# Patient Record
Sex: Male | Born: 1955
Health system: Southern US, Community
[De-identification: ages and names within clinical notes are randomized; demographics above are authoritative.]

## PROBLEM LIST (undated history)

## (undated) DIAGNOSIS — I519 Heart disease, unspecified: Secondary | ICD-10-CM

## (undated) DIAGNOSIS — R918 Other nonspecific abnormal finding of lung field: Secondary | ICD-10-CM

## (undated) DIAGNOSIS — K625 Hemorrhage of anus and rectum: Secondary | ICD-10-CM

## (undated) DIAGNOSIS — M170 Bilateral primary osteoarthritis of knee: Secondary | ICD-10-CM

## (undated) DIAGNOSIS — G5622 Lesion of ulnar nerve, left upper limb: Secondary | ICD-10-CM

## (undated) DIAGNOSIS — M109 Gout, unspecified: Secondary | ICD-10-CM

## (undated) DIAGNOSIS — E7800 Pure hypercholesterolemia, unspecified: Secondary | ICD-10-CM

## (undated) DIAGNOSIS — J45909 Unspecified asthma, uncomplicated: Secondary | ICD-10-CM

## (undated) DIAGNOSIS — K219 Gastro-esophageal reflux disease without esophagitis: Secondary | ICD-10-CM

## (undated) DIAGNOSIS — M7020 Olecranon bursitis, unspecified elbow: Secondary | ICD-10-CM

## (undated) DIAGNOSIS — K4020 Bilateral inguinal hernia, without obstruction or gangrene, not specified as recurrent: Secondary | ICD-10-CM

## (undated) DIAGNOSIS — M5410 Radiculopathy, site unspecified: Secondary | ICD-10-CM

## (undated) DIAGNOSIS — M1990 Unspecified osteoarthritis, unspecified site: Secondary | ICD-10-CM

## (undated) DIAGNOSIS — G4700 Insomnia, unspecified: Secondary | ICD-10-CM

## (undated) DIAGNOSIS — E119 Type 2 diabetes mellitus without complications: Secondary | ICD-10-CM

## (undated) DIAGNOSIS — Z87898 Personal history of other specified conditions: Secondary | ICD-10-CM

## (undated) HISTORY — DX: Bilateral primary osteoarthritis of knee: M17.0

## (undated) HISTORY — DX: Heart disease, unspecified: I51.9

## (undated) HISTORY — PX: JOINT REPLACEMENT: SHX530

## (undated) HISTORY — PX: EYE SURGERY: SHX253

## (undated) HISTORY — DX: Insomnia, unspecified: G47.00

## (undated) HISTORY — DX: Olecranon bursitis, unspecified elbow: M70.20

## (undated) HISTORY — DX: Radiculopathy, site unspecified: M54.10

## (undated) HISTORY — DX: Pure hypercholesterolemia, unspecified: E78.00

## (undated) HISTORY — PX: COLONOSCOPY: SHX174

## (undated) HISTORY — DX: Lesion of ulnar nerve, left upper limb: G56.22

## (undated) HISTORY — DX: Other nonspecific abnormal finding of lung field: R91.8

## (undated) HISTORY — DX: Bilateral inguinal hernia, without obstruction or gangrene, not specified as recurrent: K40.20

## (undated) HISTORY — DX: Hemorrhage of anus and rectum: K62.5

## (undated) HISTORY — PX: UPPER GI ENDOSCOPY: SHX6162

---

## 1998-09-29 ENCOUNTER — Emergency Department (HOSPITAL_COMMUNITY): Admission: EM | Admit: 1998-09-29 | Discharge: 1998-09-30 | Payer: Self-pay | Admitting: Emergency Medicine

## 1998-09-29 ENCOUNTER — Encounter: Payer: Self-pay | Admitting: Emergency Medicine

## 1999-02-03 ENCOUNTER — Emergency Department (HOSPITAL_COMMUNITY): Admission: EM | Admit: 1999-02-03 | Discharge: 1999-02-03 | Payer: Self-pay | Admitting: Emergency Medicine

## 1999-02-16 ENCOUNTER — Emergency Department (HOSPITAL_COMMUNITY): Admission: EM | Admit: 1999-02-16 | Discharge: 1999-02-16 | Payer: Self-pay | Admitting: Emergency Medicine

## 2000-12-30 ENCOUNTER — Ambulatory Visit (HOSPITAL_COMMUNITY): Admission: RE | Admit: 2000-12-30 | Discharge: 2000-12-30 | Payer: Self-pay | Admitting: *Deleted

## 2001-08-12 ENCOUNTER — Emergency Department (HOSPITAL_COMMUNITY): Admission: EM | Admit: 2001-08-12 | Discharge: 2001-08-12 | Payer: Self-pay | Admitting: Emergency Medicine

## 2001-08-12 ENCOUNTER — Encounter: Payer: Self-pay | Admitting: Emergency Medicine

## 2002-11-27 ENCOUNTER — Emergency Department (HOSPITAL_COMMUNITY): Admission: EM | Admit: 2002-11-27 | Discharge: 2002-11-27 | Payer: Self-pay | Admitting: Emergency Medicine

## 2003-11-30 ENCOUNTER — Ambulatory Visit: Payer: Self-pay | Admitting: Endocrinology

## 2003-12-03 ENCOUNTER — Ambulatory Visit: Payer: Self-pay | Admitting: Endocrinology

## 2003-12-17 ENCOUNTER — Ambulatory Visit: Payer: Self-pay | Admitting: Gastroenterology

## 2003-12-18 ENCOUNTER — Ambulatory Visit: Payer: Self-pay | Admitting: Endocrinology

## 2003-12-24 ENCOUNTER — Ambulatory Visit: Payer: Self-pay | Admitting: Gastroenterology

## 2005-01-05 ENCOUNTER — Emergency Department (HOSPITAL_COMMUNITY): Admission: EM | Admit: 2005-01-05 | Discharge: 2005-01-05 | Payer: Self-pay | Admitting: Emergency Medicine

## 2005-12-28 ENCOUNTER — Emergency Department (HOSPITAL_COMMUNITY): Admission: EM | Admit: 2005-12-28 | Discharge: 2005-12-29 | Payer: Self-pay | Admitting: Emergency Medicine

## 2007-01-24 ENCOUNTER — Ambulatory Visit: Payer: Self-pay | Admitting: Endocrinology

## 2007-01-24 LAB — CONVERTED CEMR LAB
ALT: 33 units/L (ref 0–53)
AST: 23 units/L (ref 0–37)
Albumin: 4 g/dL (ref 3.5–5.2)
Alkaline Phosphatase: 47 units/L (ref 39–117)
BUN: 14 mg/dL (ref 6–23)
Basophils Absolute: 0 10*3/uL (ref 0.0–0.1)
Basophils Relative: 0.6 % (ref 0.0–1.0)
Bilirubin Urine: NEGATIVE
Bilirubin, Direct: 0.1 mg/dL (ref 0.0–0.3)
CO2: 26 meq/L (ref 19–32)
Calcium: 9.8 mg/dL (ref 8.4–10.5)
Chloride: 107 meq/L (ref 96–112)
Cholesterol: 211 mg/dL (ref 0–200)
Creatinine, Ser: 1 mg/dL (ref 0.4–1.5)
Direct LDL: 106.7 mg/dL
Eosinophils Absolute: 0.2 10*3/uL (ref 0.0–0.6)
Eosinophils Relative: 2.5 % (ref 0.0–5.0)
GFR calc Af Amer: 102 mL/min
GFR calc non Af Amer: 84 mL/min
Glucose, Bld: 94 mg/dL (ref 70–99)
HCT: 43.8 % (ref 39.0–52.0)
HDL: 21.8 mg/dL — ABNORMAL LOW (ref >39.0)
Hemoglobin, Urine: NEGATIVE
Hemoglobin: 15.3 g/dL (ref 13.0–17.0)
Ketones, ur: NEGATIVE mg/dL
Leukocytes, UA: NEGATIVE
Lymphocytes Relative: 28.7 % (ref 12.0–46.0)
MCHC: 34.9 g/dL (ref 30.0–36.0)
MCV: 89.4 fL (ref 78.0–100.0)
Monocytes Absolute: 0.5 10*3/uL (ref 0.2–0.7)
Monocytes Relative: 6.3 % (ref 3.0–11.0)
Neutro Abs: 4.6 10*3/uL (ref 1.4–7.7)
Neutrophils Relative %: 61.9 % (ref 43.0–77.0)
Nitrite: NEGATIVE
Platelets: 302 10*3/uL (ref 150–400)
Potassium: 3.9 meq/L (ref 3.5–5.1)
RBC: 4.9 M/uL (ref 4.22–5.81)
RDW: 12.1 % (ref 11.5–14.6)
Sodium: 140 meq/L (ref 135–145)
Specific Gravity, Urine: >=1.03 (ref 1.000–1.03)
TSH: 2.38 microintl units/mL (ref 0.35–5.50)
Total Bilirubin: 0.5 mg/dL (ref 0.3–1.2)
Total CHOL/HDL Ratio: 9.7
Total Protein, Urine: NEGATIVE mg/dL
Total Protein: 7.5 g/dL (ref 6.0–8.3)
Triglycerides: 537 mg/dL (ref 0–149)
Urine Glucose: NEGATIVE mg/dL
Urobilinogen, UA: 0.2 (ref 0.0–1.0)
VLDL: 107 mg/dL — ABNORMAL HIGH (ref 0–40)
WBC: 7.4 10*3/uL (ref 4.5–10.5)
pH: 5.5 (ref 5.0–8.0)

## 2007-01-26 ENCOUNTER — Ambulatory Visit: Payer: Self-pay | Admitting: Endocrinology

## 2007-01-26 DIAGNOSIS — E7800 Pure hypercholesterolemia, unspecified: Secondary | ICD-10-CM

## 2007-01-26 DIAGNOSIS — I259 Chronic ischemic heart disease, unspecified: Secondary | ICD-10-CM

## 2007-01-26 DIAGNOSIS — M5412 Radiculopathy, cervical region: Secondary | ICD-10-CM | POA: Insufficient documentation

## 2007-01-26 DIAGNOSIS — G4700 Insomnia, unspecified: Secondary | ICD-10-CM

## 2007-02-03 ENCOUNTER — Encounter: Payer: Self-pay | Admitting: Internal Medicine

## 2007-02-03 ENCOUNTER — Ambulatory Visit: Payer: Self-pay

## 2007-03-13 ENCOUNTER — Ambulatory Visit: Payer: Self-pay | Admitting: Cardiology

## 2007-03-13 ENCOUNTER — Inpatient Hospital Stay (HOSPITAL_COMMUNITY): Admission: EM | Admit: 2007-03-13 | Discharge: 2007-03-15 | Payer: Self-pay | Admitting: Emergency Medicine

## 2007-03-13 ENCOUNTER — Ambulatory Visit: Payer: Self-pay | Admitting: Internal Medicine

## 2007-04-12 ENCOUNTER — Encounter: Payer: Self-pay | Admitting: Endocrinology

## 2007-04-25 ENCOUNTER — Ambulatory Visit: Payer: Self-pay | Admitting: Endocrinology

## 2007-04-25 DIAGNOSIS — R079 Chest pain, unspecified: Secondary | ICD-10-CM | POA: Insufficient documentation

## 2007-04-30 ENCOUNTER — Emergency Department (HOSPITAL_COMMUNITY): Admission: EM | Admit: 2007-04-30 | Discharge: 2007-05-01 | Payer: Self-pay | Admitting: Emergency Medicine

## 2007-05-03 ENCOUNTER — Ambulatory Visit: Payer: Self-pay | Admitting: Internal Medicine

## 2007-06-14 ENCOUNTER — Ambulatory Visit: Payer: Self-pay | Admitting: Endocrinology

## 2007-06-15 LAB — CONVERTED CEMR LAB
BUN: 12 mg/dL (ref 6–23)
CO2: 27 meq/L (ref 19–32)
Calcium: 9.7 mg/dL (ref 8.4–10.5)
Chloride: 107 meq/L (ref 96–112)
Creatinine, Ser: 0.9 mg/dL (ref 0.4–1.5)
GFR calc Af Amer: 115 mL/min
GFR calc non Af Amer: 95 mL/min
Glucose, Bld: 122 mg/dL — ABNORMAL HIGH (ref 70–99)
Potassium: 4 meq/L (ref 3.5–5.1)
Sodium: 141 meq/L (ref 135–145)

## 2007-12-13 ENCOUNTER — Encounter: Payer: Self-pay | Admitting: Endocrinology

## 2007-12-22 ENCOUNTER — Encounter: Payer: Self-pay | Admitting: Endocrinology

## 2008-02-14 ENCOUNTER — Encounter: Payer: Self-pay | Admitting: Endocrinology

## 2008-03-30 ENCOUNTER — Encounter: Payer: Self-pay | Admitting: Endocrinology

## 2008-04-26 ENCOUNTER — Ambulatory Visit: Payer: Self-pay | Admitting: Endocrinology

## 2008-04-26 DIAGNOSIS — M109 Gout, unspecified: Secondary | ICD-10-CM

## 2008-04-26 DIAGNOSIS — K625 Hemorrhage of anus and rectum: Secondary | ICD-10-CM

## 2008-04-29 LAB — CONVERTED CEMR LAB
ALT: 47 units/L (ref 0–53)
AST: 42 units/L — ABNORMAL HIGH (ref 0–37)
Alkaline Phosphatase: 56 units/L (ref 39–117)
Basophils Relative: 0.8 % (ref 0.0–3.0)
Bilirubin, Direct: 0 mg/dL (ref 0.0–0.3)
Chloride: 106 meq/L (ref 96–112)
Cholesterol: 171 mg/dL (ref 0–200)
Creatinine, Ser: 1 mg/dL (ref 0.4–1.5)
Direct LDL: 92.6 mg/dL
Eosinophils Relative: 2.7 % (ref 0.0–5.0)
GFR calc non Af Amer: 101.35 mL/min (ref >60)
HDL: 22.5 mg/dL — ABNORMAL LOW (ref >39.00)
Ketones, ur: NEGATIVE mg/dL
Lymphocytes Relative: 28.1 % (ref 12.0–46.0)
MCV: 89.7 fL (ref 78.0–100.0)
Monocytes Absolute: 0.4 10*3/uL (ref 0.1–1.0)
Monocytes Relative: 5.6 % (ref 3.0–12.0)
Neutrophils Relative %: 62.8 % (ref 43.0–77.0)
RBC: 4.57 M/uL (ref 4.22–5.81)
Specific Gravity, Urine: 1.025 (ref 1.000–1.030)
Total Bilirubin: 0.5 mg/dL (ref 0.3–1.2)
Total CHOL/HDL Ratio: 8
Total Protein, Urine: NEGATIVE mg/dL
Total Protein: 7.3 g/dL (ref 6.0–8.3)
Urine Glucose: NEGATIVE mg/dL
VLDL: 77.2 mg/dL — ABNORMAL HIGH (ref 0.0–40.0)
WBC: 7 10*3/uL (ref 4.5–10.5)
pH: 5.5 (ref 5.0–8.0)

## 2008-05-08 ENCOUNTER — Ambulatory Visit: Payer: Self-pay | Admitting: Endocrinology

## 2008-06-07 ENCOUNTER — Encounter: Payer: Self-pay | Admitting: Endocrinology

## 2008-06-26 ENCOUNTER — Encounter (INDEPENDENT_AMBULATORY_CARE_PROVIDER_SITE_OTHER): Payer: Self-pay | Admitting: *Deleted

## 2010-01-25 ENCOUNTER — Emergency Department (HOSPITAL_COMMUNITY)
Admission: EM | Admit: 2010-01-25 | Discharge: 2010-01-25 | Payer: Self-pay | Source: Home / Self Care | Admitting: Emergency Medicine

## 2010-01-28 ENCOUNTER — Encounter: Payer: Self-pay | Admitting: Endocrinology

## 2010-02-09 ENCOUNTER — Encounter: Payer: Self-pay | Admitting: Endocrinology

## 2010-02-20 NOTE — Letter (Signed)
Summary: Sports [facility]  [facility]   Imported By: Sherian Rein 02/05/2010 11:09:43  _____________________________________________________________________  External Attachment:    Type:   Image     Comment:   External Document

## 2010-06-03 NOTE — Discharge Summary (Signed)
David Wall, David Wall                 ACCOUNT NO.:  [id number]   MEDICAL RECORD NO.:  [id number]          PATIENT TYPE:  INP   LOCATION:  1432                         FACILITY:  [facility]   PHYSICIAN:  Rosalyn Gess. Norins, MD  DATE OF BIRTH:  [birth year]   DATE OF ADMISSION:  03/13/2007  DATE OF DISCHARGE:  03/15/2007                               DISCHARGE SUMMARY   ADMITTING DIAGNOSIS:  Atypical chest pain.   DISCHARGE DIAGNOSIS:  Atypical chest pain.   CONSULTANTS:  Dr. Antoine Poche from cardiology.   PROCEDURES:  1. 12-lead EKG which was normal.  2. Two views of the chest performed February 22 which showed no      evidence of acute cardiopulmonary disease.   HISTORY OF PRESENT ILLNESS:  The patient is a 55 year old gentleman who  presented to the emergency department complaining of the onset of chest  pain that began several hours prior to his admission.  He reports the  intensity was 5 on a scale of 10.  The patient reported pain at rest.  Denied exertional chest pain, nausea, vomiting, diaphoresis, shortness  of breath, palpitations, dizziness, lightheadedness, syncope or  indigestion.  The pain was described as sharp located in the substernal  and left anterior chest wall and left upper chest wall.  There was some  radiation to the substernal area.  Because of his ongoing pain and  discomfort he was brought to the emergency department for evaluation and  subsequently admitted to rule out for an MI.  Of note while in the  emergency department his point of care cardiac enzymes were positive  with first cardiac marker with CK of 14.5.  Troponin was less than 0.5.  Second cardiac marker point of care with a CK-MB of 13.9.  Troponin  remaining negative.  The patient had a cardiac panel performed on  February 22 at 1400 hours with a creatinine kinase of 765 with CK-MB of  13.5 with a relative index of 1.8 which was in normal range.  Troponin  was negative.  In addition the patient had  D-dimer 0.24 being negative.  Because of the patient's atypical chest pain and his elevated CK-MB  fraction, I did ask for cardiology consultation.   The patient has a positive family history for heart disease.  He is  obese.  He is a male.  He is not diabetic.  He is not hypertensive.  The  patient did have cardiac catheterization in 2002 which was read out as  showing trivial disease.  He had a nuclear stress test in 2002 which  showed reversible ischemic defect in the inferior wall.  He had a repeat  study in December 2008 which showed similar findings of a reversible  defect in the inferior wall and was unchanged from previous study.  Given the absence of change in recent nuclear stress study, the atypical  nature of his chest pain, his moderate cardiac risk profile, and  clearance by cardiology he was felt at this point to have ruled out and  to be stable for discharge home.   Please see the  Past Medical History, Family History, and Social History  documented in the admit note and consult note.   DISCHARGE PHYSICAL EXAMINATION:  VITAL SIGNS:  Temperature 98.1, blood  pressure 121/72, heart rate 66, respirations 20, O2 sats 96% on room  air.  GENERAL APPEARANCE:  This is an obese African-American gentleman  sitting up in a chair at the time of examination in no distress.  CHEST:  Patient is moving air well with no rales, wheezes or rhonchi.  He has some chest wall discomfort to palpation under the left breast and  to the anterior axillary line.  CARDIOVASCULAR:  The patient had a quiet precordium.  He had a regular  rate and rhythm without murmurs, rubs or gallops.   FINAL LABORATORY STUDIES:  Lipid profile was performed with a  cholesterol 187, triglycerides 304, HDL 20, LDL 106.  Cardiac markers as  noted.  Basic metabolic panel was significant for a serum glucose 118;  otherwise, was normal.  CBC was normal.  The patient had a directly  measured LDL, cholesterol 106.7.    DISCHARGE MEDICATIONS:  The patient will continue on his home medication  of Prilosec 40 mg daily for GERD.   RISK MODIFICATION:  The patient was encouraged to consider weight loss  program as one of his primary risk factors.  His low HDL is an  independent risk factor and can be addressed by his primary care  physician.  His LDL at 106 given his moderate cardiac risk profile is  acceptable.  The patient does have a mildly elevated serum glucose, and  this will be addressed by his primary care physician.   CONDITION AT TIME OF DISCHARGE DICTATION:  Stable and improved.      Rosalyn Gess Norins, MD  Electronically Signed     MEN/MEDQ  D:  03/15/2007  T:  03/15/2007  Job:  [id number]   cc:   Gregary Signs A. Everardo All, MD  [address]  [place]  [place] [address]

## 2010-06-03 NOTE — Consult Note (Signed)
David Wall, David Wall                 ACCOUNT NO.:  [id number]   MEDICAL RECORD NO.:  [id number]          PATIENT TYPE:  INP   LOCATION:  1432                         FACILITY:  [facility]   PHYSICIAN:  Rollene Rotunda, MD, FACCDATE OF BIRTH:  [birth year]   DATE OF CONSULTATION:  03/14/2007  DATE OF DISCHARGE:                                 CONSULTATION   REFERRING PHYSICIAN:  Valetta Mole. Swords, MD.   REASON FOR CONSULTATION:  Evaluate patient with chest pain.   HISTORY OF PRESENT ILLNESS:  The patient is a pleasant, 55 year old,  African American gentleman.  He had been doing well.  He did do a lot of  screaming this weekend at his son's basketball game.  Sunday, he went to  the bathroom.  When he was done, he had developed some sharp chest  discomfort.  He waited about 30 minutes to see if it would go away.  It  was worse with deep breathing.  It was associated with mild shortness of  breath that radiated somewhat to his back and left chest.  It was worse  with movement.  He was driven to the ER.  He would have the pain  somewhat improved with nitroglycerin, but it would come back.  It is  sharp.  He may have had this intermittently at times in the past.  He  was noted not to have any acute EKG changes.  However, he was noted to  have an elevated CK with peak thus far 4074, however, his MB is only  14.5.  Troponins x3 have been negative.  D-dimer was negative.  He  continues to have this discomfort with movement and deep breathing, but  there has been no fevers, chills or cough.  Prior to this, he had not  been having any chest discomfort, neck or arm discomfort.  He did not  complain of any palpitation, presyncope or syncope.  He had been having  no PND or orthopnea.   Of note, the patient had a cardiac catheterization in 2002, with trivial  coronary disease.  Prior to that, he had a stress perfusion study  suggesting a mild reversible inferior defect.  He had another stress  perfusion  study prior to his plans to exercise.  That was this January.  There was no change from the previous Cardiolite.  There was a  suggestion of an inferior reversible defect.  He had a well-preserved  ejection fraction.  Again, this was commented on that this was the same  appearance that he had at the time of this catheterization with  nonobstructive disease.  He was managed medically.   PAST MEDICAL HISTORY:  1. Gastroesophageal reflux disease.  2. Obesity.   PAST SURGICAL HISTORY:  None.   ALLERGIES:  PENICILLIN AND ASPIRIN.   MEDICATIONS:  Protonix, low-molecular heparin, metoprolol 25 mg b.i.d.   SOCIAL HISTORY:  The patient lives in [place] with his wife.  He  works in [employer].  He is married.  He does not smoke cigarettes.  Rarely drinks alcohol.   FAMILY HISTORY:  Contributory for his father  dying of myocardial  function at age 32.   REVIEW OF SYSTEMS:  As stated in HPI and negative for all other systems.   PHYSICAL EXAMINATION:  GENERAL:  The patient is well-appearing in no  distress.  VITAL SIGNS:  Blood pressure 112/74, heart rate 67 and regular,  afebrile.  HEENT:  Eyelids unremarkable, pupils equal, round, reactive to light,  fundi not visualized, oral mucosa unremarkable.  NECK:  No jugular venous distention at 45 degrees, carotid upstroke  brisk and symmetric.  No bruits, no thyromegaly.  LYMPHATICS:  No cervical, axillary or inguinal adenopathy.  LUNGS:  Clear to auscultation bilaterally.  BACK:  No costovertebral mass.  CHEST:  Unremarkable.  HEART:  PMI not displaced or sustained, S1 and S2 within normal limits,  no S3, S4, no clicks, rubs, no murmurs.  ABDOMEN:  Obese, positive bowel sounds, normal in frequency and pitch,  no bruits, rebound, guarding or midline pulsatile mass, no hepatomegaly  or splenomegaly.  SKIN:  No rashes, no nodules.  EXTREMITIES:  2+ pulses throughout, no edema, no cyanosis or clubbing.  NEUROLOGIC:  Oriented to person,  place and time.  Cranial nerves 2-12  grossly intact, motor grossly intact throughout.   EKG sinus rhythm, rate 62, axis within normal limits, early transition  in lead V2, no acute ST and T-wave changes.   ASSESSMENT/RECOMMENDATIONS:  1. The patient has chest discomfort that is extremely atypical for a      cardiac etiology.  It is reproducible with movement, deep      inspiration and cough.  It is reproducible slightly with palpation.      He has an elevated CK, but his MB ratio is very low.  Troponins      have been negative.  EKG is nonacute.  Therefore, based on this, no      further cardiovascular testing is suggested.  He did have the      mildly abnormal Cardiolite in the past, however, this was managed      medically and not change from previous.  Therefore, I would not      suggest that he needs further invasive evaluation.  2. Dyslipidemia, per his primary care team.  Given his family history,      I would aggressively manage this and try for an LDL less than 100      and HDL greater than 40.  3. Obesity.  I had a long discussion about the need to lose weight      with diet and exercise.  I suggested the [employer] and      calorie counting.  4. Reflux.  He is continuing his proton pump inhibitor.  5. Elevated CK.  He must have had some kind of a muscular trauma or      inflammation that would go together with the symptoms and the      elevated CK.  The etiology is not clear.  I will defer to his      primary care doctors.      Rollene Rotunda, MD, [facility]  Electronically Signed     JH/MEDQ  D:  03/14/2007  T:  03/15/2007  Job:  [id number]

## 2010-06-06 NOTE — Cardiovascular Report (Signed)
[place]  Patient:    David Wall, David Wall Visit Number: [id number] MRN: [id number]          Service Type: Attending:  Veneda Melter, M.D. [facility] Dictated by:   Veneda Melter, M.D. [facility] Proc. Date: 12/30/00   CC:         Sonda Primes, M.D. [facility]   Cardiac Catheterization  PROCEDURES PERFORMED: 1. Left heart catheterization. 2. Left ventriculogram. 3. Selective coronary angiography. 4. Perclose, right femoral artery.  DIAGNOSES: 1. Trivial coronary artery disease by angiogram. 2. Normal left ventricular systolic function.  HISTORY:  David Wall is a 55 year old black gentleman with a one-year history of substernal chest discomfort.  This has been associated with shortness of breath.  He underwent stress imaging study suggesting ischemia in the basal and mid inferior wall.  He presents now for further assessment.  DESCRIPTION OF PROCEDURE:  Informed consent was obtained.  The patient was brought to the catheterization lab.  A 6-French sheath was placed in the right femoral artery.  Left heart catheterization, left ventriculogram, and selective coronary angiography were then performed in the usual fashion using preformed 6-French Judkins catheters.  At the termination of the case, the Perclose suture closure device was deployed to the right femoral artery. Adequate hemostasis was achieved and the patient was transferred to the floor in stable condition.  He tolerated the procedure well.  Findings are as follows:  FINDINGS: 1. Left main trunk:  Medium caliber vessel, angiographically normal. 2. LAD:  This is a large caliber vessel that provides two major diagonal    branches and several small diagonal branches in the mid section.  The LAD    has mild irregularities of 20% in the mid section. 3. Left circumflex artery:  Medium caliber vessel that provides a large first    marginal branch in the mid section and a smaller second marginal branch    distally.   The left circumflex system has luminal irregularities. 4. Right coronary artery:  Dominant.  This is a medium caliber vessel that    provides a small posterior descending artery and a posterior ventricular    branch in its terminal segment.  There are mild irregularities of 20% in    the mid right coronary artery. 5. LV:  Normal end systolic and end diastolic dimensions.  Overall left    ventricular function is well preserved.  Ejection fraction is greater than    60%.  No mitral regurgitation.  LV pressure 117/10.  Aortic was 117/82.    LVEDP equals 20.  ASSESSMENT AND PLAN:  David Wall is a 55 year old gentleman with trivial coronary artery disease and well-preserved left ventricular function.  Medical therapy and risk factor modification for the patients coronary disease will be pursued and other causes of chest pain investigated. Dictated by:   Veneda Melter, M.D. [facility] Attending:  Veneda Melter, M.D. [facility] DD:  12/30/00 TD:  12/30/00 Job: [id number]

## 2010-10-10 LAB — BASIC METABOLIC PANEL
BUN: 20
Calcium: 8.9
Chloride: 105
Creatinine, Ser: 1.11
GFR calc Af Amer: >60
GFR calc non Af Amer: >60

## 2010-10-10 LAB — CK TOTAL AND CKMB (NOT AT ARMC)
CK, MB: 13.5 — ABNORMAL HIGH
Total CK: 765 — ABNORMAL HIGH

## 2010-10-10 LAB — LIPID PANEL
Cholesterol: 187
HDL: 20 — ABNORMAL LOW
LDL Cholesterol: 106 — ABNORMAL HIGH
Total CHOL/HDL Ratio: 9.4
Triglycerides: 304 — ABNORMAL HIGH
VLDL: 61 — ABNORMAL HIGH

## 2010-10-10 LAB — DIFFERENTIAL
Eosinophils Absolute: 0.2
Lymphocytes Relative: 19
Lymphs Abs: 1.3
Monocytes Relative: 7
Neutro Abs: 4.9
Neutrophils Relative %: 71

## 2010-10-10 LAB — CBC
Platelets: 257
RBC: 4.37
WBC: 6.9

## 2010-10-10 LAB — POCT CARDIAC MARKERS
CKMB, poc: 13.9
CKMB, poc: 14.5
Myoglobin, poc: 155
Myoglobin, poc: 205
Operator id: 4074
Operator id: 4074
Troponin i, poc: <0.05
Troponin i, poc: <0.05

## 2010-10-10 LAB — PROTIME-INR
INR: 1.1
Prothrombin Time: 14.6

## 2010-10-10 LAB — APTT: aPTT: 43 — ABNORMAL HIGH

## 2010-10-10 LAB — D-DIMER, QUANTITATIVE: D-Dimer, Quant: 0.24

## 2010-10-10 LAB — TROPONIN I: Troponin I: 0.01

## 2010-10-14 LAB — DIFFERENTIAL
Basophils Absolute: 0.1
Basophils Relative: 1
Eosinophils Absolute: 0.2
Eosinophils Relative: 2
Neutrophils Relative %: 69

## 2010-10-14 LAB — COMPREHENSIVE METABOLIC PANEL
ALT: 44
AST: 34
CO2: 24
Chloride: 104
GFR calc Af Amer: >60
GFR calc non Af Amer: >60
Glucose, Bld: 100 — ABNORMAL HIGH
Sodium: 136
Total Bilirubin: 0.7

## 2010-10-14 LAB — CK TOTAL AND CKMB (NOT AT ARMC)
CK, MB: 13 — ABNORMAL HIGH
Relative Index: 2.4

## 2010-10-14 LAB — TROPONIN I: Troponin I: 0.01

## 2010-10-14 LAB — CBC
Hemoglobin: 12.8 — ABNORMAL LOW
MCV: 89.3
RBC: 4.14 — ABNORMAL LOW
WBC: 8.7

## 2010-10-14 LAB — D-DIMER, QUANTITATIVE: D-Dimer, Quant: 0.28

## 2010-10-14 LAB — POCT CARDIAC MARKERS: CKMB, poc: 10.2

## 2012-10-24 ENCOUNTER — Emergency Department (HOSPITAL_COMMUNITY)
Admission: EM | Admit: 2012-10-24 | Discharge: 2012-10-24 | Disposition: A | Discharge status: Home / Self Care | Payer: 59 | Attending: Emergency Medicine | Admitting: Emergency Medicine

## 2012-10-24 ENCOUNTER — Emergency Department (HOSPITAL_COMMUNITY): Payer: 59

## 2012-10-24 ENCOUNTER — Encounter (HOSPITAL_COMMUNITY): Payer: Self-pay | Admitting: *Deleted

## 2012-10-24 DIAGNOSIS — M25422 Effusion, left elbow: Secondary | ICD-10-CM

## 2012-10-24 DIAGNOSIS — Z880 Allergy status to penicillin: Secondary | ICD-10-CM | POA: Insufficient documentation

## 2012-10-24 DIAGNOSIS — M25522 Pain in left elbow: Secondary | ICD-10-CM

## 2012-10-24 DIAGNOSIS — M25429 Effusion, unspecified elbow: Secondary | ICD-10-CM | POA: Insufficient documentation

## 2012-10-24 DIAGNOSIS — Z8639 Personal history of other endocrine, nutritional and metabolic disease: Secondary | ICD-10-CM | POA: Insufficient documentation

## 2012-10-24 DIAGNOSIS — Z862 Personal history of diseases of the blood and blood-forming organs and certain disorders involving the immune mechanism: Secondary | ICD-10-CM | POA: Insufficient documentation

## 2012-10-24 LAB — CBC WITH DIFFERENTIAL/PLATELET
Basophils Relative: 0 % (ref 0–1)
Eosinophils Absolute: 0.1 10*3/uL (ref 0.0–0.7)
Eosinophils Relative: 1 % (ref 0–5)
HCT: 36.7 % — ABNORMAL LOW (ref 39.0–52.0)
Hemoglobin: 12.8 g/dL — ABNORMAL LOW (ref 13.0–17.0)
Lymphs Abs: 1.6 10*3/uL (ref 0.7–4.0)
MCH: 30.5 pg (ref 26.0–34.0)
MCHC: 34.9 g/dL (ref 30.0–36.0)
MCV: 87.6 fL (ref 78.0–100.0)
Monocytes Absolute: 0.6 10*3/uL (ref 0.1–1.0)
Monocytes Relative: 5 % (ref 3–12)
Neutrophils Relative %: 78 % — ABNORMAL HIGH (ref 43–77)
RBC: 4.19 MIL/uL — ABNORMAL LOW (ref 4.22–5.81)

## 2012-10-24 LAB — SYNOVIAL CELL COUNT + DIFF, W/ CRYSTALS
Crystals, Fluid: NONE SEEN
Eosinophils-Synovial: 0 % (ref 0–1)
Neutrophil, Synovial: 91 % — ABNORMAL HIGH (ref 0–25)
WBC, Synovial: 37600 /mm3 — ABNORMAL HIGH (ref 0–200)

## 2012-10-24 LAB — BASIC METABOLIC PANEL
BUN: 17 mg/dL (ref 6–23)
CO2: 21 mEq/L (ref 19–32)
Calcium: 8.8 mg/dL (ref 8.4–10.5)
Creatinine, Ser: 0.92 mg/dL (ref 0.50–1.35)
GFR calc non Af Amer: >90 mL/min (ref >90)
Glucose, Bld: 123 mg/dL — ABNORMAL HIGH (ref 70–99)
Sodium: 138 mEq/L (ref 135–145)

## 2012-10-24 LAB — GRAM STAIN

## 2012-10-24 MED ORDER — HYDROCODONE-ACETAMINOPHEN 5-325 MG PO TABS
2.0000 | ORAL_TABLET | Freq: Once | ORAL | Status: AC
  Administered 2012-10-24: 2 via ORAL
  Filled 2012-10-24: qty 2

## 2012-10-24 MED ORDER — ALLOPURINOL 100 MG PO TABS: 100.0000 mg | ORAL_TABLET | Freq: Two times a day (BID) | ORAL | Status: DC

## 2012-10-24 MED ORDER — PREDNISONE 20 MG PO TABS
60.0000 mg | ORAL_TABLET | Freq: Once | ORAL | Status: DC
  Filled 2012-10-24: qty 3

## 2012-10-24 MED ORDER — LIDOCAINE HCL (PF) 1 % IJ SOLN
30.0000 mL | Freq: Once | INTRAMUSCULAR | Status: DC
  Filled 2012-10-24: qty 30

## 2012-10-24 MED ORDER — COLCHICINE 0.6 MG PO TABS: 0.6000 mg | ORAL_TABLET | Freq: Two times a day (BID) | ORAL | Status: DC

## 2012-10-24 MED ORDER — HYDROCODONE-ACETAMINOPHEN 5-325 MG PO TABS: 2.0000 | ORAL_TABLET | ORAL | Status: DC | PRN

## 2012-10-24 NOTE — ED Notes (Signed)
MD at Bedside.

## 2012-10-24 NOTE — ED Provider Notes (Signed)
CSN: [id number]     Arrival date & time 10/24/12  0212 History   First MD Initiated Contact with Patient 10/24/12 [phone]     Chief Complaint  Patient presents with  . Arm Pain   (Consider location/radiation/quality/duration/timing/severity/associated sxs/prior Treatment) HPI Comments: 57 yo male with gout hx, non smoker presents with left elbow pain and swelling since this am, constant, ache, worse with movement, no hx of gout in elbow.  No fevers or warmth.  No DM hx.  No other joints involved.  No injuries.   Patient is a 57 y.o. male presenting with arm pain. The history is provided by the patient.  Arm Pain This is a new problem. Pertinent negatives include no chest pain, no abdominal pain, no headaches and no shortness of breath.    History reviewed. No pertinent past medical history. History reviewed. No pertinent past surgical history. History reviewed. No pertinent family history. History  Substance Use Topics  . Smoking status: Never Smoker   . Smokeless tobacco: Not on file  . Alcohol Use: Yes    Review of Systems  Constitutional: Negative for fever and chills.  HENT: Negative for neck pain.   Respiratory: Negative for shortness of breath.   Cardiovascular: Negative for chest pain.  Gastrointestinal: Negative for vomiting and abdominal pain.  Genitourinary: Negative for dysuria and flank pain.  Musculoskeletal: Positive for joint swelling and arthralgias. Negative for back pain.  Skin: Negative for rash.  Neurological: Negative for light-headedness and headaches.    Allergies  Amoxicillin; Aspirin; and Penicillins  Home Medications   Current Outpatient Rx  Name  Route  Sig  Dispense  Refill  . ibuprofen (ADVIL,MOTRIN) 200 MG tablet   Oral   Take 800 mg by mouth every 6 (six) hours as needed for pain.          BP 134/77  Pulse 80  Temp(Src) 98.7 F (37.1 C) (Oral)  Resp 16  SpO2 98% Physical Exam  Nursing note and vitals reviewed. Constitutional: He  is oriented to person, place, and time. He appears well-developed and well-nourished.  HENT:  Head: Normocephalic and atraumatic.  Eyes: Conjunctivae are normal. Right eye exhibits no discharge. Left eye exhibits no discharge.  Neck: Normal range of motion. Neck supple. No tracheal deviation present.  Cardiovascular: Normal rate and regular rhythm.   Pulmonary/Chest: Effort normal.  Abdominal: Soft.  Musculoskeletal: He exhibits edema and tenderness.  Tender and moderate swelling left elbow, no warmth or erythema or  induration, nv intact distal, pain with flexion No other major joints swollen  Neurological: He is alert and oriented to person, place, and time.  Skin: Skin is warm. No rash noted.  Psychiatric: He has a normal mood and affect.    ED Course  Fine needle aspiration Date/Time: 10/24/2012 6:40 AM Performed by: Enid Skeens Authorized by: Enid Skeens Consent: Verbal consent obtained. Consent given by: patient Patient identity confirmed: verbally with patient Local anesthesia used: yes Anesthesia: local infiltration Local anesthetic: lidocaine 1% without epinephrine Anesthetic total: 8 ml Patient sedated: no Comments: Left elbow joint aspiration, 8 ml obtained, yellow fluid   (including critical care time)  Left Elbow Joint aspiration Lidocaine 8 cc used. Region sterilized with betadine.  US guidances used. 10 cc of yellow fluid obtained.  Sent to lab EMERGENCY DEPARTMENT [place] MUSCULOSKELETAL  Left Elbow Indication Joint effusion/ pain Findings moderate joint effusion with linear probe Images saved  Emergency Ultrasound:  US Guidance for needle guidance  Performed by  Dr. Jodi Mourning Indication: left elbow effusion/ aspiration Linear probe used in real-time to visualize location of needle entry through skin. Interpretation: needle visualized going into joint effusion Image archived electronically.   Labs Review Labs Reviewed  SYNOVIAL CELL COUNT +  DIFF, W/ CRYSTALS - Abnormal; Notable for the following:    Color, Synovial RED (*)    Appearance-Synovial TURBID (*)    WBC, Synovial 16109 (*)    Neutrophil, Synovial 91 (*)    Monocyte-Macrophage-Synovial Fluid 9 (*)    All other components within normal limits  CBC WITH DIFFERENTIAL - Abnormal; Notable for the following:    RBC 4.19 (*)    Hemoglobin 12.8 (*)    HCT 36.7 (*)    Neutrophils Relative % 78 (*)    Neutro Abs 8.1 (*)    All other components within normal limits  BASIC METABOLIC PANEL - Abnormal; Notable for the following:    Glucose, Bld 123 (*)    All other components within normal limits  GRAM STAIN  BODY FLUID CULTURE  BODY FLUID CRYSTAL  SEDIMENTATION RATE   Imaging Review No results found.  MDM  No diagnosis found. Concern for gout vs less likely infectious. Xray and bedside US. Pt had motrin pta, norco given.  Pain improved, xray no fx, joint effusion, reviewed.  Discussed r/b of joint aspiration to look for gout vs infectious, pt understands and wishes to proceed. US guidance utilized, two attempts, sterile technique. Fluid sent to lab, yellow color. Labs pending.   Spoke with ortho Dr Lajoyce Corners, low suspicion for septic joint, he recommended allopurinol/ colchicine and close fup in clinic.  DC Left elbow effusion, left elbow pain, Gout  Enid Skeens, MD 10/24/12 [phone]

## 2012-10-24 NOTE — ED Notes (Signed)
Labs are sent and should be in process

## 2012-10-24 NOTE — ED Notes (Signed)
Patient transported to XR. 

## 2012-10-24 NOTE — ED Notes (Signed)
Patient returned from XR. 

## 2012-10-24 NOTE — ED Notes (Addendum)
Pt states that he started having pain yesterday in his left lower arm. Pt states that he though it was a cramp from sleeping wrong but as the day progressed his pain continued to get worse. Pt states that when he moves his left lower arm his pain is worse. Pt denies pain in left shoulder. CNS intact with left arm. Pt able to wiggle fingers and cap refill less than 3

## 2012-10-27 LAB — BODY FLUID CULTURE: Culture: NO GROWTH

## 2013-01-26 ENCOUNTER — Emergency Department (HOSPITAL_BASED_OUTPATIENT_CLINIC_OR_DEPARTMENT_OTHER)
Admission: EM | Admit: 2013-01-26 | Discharge: 2013-01-26 | Disposition: A | Discharge status: Home / Self Care | Payer: 59 | Attending: Emergency Medicine | Admitting: Emergency Medicine

## 2013-01-26 ENCOUNTER — Encounter (HOSPITAL_BASED_OUTPATIENT_CLINIC_OR_DEPARTMENT_OTHER): Payer: Self-pay | Admitting: Emergency Medicine

## 2013-01-26 ENCOUNTER — Emergency Department (HOSPITAL_BASED_OUTPATIENT_CLINIC_OR_DEPARTMENT_OTHER): Payer: 59

## 2013-01-26 DIAGNOSIS — M7581 Other shoulder lesions, right shoulder: Secondary | ICD-10-CM

## 2013-01-26 DIAGNOSIS — Z880 Allergy status to penicillin: Secondary | ICD-10-CM | POA: Insufficient documentation

## 2013-01-26 DIAGNOSIS — M778 Other enthesopathies, not elsewhere classified: Secondary | ICD-10-CM

## 2013-01-26 DIAGNOSIS — Z79899 Other long term (current) drug therapy: Secondary | ICD-10-CM | POA: Insufficient documentation

## 2013-01-26 DIAGNOSIS — M109 Gout, unspecified: Secondary | ICD-10-CM | POA: Insufficient documentation

## 2013-01-26 DIAGNOSIS — M719 Bursopathy, unspecified: Principal | ICD-10-CM | POA: Insufficient documentation

## 2013-01-26 DIAGNOSIS — M67919 Unspecified disorder of synovium and tendon, unspecified shoulder: Secondary | ICD-10-CM | POA: Insufficient documentation

## 2013-01-26 HISTORY — DX: Gout, unspecified: M10.9

## 2013-01-26 MED ORDER — HYDROCODONE-ACETAMINOPHEN 5-325 MG PO TABS
1.0000 | ORAL_TABLET | Freq: Once | ORAL | Status: AC
Start: 2013-01-26 — End: 2013-01-26
  Administered 2013-01-26: 1 via ORAL
  Filled 2013-01-26: qty 1

## 2013-01-26 MED ORDER — HYDROCODONE-ACETAMINOPHEN 5-325 MG PO TABS: 2.0000 | ORAL_TABLET | Freq: Four times a day (QID) | ORAL | Status: DC | PRN

## 2013-01-26 NOTE — ED Provider Notes (Signed)
CSN: [id number]     Arrival date & time 01/26/13  0906 History   First MD Initiated Contact with Patient 01/26/13 [phone]     Chief Complaint  Patient presents with  . Shoulder Pain   (Consider location/radiation/quality/duration/timing/severity/associated sxs/prior Treatment) Patient is a 58 y.o. male presenting with shoulder pain.  Shoulder Pain   Pt reports 2 days of moderate to severe aching pain in R shoulder, worse with movement, not associated with injury. Felt like he slept on it wrong but not improving at home. Has history of gout but no prior shoulder problems. 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   Past Medical History  Diagnosis Date  . Gout    History reviewed. No pertinent past surgical history. No family history on file. History  Substance Use Topics  . Smoking status: Never Smoker   . Smokeless tobacco: Not on file  . Alcohol Use: Yes    Review of Systems All other systems reviewed and are negative except as noted in HPI.   Allergies  Amoxicillin; Aspirin; and Penicillins  Home Medications   Current Outpatient Rx  Name  Route  Sig  Dispense  Refill  . allopurinol (ZYLOPRIM) 100 MG tablet   Oral   Take 1 tablet (100 mg total) by mouth 2 (two) times daily.   14 tablet   0   . colchicine 0.6 MG tablet   Oral   Take 1 tablet (0.6 mg total) by mouth 2 (two) times daily.   14 tablet   0   . ibuprofen (ADVIL,MOTRIN) 200 MG tablet   Oral   Take 800 mg by mouth every 6 (six) hours as needed for pain.         Marland Kitchen HYDROcodone-acetaminophen (NORCO) 5-325 MG per tablet   Oral   Take 2 tablets by mouth every 4 (four) hours as needed for pain.   10 tablet   0    BP 126/85  Temp(Src) 98.3 F (36.8 C) (Oral)  Resp 20  SpO2 100% Physical Exam  Nursing note and vitals reviewed. Constitutional: He is oriented to person, place, and time. He appears well-developed and well-nourished.  HENT:  Head: Normocephalic and atraumatic.  Eyes: EOM  are normal. Pupils are equal, round, and reactive to light.  Neck: Normal range of motion. Neck supple.  Cardiovascular: Normal rate, normal heart sounds and intact distal pulses.   Pulmonary/Chest: Effort normal and breath sounds normal.  Abdominal: Bowel sounds are normal. He exhibits no distension. There is no tenderness.  Musculoskeletal: He exhibits tenderness (tender over the R AC joint). He exhibits no edema.  Pain with active ROM but less painful with passive ROM  Neurological: He is alert and oriented to person, place, and time. He has normal strength. No cranial nerve deficit or sensory deficit.  Skin: Skin is warm and dry. No rash noted.  Psychiatric: He has a normal mood and affect.    ED Course  Procedures (including critical care time) Labs Review Labs Reviewed - No data to display Imaging Review Dg Shoulder Right  01/26/2013   CLINICAL DATA:  Pain.  EXAM: RIGHT SHOULDER - 2+ VIEW  COMPARISON:  None.  FINDINGS: There is no evidence of fracture or dislocation. There is no evidence of arthropathy or other focal bone abnormality. Soft tissues are unremarkable. Punctate calcified density projects along the course of the distal supraspinous this has a a multislice appearance. Finding may represent sequela of calcific tendinitis etiologies such as  hydroxyapatite deposition also a diagnostic consideration.  IMPRESSION: No evidence of acute osseous abnormalities. Calcific appearing density projecting along the distal aspect of the supraspinatus differential as described above.   Electronically Signed   By: Margaree Mackintosh M.D.   On: 01/26/2013 09:40    EKG Interpretation   None       MDM   1. Tendonitis of shoulder, right     Shoulder pain without bony abnormality. Suspect tendonitis. Advised rest, NSAID, pain meds and sling for comfort. Advised ROM as tolerate to avoid frozen shoulder. Followup with Dr. Barbaraann Barthel.     [name], MD 01/26/13 [phone]

## 2013-01-26 NOTE — ED Notes (Signed)
Patient states he woke up two days ago with pain in his right shoulder.  No known injury.

## 2013-01-26 NOTE — Discharge Instructions (Signed)
Tendinitis °Tendinitis is swelling and inflammation of the tendons. Tendons are band-like tissues that connect muscle to bone. Tendinitis commonly occurs in the:  °· Shoulders (rotator cuff). °· Heels (Achilles tendon). °· Elbows (triceps tendon). °CAUSES °Tendinitis is usually caused by overusing the tendon, muscles, and joints involved. When the tissue surrounding a tendon (synovium) becomes inflamed, it is called tenosynovitis. Tendinitis commonly develops in people whose jobs require repetitive motions. °SYMPTOMS °· Pain. °· Tenderness. °· Mild swelling. °DIAGNOSIS °Tendinitis is usually diagnosed by physical exam. Your caregiver may also order X-rays or other imaging tests. °TREATMENT °Your caregiver may recommend certain medicines or exercises for your treatment. °HOME CARE INSTRUCTIONS  °· Use a sling or splint for as long as directed by your caregiver until the pain decreases. °· Put ice on the injured area. °· Put ice in a plastic bag. °· Place a towel between your skin and the bag. °· Leave the ice on for 15-20 minutes, 03-04 times a day. °· Avoid using the limb while the tendon is painful. Perform gentle range of motion exercises only as directed by your caregiver. Stop exercises if pain or discomfort increase, unless directed otherwise by your caregiver. °· Only take over-the-counter or prescription medicines for pain, discomfort, or fever as directed by your caregiver. °SEEK MEDICAL CARE IF:  °· Your pain and swelling increase. °· You develop new, unexplained symptoms, especially increased numbness in the hands. °MAKE SURE YOU:  °· Understand these instructions. °· Will watch your condition. °· Will get help right away if you are not doing well or get worse. °Document Released: 01/03/2000 Document Revised: 03/30/2011 Document Reviewed: 03/24/2010 °[employer]® Patient Information ©2014 [employer]. ° °

## 2013-01-30 ENCOUNTER — Emergency Department (HOSPITAL_BASED_OUTPATIENT_CLINIC_OR_DEPARTMENT_OTHER): Payer: 59

## 2013-01-30 ENCOUNTER — Encounter (HOSPITAL_BASED_OUTPATIENT_CLINIC_OR_DEPARTMENT_OTHER): Payer: Self-pay | Admitting: Emergency Medicine

## 2013-01-30 ENCOUNTER — Emergency Department (HOSPITAL_BASED_OUTPATIENT_CLINIC_OR_DEPARTMENT_OTHER)
Admission: EM | Admit: 2013-01-30 | Discharge: 2013-01-31 | Disposition: A | Discharge status: Home / Self Care | Payer: 59 | Attending: Emergency Medicine | Admitting: Emergency Medicine

## 2013-01-30 DIAGNOSIS — Z8639 Personal history of other endocrine, nutritional and metabolic disease: Secondary | ICD-10-CM | POA: Insufficient documentation

## 2013-01-30 DIAGNOSIS — M25469 Effusion, unspecified knee: Secondary | ICD-10-CM | POA: Insufficient documentation

## 2013-01-30 DIAGNOSIS — Z862 Personal history of diseases of the blood and blood-forming organs and certain disorders involving the immune mechanism: Secondary | ICD-10-CM | POA: Insufficient documentation

## 2013-01-30 DIAGNOSIS — M25561 Pain in right knee: Secondary | ICD-10-CM

## 2013-01-30 DIAGNOSIS — M25579 Pain in unspecified ankle and joints of unspecified foot: Secondary | ICD-10-CM | POA: Insufficient documentation

## 2013-01-30 DIAGNOSIS — Z880 Allergy status to penicillin: Secondary | ICD-10-CM | POA: Insufficient documentation

## 2013-01-30 LAB — BASIC METABOLIC PANEL
BUN: 10 mg/dL (ref 6–23)
CHLORIDE: 100 meq/L (ref 96–112)
CO2: 25 mEq/L (ref 19–32)
Calcium: 9.4 mg/dL (ref 8.4–10.5)
Creatinine, Ser: 1 mg/dL (ref 0.50–1.35)
GFR calc non Af Amer: 83 mL/min — ABNORMAL LOW (ref >90)
Glucose, Bld: 104 mg/dL — ABNORMAL HIGH (ref 70–99)
POTASSIUM: 4.5 meq/L (ref 3.7–5.3)
SODIUM: 139 meq/L (ref 137–147)

## 2013-01-30 LAB — CBC WITH DIFFERENTIAL/PLATELET
BASOS ABS: 0 10*3/uL (ref 0.0–0.1)
BASOS PCT: 0 % (ref 0–1)
Eosinophils Absolute: 0.1 10*3/uL (ref 0.0–0.7)
Eosinophils Relative: 1 % (ref 0–5)
HCT: 40.6 % (ref 39.0–52.0)
HEMOGLOBIN: 13.4 g/dL (ref 13.0–17.0)
Lymphocytes Relative: 12 % (ref 12–46)
Lymphs Abs: 1.3 10*3/uL (ref 0.7–4.0)
MCH: 29.3 pg (ref 26.0–34.0)
MCHC: 33 g/dL (ref 30.0–36.0)
MCV: 88.8 fL (ref 78.0–100.0)
MONOS PCT: 7 % (ref 3–12)
Monocytes Absolute: 0.8 10*3/uL (ref 0.1–1.0)
NEUTROS ABS: 8.9 10*3/uL — AB (ref 1.7–7.7)
Neutrophils Relative %: 81 % — ABNORMAL HIGH (ref 43–77)
PLATELETS: 306 10*3/uL (ref 150–400)
RBC: 4.57 MIL/uL (ref 4.22–5.81)
RDW: 12 % (ref 11.5–15.5)
WBC: 11 10*3/uL — ABNORMAL HIGH (ref 4.0–10.5)

## 2013-01-30 LAB — SEDIMENTATION RATE: SED RATE: 31 mm/h — AB (ref 0–16)

## 2013-01-30 MED ORDER — OXYCODONE-ACETAMINOPHEN 5-325 MG PO TABS
2.0000 | ORAL_TABLET | Freq: Once | ORAL | Status: AC
  Administered 2013-01-30: 2 via ORAL
  Filled 2013-01-30: qty 2

## 2013-01-30 NOTE — ED Notes (Signed)
Right knee pain x 2 days-denies injury

## 2013-01-30 NOTE — ED Notes (Signed)
Pt states 3 days ago felt a "cramping" feeling in right knee and slipped a bit, but denies falling or known injury to knee.  Pain described as throbbing sharp, constant pain, unrelieved by laying still or limited ROM.  Pt has taken tylenol at home with minimal relief.

## 2013-01-30 NOTE — ED Provider Notes (Signed)
CSN: [id number]     Arrival date & time 01/30/13  2004 History  This chart was scribed for Ephraim Hamburger, MD by Rolanda Lundborg, ED Scribe. This patient was seen in room MH03/MH03 and the patient's care was started at 8:33 PM.    Chief Complaint  Patient presents with  . Knee Pain   The history is provided by the patient. No language interpreter was used.   HPI Comments: David Wall is a 58 y.o. male with a h/o gout who presents to the Emergency Department complaining of gradually-worsening right knee pain with associated swelling onset 4 days ago. Pt denies injury, trauma. He has been taking Tylenol and icing the area. He states he usually gets gout in his foot. He rates the pain as an 8/10 severity. Standing up makes the pain worse. He denies fevers, vomiting.   Past Medical History  Diagnosis Date  . Gout    History reviewed. No pertinent past surgical history. No family history on file. History  Substance Use Topics  . Smoking status: Never Smoker   . Smokeless tobacco: Not on file  . Alcohol Use: Yes    Review of Systems  Constitutional: Negative for fever.  Gastrointestinal: Negative for vomiting.  Musculoskeletal: Positive for arthralgias (right knee).  All other systems reviewed and are negative.    Allergies  Amoxicillin; Aspirin; and Penicillins  Home Medications   Current Outpatient Rx  Name  Route  Sig  Dispense  Refill  . HYDROcodone-acetaminophen (NORCO/VICODIN) 5-325 MG per tablet   Oral   Take 2 tablets by mouth every 6 (six) hours as needed.   30 tablet   0   . ibuprofen (ADVIL,MOTRIN) 200 MG tablet   Oral   Take 800 mg by mouth every 6 (six) hours as needed for pain.          BP 112/75  Pulse 92  Temp(Src) 99.7 F (37.6 C) (Oral)  Resp 20  Ht 5\' 8"  (1.727 m)  Wt 275 lb (124.739 kg)  BMI 41.82 kg/m2  SpO2 96% Physical Exam  Nursing note and vitals reviewed. Constitutional: He is oriented to person, place, and time. He appears  well-developed and well-nourished. No distress.  HENT:  Head: Normocephalic and atraumatic.  Eyes: EOM are normal.  Cardiovascular: Normal rate.   Pulses:      Dorsalis pedis pulses are 2+ on the right side, and 2+ on the left side.  Pulmonary/Chest: Effort normal. No respiratory distress.  Abdominal: He exhibits no distension.  Musculoskeletal:       Right knee: He exhibits decreased range of motion and swelling. He exhibits no erythema. Tenderness found.  Warm knee but no erythema, NV intact distally  Neurological: He is alert and oriented to person, place, and time.  Skin: Skin is warm and dry. No erythema.  Psychiatric: He has a normal mood and affect. His behavior is normal.    ED Course  ARTHOCENTESIS Date/Time: 01/30/2013 10:27 PM Performed by: Sherwood Gambler T Authorized by: Sherwood Gambler T Consent: Verbal consent obtained. written consent obtained. Risks and benefits: risks, benefits and alternatives were discussed Consent given by: patient Patient identity confirmed: verbally with patient and arm band Time out: Immediately prior to procedure a "time out" was called to verify the correct patient, procedure, equipment, support staff and site/side marked as required. Indications: joint swelling, pain and possible septic joint  Body area: knee Joint: right knee Local anesthesia used: yes Local anesthetic: lidocaine 1% with epinephrine Anesthetic  total: 3 ml Patient sedated: no Preparation: Patient was prepped and draped in the usual sterile fashion. Needle gauge: 18 G Approach: lateral Aspirate: yellow Aspirate amount: 20 ml Patient tolerance: Patient tolerated the procedure well with no immediate complications.   (including critical care time) Medications  oxyCODONE-acetaminophen (PERCOCET/ROXICET) 5-325 MG per tablet 2 tablet (2 tablets Oral Given 01/30/13 2105)    DIAGNOSTIC STUDIES: Oxygen Saturation is 96% on RA, normal by my interpretation.     COORDINATION OF CARE: 8:49 PM- Discussed treatment plan with pt which includes x-ray and pain medication. Pt agrees to plan.    Labs Review Labs Reviewed  CBC WITH DIFFERENTIAL - Abnormal; Notable for the following:    WBC 11.0 (*)    Neutrophils Relative % 81 (*)    Neutro Abs 8.9 (*)    All other components within normal limits  BASIC METABOLIC PANEL - Abnormal; Notable for the following:    Glucose, Bld 104 (*)    GFR calc non Af Amer 83 (*)    All other components within normal limits  BODY FLUID CULTURE  GRAM STAIN  SYNOVIAL CELL COUNT + DIFF, W/ CRYSTALS  SEDIMENTATION RATE   Imaging Review Dg Knee Complete 4 Views Right  01/30/2013   CLINICAL DATA:  Right knee pain and swelling  EXAM: RIGHT KNEE - COMPLETE 4+ VIEW  COMPARISON:  None.  FINDINGS: No acute fracture.  No dislocation.  Mild degenerative change.  IMPRESSION: No acute bony pathology.   Electronically Signed   By: Maryclare Bean M.D.   On: 01/30/2013 21:42    EKG Interpretation   None       MDM   1. Right knee pain    Patient with atraumatic right knee pain. No erythema or overlying cellulitis. No complications of arthrocentesis as above. Patient tolerated well. His x-ray is benign. Will send off labs. His labs have to go to an outside facility due to our current facility, will transfer care to Dr. Nicholes Stairs, will followup on labs and disco accordingly.  I personally performed the services described in this documentation, which was scribed in my presence. The recorded information has been reviewed and is accurate.   Ephraim Hamburger, MD 01/30/13 2308

## 2013-01-31 LAB — SYNOVIAL CELL COUNT + DIFF, W/ CRYSTALS
CRYSTALS FLUID: NONE SEEN
Lymphocytes-Synovial Fld: 1 % (ref 0–20)
Monocyte-Macrophage-Synovial Fluid: 14 % — ABNORMAL LOW (ref 50–90)
Neutrophil, Synovial: 85 % — ABNORMAL HIGH (ref 0–25)
WBC, SYNOVIAL: 55009 /mm3 — AB (ref 0–200)

## 2013-01-31 MED ORDER — TRAMADOL HCL 50 MG PO TABS
ORAL_TABLET | ORAL | Status: AC
  Filled 2013-01-31: qty 1

## 2013-01-31 MED ORDER — TRAMADOL HCL 50 MG PO TABS
50.0000 mg | ORAL_TABLET | Freq: Once | ORAL | Status: AC
  Administered 2013-01-31: 50 mg via ORAL

## 2013-01-31 MED ORDER — HYDROCODONE-ACETAMINOPHEN 5-325 MG PO TABS: 1.0000 | ORAL_TABLET | Freq: Four times a day (QID) | ORAL | Status: DC | PRN

## 2013-01-31 MED ORDER — CLINDAMYCIN HCL 150 MG PO CAPS
300.0000 mg | ORAL_CAPSULE | Freq: Once | ORAL | Status: AC
  Administered 2013-01-31: 300 mg via ORAL
  Filled 2013-01-31: qty 2

## 2013-01-31 MED ORDER — CLINDAMYCIN HCL 300 MG PO CAPS
300.0000 mg | ORAL_CAPSULE | Freq: Four times a day (QID) | ORAL | Status: DC
Start: 2013-01-31 — End: 2013-10-10

## 2013-01-31 NOTE — ED Provider Notes (Signed)
207 am case d/w Dr. Marcelino Scot on call for ortho. Need gram stain results no yet resulted.  Start PO antibiotics have patient call office in am to be seen later today for definitive care will require wash out.  Dr. Marcelino Scot took patient's name and DOB.  Clindamycin started as patient is penicillin allergic patient informed of imperative to call when office opens to be seen same day.  Patient and wife verbalize understanding that it is imperative that they follow up immediately and agree to do so   No organisms on gram stain few polys called by [facility] lab with results   K -Rasch, MD 01/31/13 [phone]

## 2013-01-31 NOTE — ED Notes (Signed)
Patient requested pain medication/MD made aware

## 2013-01-31 NOTE — ED Notes (Signed)
Called Lab at [facility].  They have received the specimen and are running the specimen

## 2013-02-01 LAB — PATHOLOGIST SMEAR REVIEW

## 2013-02-03 LAB — BODY FLUID CULTURE: Culture: NO GROWTH

## 2013-05-30 ENCOUNTER — Emergency Department (HOSPITAL_COMMUNITY)
Admission: EM | Admit: 2013-05-30 | Discharge: 2013-05-30 | Disposition: A | Discharge status: Home / Self Care | Payer: 59 | Attending: Emergency Medicine | Admitting: Emergency Medicine

## 2013-05-30 ENCOUNTER — Emergency Department (HOSPITAL_COMMUNITY): Payer: 59

## 2013-05-30 ENCOUNTER — Encounter (HOSPITAL_COMMUNITY): Payer: Self-pay | Admitting: Emergency Medicine

## 2013-05-30 DIAGNOSIS — Z8639 Personal history of other endocrine, nutritional and metabolic disease: Secondary | ICD-10-CM | POA: Insufficient documentation

## 2013-05-30 DIAGNOSIS — M7989 Other specified soft tissue disorders: Secondary | ICD-10-CM

## 2013-05-30 DIAGNOSIS — M25461 Effusion, right knee: Secondary | ICD-10-CM

## 2013-05-30 DIAGNOSIS — M25469 Effusion, unspecified knee: Secondary | ICD-10-CM | POA: Insufficient documentation

## 2013-05-30 DIAGNOSIS — Z880 Allergy status to penicillin: Secondary | ICD-10-CM | POA: Insufficient documentation

## 2013-05-30 DIAGNOSIS — M79609 Pain in unspecified limb: Secondary | ICD-10-CM | POA: Insufficient documentation

## 2013-05-30 DIAGNOSIS — Z862 Personal history of diseases of the blood and blood-forming organs and certain disorders involving the immune mechanism: Secondary | ICD-10-CM | POA: Insufficient documentation

## 2013-05-30 MED ORDER — NAPROXEN 250 MG PO TABS
500.0000 mg | ORAL_TABLET | Freq: Once | ORAL | Status: AC
  Administered 2013-05-30: 500 mg via ORAL
  Filled 2013-05-30: qty 2

## 2013-05-30 MED ORDER — NAPROXEN 500 MG PO TABS: 500.0000 mg | ORAL_TABLET | Freq: Two times a day (BID) | ORAL | Status: DC

## 2013-05-30 NOTE — ED Provider Notes (Signed)
CSN: [id number]     Arrival date & time 05/30/13  1724 History   None    This chart was scribed for non-physician practitioner, [name] PA-C working with David Wall. [name], MD by [name], ED Scribe. This patient was seen in room TR09C/TR09C and the patient's care was started at 5:40 PM.   Chief Complaint  Patient presents with  . Joint Swelling    rt knee   HPI  HPI Comments: David Wall is a 58 y.o. male with a PMHx of Gout who presents to the Emergency Department complaining of mild joint swelling to the R knee x 2 days that is unchanged. He also reports radiating pain into the R calf described as "pulling". Pt states this pain is exacerbated with ambulation, weight bearing, and sitting. No alleviating factors at this time. He was seen at Urgent Care for same and was told it may be related to his past history of Gout. However, pt states this does not feel like his typical Gout symptoms. No recent new injury or trauma. At discharge he was prescribed Uloric once daily without any improvement. He has also tried ice to the area without any relief. No fever, chills, weakness, numbness, or tingling. He has no other pertinent past medical history. No other concerns this visit.  Past Medical History  Diagnosis Date  . Gout    History reviewed. No pertinent past surgical history. No family history on file. History  Substance Use Topics  . Smoking status: Never Smoker   . Smokeless tobacco: Not on file  . Alcohol Use: Yes    Review of Systems  Musculoskeletal: Positive for arthralgias (R knee) and joint swelling (R knee).  All other systems reviewed and are negative.     Allergies  Amoxicillin; Aspirin; and Penicillins  Home Medications   Prior to Admission medications   Medication Sig Start Date End Date Taking? Authorizing Provider  clindamycin (CLEOCIN) 300 MG capsule Take 1 capsule (300 mg total) by mouth 4 (four) times daily. X 7 days 01/31/13   April K  Palumbo-Rasch, MD  HYDROcodone-acetaminophen [facility]) 5-325 MG per tablet Take 1 tablet by mouth every 6 (six) hours as needed. 01/31/13   [name], MD  HYDROcodone-acetaminophen (NORCO/VICODIN) 5-325 MG per tablet Take 2 tablets by mouth every 6 (six) hours as needed. 01/26/13   [name], MD  ibuprofen (ADVIL,MOTRIN) 200 MG tablet Take 800 mg by mouth every 6 (six) hours as needed for pain.    Historical Provider, MD   Triage Vitals: BP 118/65  Pulse 85  Temp(Src) 98.1 F (36.7 C)  Resp 18  Ht 5\' 8"  (1.727 m)  Wt 260 lb (117.935 kg)  BMI 39.54 kg/m2  SpO2 99%   Physical Exam  Nursing note and vitals reviewed. Constitutional: He is oriented to person, place, and time. He appears well-developed and well-nourished. No distress.  HENT:  Head: Normocephalic and atraumatic.  Eyes: Conjunctivae and EOM are normal.  Neck: Normal range of motion. Neck supple.  Cardiovascular: Normal rate, regular rhythm, normal heart sounds and intact distal pulses.   Pulmonary/Chest: Effort normal and breath sounds normal.  Musculoskeletal: Normal range of motion. He exhibits edema and tenderness.  Swelling over later and medial joint line of R knee without erythema or warmth; Non tender Tenderness to palpation over propliteal space and proximal calf No calf swelling or color change Pain to posterior calf with knee extension  Neurological: He is alert and oriented to person,  place, and time.  Skin: Skin is warm and dry.  Psychiatric: He has a normal mood and affect. His behavior is normal.    ED Course  Procedures (including critical care time)  DIAGNOSTIC STUDIES: Oxygen Saturation is 99% on RA, Normal by my interpretation.    COORDINATION OF CARE: 5:57 PM- Will perform knee arthrocentesis procedure. Discussed treatment plan with pt at bedside and pt agreed to plan.    Knee Arthrocentesis Procedure Note Date:11/06/2010  At 5:58 PM Indication: Knee Effusion Procedure  performed by: [name] PA-C Site: R knee Technique: needle 18g Sterile procedures observed Time-out performed prior to start of procedure and correct patient, site and procedure verified.  Anesthetic used (type and amount): xylocaine 2%  Fluid aspirated (amount): 0 ml Appearance of Fluid: none  Patient's knee was placed into an appropriate position prior to introduction of needle. Patient tolerated procedure well with no complications. Blood loss was minimal.     Labs Review Labs Reviewed - No data to display  Imaging Review Dg Knee Complete 4 Views Right  05/30/2013   CLINICAL DATA:  Right knee pain/swelling  EXAM: RIGHT KNEE - COMPLETE 4+ VIEW  COMPARISON:  01/30/2013  FINDINGS: No fracture or dislocation is seen.  Mild degenerative changes with sharpening the tibial spines and patellofemoral osteophytosis.  Suspected moderate suprapatellar knee joint effusion.  IMPRESSION: Mild degenerative changes.  Moderate suprapatellar knee joint effusion.   Electronically Signed   By: [name] M.D.   On: 05/30/2013 19:18   *PRELIMINARY RESULTS*  Vascular Ultrasound  Right lower extremity venous duplex has been completed. Preliminary findings: no evidence of DVT. Right Baker's cyst noted. Enlarged right inguinal lymph node noted.    EKG Interpretation None      MDM   Final diagnoses:  Knee effusion, right    Patient presented with right knee pain and swelling. No overlying erythema or warmth concerning for infection. Does not appear to be gout. Patient reports this does not feel like his prior gout flares. Joint arthrocentesis attempted, no fluid obtained. He also has tenderness to posterior aspect of knee and calf. Plan to obtain lower extremity duplex along with x-ray.  7:24 PM Lower extremity duplex negative for DVT. X-ray showing mild degenerative changes and moderate suprapatellar knee joint effusion. Patient has seen Dr. Marcelino Scot, orthopedics in the past, I advised patient  to followup with him. Ace bandage applied, naproxen for pain and inflammation. Stable for discharge. Return precautions given. Patient states understanding of treatment care plan and is agreeable.  I personally performed the services described in this documentation, which was scribed in my presence. The recorded information has been reviewed and is accurate.    Illene Labrador, PA-C 05/30/13 1926

## 2013-05-30 NOTE — ED Notes (Signed)
Pt stated a pain level of 2. Pt ambulatory at d/c, stable.

## 2013-05-30 NOTE — Discharge Instructions (Signed)
Take naproxen as directed. Rest, ice and elevate your knee.  Knee Effusion The medical term for having fluid in your knee is effusion. This is often due to an internal derangement of the knee. This means something is wrong inside the knee. Some of the causes of fluid in the knee may be torn cartilage, a torn ligament, or bleeding into the joint from an injury. Your knee is likely more difficult to bend and move. This is often because there is increased pain and pressure in the joint. The time it takes for recovery from a knee effusion depends on different factors, including:   Type of injury.  Your age.  Physical and medical conditions.  Rehabilitation Strategies. How long you will be away from your normal activities will depend on what kind of knee problem you have and how much damage is present. Your knee has two types of cartilage. Articular cartilage covers the bone ends and lets your knee bend and move smoothly. Two menisci, thick pads of cartilage that form a rim inside the joint, help absorb shock and stabilize your knee. Ligaments bind the bones together and support your knee joint. Muscles move the joint, help support your knee, and take stress off the joint itself. CAUSES  Often an effusion in the knee is caused by an injury to one of the menisci. This is often a tear in the cartilage. Recovery after a meniscus injury depends on how much meniscus is damaged and whether you have damaged other knee tissue. Small tears may heal on their own with conservative treatment. Conservative means rest, limited weight bearing activity and muscle strengthening exercises. Your recovery may take up to 6 weeks.  TREATMENT  Larger tears may require surgery. Meniscus injuries may be treated during arthroscopy. Arthroscopy is a procedure in which your surgeon uses a small telescope like instrument to look in your knee. Your caregiver can make a more accurate diagnosis (learning what is wrong) by performing an  arthroscopic procedure. If your injury is on the inner margin of the meniscus, your surgeon may trim the meniscus back to a smooth rim. In other cases your surgeon will try to repair a damaged meniscus with stitches (sutures). This may make rehabilitation take longer, but may provide better long term result by helping your knee keep its shock absorption capabilities. Ligaments which are completely torn usually require surgery for repair. HOME CARE INSTRUCTIONS  Use crutches as instructed.  If a brace is applied, use as directed.  Once you are home, an ice pack applied to your swollen knee may help with discomfort and help decrease swelling.  Keep your knee raised (elevated) when you are not up and around or on crutches.  Only take over-the-counter or prescription medicines for pain, discomfort, or fever as directed by your caregiver.  Your caregivers will help with instructions for rehabilitation of your knee. This often includes strengthening exercises.  You may resume a normal diet and activities as directed. SEEK MEDICAL CARE IF:   There is increased swelling in your knee.  You notice redness, swelling, or increasing pain in your knee.  An unexplained oral temperature above 102 F (38.9 C) develops. SEEK IMMEDIATE MEDICAL CARE IF:   You develop a rash.  You have difficulty breathing.  You have any allergic reactions from medications you may have been given.  There is severe pain with any motion of the knee. MAKE SURE YOU:   Understand these instructions.  Will watch your condition.  Will get help  right away if you are not doing well or get worse. Document Released: 03/28/2003 Document Revised: 03/30/2011 Document Reviewed: 06/01/2007 [facility] Patient Information 2014 [place].

## 2013-05-30 NOTE — ED Provider Notes (Signed)
Medical screening examination/treatment/procedure(s) were performed by non-physician practitioner and as supervising physician I was immediately available for consultation/collaboration.   EKG Interpretation None       [name]. [name], MD 05/30/13 [phone]

## 2013-05-30 NOTE — ED Notes (Signed)
Pt states that he has had swelling to his rt knee since Sunday. Pt was seen at urgent care and told that it was possibly gout. Pt has hx of same. Pt states that the pain extends into his calf and does not feel like gout he has had in the past. Pt denies any known injury

## 2013-05-30 NOTE — Progress Notes (Signed)
*  PRELIMINARY RESULTS* Vascular Ultrasound Right lower extremity venous duplex has been completed.  Preliminary findings: no evidence of DVT. Right Baker's cyst noted. Enlarged right inguinal lymph node noted.    Landry Mellow, RDMS, RVT  05/30/2013, 6:29 PM

## 2013-09-26 ENCOUNTER — Telehealth: Payer: Self-pay | Admitting: Endocrinology

## 2013-09-26 NOTE — Telephone Encounter (Signed)
Patient was calling to schedule an office visit with Dr. Loanne Drilling  From what I can see this patient has not been seen since 2010 He wants to be seen for a CPE    Please advise   Thank You

## 2013-09-26 NOTE — Telephone Encounter (Signed)
ok 

## 2013-09-26 NOTE — Telephone Encounter (Signed)
See below ok to schedule? Thanks!

## 2013-09-26 NOTE — Telephone Encounter (Signed)
Contacted pt. Pt coming for CPE on 9/22

## 2013-10-10 ENCOUNTER — Ambulatory Visit (INDEPENDENT_AMBULATORY_CARE_PROVIDER_SITE_OTHER): Payer: 59 | Admitting: Endocrinology

## 2013-10-10 ENCOUNTER — Encounter: Payer: Self-pay | Admitting: Endocrinology

## 2013-10-10 ENCOUNTER — Telehealth: Payer: Self-pay | Admitting: Endocrinology

## 2013-10-10 VITALS — BP 118/64 | HR 64 | Temp 98.1°F | Ht 68.0 in | Wt 268.0 lb

## 2013-10-10 DIAGNOSIS — E119 Type 2 diabetes mellitus without complications: Secondary | ICD-10-CM

## 2013-10-10 DIAGNOSIS — Z0000 Encounter for general adult medical examination without abnormal findings: Secondary | ICD-10-CM | POA: Insufficient documentation

## 2013-10-10 DIAGNOSIS — R7309 Other abnormal glucose: Secondary | ICD-10-CM

## 2013-10-10 DIAGNOSIS — M109 Gout, unspecified: Secondary | ICD-10-CM

## 2013-10-10 DIAGNOSIS — Z23 Encounter for immunization: Secondary | ICD-10-CM

## 2013-10-10 DIAGNOSIS — Z125 Encounter for screening for malignant neoplasm of prostate: Secondary | ICD-10-CM | POA: Insufficient documentation

## 2013-10-10 DIAGNOSIS — E7800 Pure hypercholesterolemia, unspecified: Secondary | ICD-10-CM

## 2013-10-10 LAB — HEPATIC FUNCTION PANEL
ALT: 25 U/L (ref 0–53)
AST: 21 U/L (ref 0–37)
Albumin: 4 g/dL (ref 3.5–5.2)
Alkaline Phosphatase: 63 U/L (ref 39–117)
Bilirubin, Direct: 0.1 mg/dL (ref 0.0–0.3)
Total Bilirubin: 0.3 mg/dL (ref 0.2–1.2)
Total Protein: 7.3 g/dL (ref 6.0–8.3)

## 2013-10-10 LAB — CBC WITH DIFFERENTIAL/PLATELET
Basophils Absolute: 0 10*3/uL (ref 0.0–0.1)
Basophils Relative: 0.5 % (ref 0.0–3.0)
Eosinophils Absolute: 0.2 10*3/uL (ref 0.0–0.7)
Eosinophils Relative: 2.8 % (ref 0.0–5.0)
HCT: 41.6 % (ref 39.0–52.0)
Hemoglobin: 14 g/dL (ref 13.0–17.0)
Lymphocytes Relative: 27.7 % (ref 12.0–46.0)
Lymphs Abs: 1.8 10*3/uL (ref 0.7–4.0)
MCHC: 33.6 g/dL (ref 30.0–36.0)
MCV: 88.1 fl (ref 78.0–100.0)
Monocytes Absolute: 0.4 10*3/uL (ref 0.1–1.0)
Monocytes Relative: 5.7 % (ref 3.0–12.0)
Neutro Abs: 4 10*3/uL (ref 1.4–7.7)
Neutrophils Relative %: 63.3 % (ref 43.0–77.0)
Platelets: 307 10*3/uL (ref 150.0–400.0)
RBC: 4.72 Mil/uL (ref 4.22–5.81)
RDW: 13 % (ref 11.5–15.5)
WBC: 6.3 10*3/uL (ref 4.0–10.5)

## 2013-10-10 LAB — BASIC METABOLIC PANEL
BUN: 16 mg/dL (ref 6–23)
CHLORIDE: 107 meq/L (ref 96–112)
CO2: 20 mEq/L (ref 19–32)
Calcium: 9.3 mg/dL (ref 8.4–10.5)
Creatinine, Ser: 0.9 mg/dL (ref 0.4–1.5)
GFR: 106.63 mL/min (ref >60.00)
GLUCOSE: 124 mg/dL — AB (ref 70–99)
POTASSIUM: 4.1 meq/L (ref 3.5–5.1)
SODIUM: 135 meq/L (ref 135–145)

## 2013-10-10 LAB — LDL CHOLESTEROL, DIRECT: Direct LDL: 105.1 mg/dL

## 2013-10-10 LAB — URINALYSIS, ROUTINE W REFLEX MICROSCOPIC
Bilirubin Urine: NEGATIVE
Hgb urine dipstick: NEGATIVE
Ketones, ur: NEGATIVE
Leukocytes, UA: NEGATIVE
NITRITE: NEGATIVE
PH: 5.5 (ref 5.0–8.0)
RBC / HPF: NONE SEEN (ref 0–?)
SPECIFIC GRAVITY, URINE: 1.025 (ref 1.000–1.030)
Total Protein, Urine: NEGATIVE
Urine Glucose: NEGATIVE
Urobilinogen, UA: 0.2 (ref 0.0–1.0)
WBC UA: NONE SEEN (ref 0–?)

## 2013-10-10 LAB — LIPID PANEL
Cholesterol: 176 mg/dL (ref 0–200)
HDL: 21.3 mg/dL — ABNORMAL LOW (ref >39.00)
NonHDL: 154.7
Total CHOL/HDL Ratio: 8
Triglycerides: 349 mg/dL — ABNORMAL HIGH (ref 0.0–149.0)
VLDL: 69.8 mg/dL — ABNORMAL HIGH (ref 0.0–40.0)

## 2013-10-10 LAB — PSA: PSA: 0.84 ng/mL (ref 0.10–4.00)

## 2013-10-10 LAB — URIC ACID: Uric Acid, Serum: 10.5 mg/dL — ABNORMAL HIGH (ref 4.0–7.8)

## 2013-10-10 LAB — HEMOGLOBIN A1C: Hgb A1c MFr Bld: 6.9 % — ABNORMAL HIGH (ref 4.6–6.5)

## 2013-10-10 LAB — TSH: TSH: 2.35 u[IU]/mL (ref 0.35–4.50)

## 2013-10-10 MED ORDER — ALLOPURINOL 100 MG PO TABS: 100.0000 mg | ORAL_TABLET | Freq: Every day | ORAL | Status: DC

## 2013-10-10 MED ORDER — PIOGLITAZONE HCL 15 MG PO TABS: 15.0000 mg | ORAL_TABLET | Freq: Every day | ORAL | Status: DC

## 2013-10-10 NOTE — Patient Instructions (Addendum)
please consider these measures for your health:  minimize alcohol.  do not use tobacco products.  have a colonoscopy at least every 10 years from age 58.  keep firearms safely stored.  always use seat belts.  have working smoke alarms in your home.  see an eye doctor and dentist regularly.  never drive under the influence of alcohol or drugs (including prescription drugs).   blood tests are being requested for you today.  We'll contact you with results.  i have sent a prescription to your pharmacy, for the gout.  If you have no gout for the next few weeks, call so we can increase the gout pill.  Please return in 1 year.      Gout Gout is when your joints become red, sore, and swell (inflamed). This is caused by the buildup of uric acid crystals in the joints. Uric acid is a chemical that is normally in the blood. If the level of uric acid gets too high in the blood, these crystals form in your joints and tissues. Over time, these crystals can form into masses near the joints and tissues. These masses can destroy bone and cause the bone to look misshapen (deformed). HOME CARE   Do not take aspirin for pain.  Only take medicine as told by your doctor.  Rest the joint as much as you can. When in bed, keep sheets and blankets off painful areas.  Keep the sore joints raised (elevated).  Put warm or cold packs on painful joints. Use of warm or cold packs depends on which works best for you.  Use crutches if the painful joint is in your leg.  Drink enough fluids to keep your pee (urine) clear or pale yellow. Limit alcohol, sugary drinks, and drinks with fructose in them.  Follow your diet instructions. Pay careful attention to how much protein you eat. Include fruits, vegetables, whole grains, and fat-free or low-fat milk products in your daily diet. Talk to your doctor or dietitian about the use of coffee, vitamin C, and cherries. These may help lower uric acid levels.  Keep a healthy body  weight. GET HELP RIGHT AWAY IF:   You have watery poop (diarrhea), throw up (vomit), or have any side effects from medicines.  You do not feel better in 24 hours, or you are getting worse.  Your joint becomes suddenly more tender, and you have chills or a fever. MAKE SURE YOU:   Understand these instructions.  Will watch your condition.  Will get help right away if you are not doing well or get worse. Document Released: 10/15/2007 Document Revised: 05/22/2013 Document Reviewed: 08/19/2011 [facility] Patient Information 2015 [place], [place]. This information is not intended to replace advice given to you by your health care provider. Make sure you discuss any questions you have with your health care provider.

## 2013-10-10 NOTE — Telephone Encounter (Signed)
Pt needs the med we called in sent to [facility] not the [facility] please call the spouse # [phone] when done

## 2013-10-10 NOTE — Telephone Encounter (Signed)
Pt advised that medication has been sent to [facility] outpatient pharmacy.

## 2013-10-10 NOTE — Progress Notes (Signed)
Subjective:    Patient ID: David Wall, male    DOB: 19-Aug-1955, 58 y.o.   MRN: [id number]  HPI Pt is here for regular wellness examination, and is feeling pretty well in general, and says chronic med probs are stable, except as noted below Past Medical History  Diagnosis Date  . Gout     No past surgical history on file.  History   Social History  . Marital Status: Married    Spouse Name: N/A    Number of Children: N/A  . Years of Education: N/A   Occupational History  . Not on file.   Social History Main Topics  . Smoking status: Never Smoker   . Smokeless tobacco: Not on file  . Alcohol Use: Yes  . Drug Use: No  . Sexual Activity: Not on file   Other Topics Concern  . Not on file   Social History Narrative  . No narrative on file    No current outpatient prescriptions on file prior to visit.   No current facility-administered medications on file prior to visit.    Allergies  Allergen Reactions  . Amoxicillin Shortness Of Breath  . Aspirin Shortness Of Breath  . Penicillins Shortness Of Breath    No family history on file.  BP 118/64  Pulse 64  Temp(Src) 98.1 F (36.7 C) (Oral)  Ht 5\' 8"  (1.727 m)  Wt 268 lb (121.564 kg)  BMI 40.76 kg/m2  SpO2 96%  Review of Systems  Constitutional: Negative for unexpected weight change.  HENT: Negative for hearing loss.   Eyes: Negative for visual disturbance.  Respiratory: Negative for shortness of breath.   Cardiovascular: Negative for chest pain.  Gastrointestinal: Negative for anal bleeding.  Endocrine: Negative for cold intolerance.  Genitourinary: Negative for hematuria and difficulty urinating.  Musculoskeletal: Negative for back pain.  Skin: Negative for rash.  Allergic/Immunologic: Negative for environmental allergies.  Neurological: Negative for numbness.  Hematological: Does not bruise/bleed easily.  Psychiatric/Behavioral: Negative for dysphoric mood.       Objective:   Physical  Exam VS: see vs page GEN: no distress HEAD: head: no deformity eyes: no periorbital swelling, no proptosis external nose and ears are normal mouth: no lesion seen NECK: supple, thyroid is not enlarged CHEST WALL: no deformity LUNGS: clear to auscultation BREASTS:  No gynecomastia CV: reg rate and rhythm, no murmur ABD: abdomen is soft, nontender.  no hepatosplenomegaly.  not distended.  no hernia RECTAL: normal external and internal exam.  heme neg. PROSTATE:  Normal size.  No nodule MUSCULOSKELETAL: muscle bulk and strength are grossly normal.  no obvious joint swelling.  gait is normal and steady EXTEMITIES: no deformity.  no ulcer on the feet.  feet are of normal color and temp.  no edema PULSES: dorsalis pedis intact bilat.  no carotid bruit NEURO:  cn 2-12 grossly intact.   readily moves all 4's.  sensation is intact to touch on the feet SKIN:  Normal texture and temperature.  No rash or suspicious lesion is visible.   NODES:  None palpable at the neck PSYCH: alert, well-oriented.  Does not appear anxious nor depressed.        Assessment & Plan:  Wellness visit today, with problems stable, except as noted. we discussed code status.  pt requests full code, but would not want to be started or maintained on artificial life-support measures if there was not a reasonable chance of recovery    SEPARATE EVALUATION FOLLOWS--EACH PROBLEM HERE  IS NEW, NOT RESPONDING TO TREATMENT, OR POSES SIGNIFICANT RISK TO THE PATIENT'S HEALTH: HISTORY OF THE PRESENT ILLNESS: Pt states few months of intermittent moderate pain at both knees and assoc pain at both feet PAST MEDICAL HISTORY reviewed and up to date today REVIEW OF SYSTEMS: Denies fever PHYSICAL EXAMINATION: VITAL SIGNS:  See vs page GENERAL: no distress. Gait: normal and steady. Knees: no swelling/tenderness.   LAB/XRAY RESULTS: i reviewed electrocardiogram Uric acid=10 Lab Results  Component Value Date   HGBA1C 6.9*  10/10/2013   Lab Results  Component Value Date   CHOL 176 10/10/2013   HDL 21.30* 10/10/2013   LDLCALC  Value: 106        Total Cholesterol/HDL:CHD Risk Coronary Heart Disease Risk Table                     Men   Women  1/2 Average Risk   3.4   3.3* 03/13/2007   LDLDIRECT 105.1 10/10/2013   TRIG 349.0* 10/10/2013   CHOLHDL 8 10/10/2013  IMPRESSION: Hyperuricemia, new to me Arthralgias, possibly due to gout Hypertriglyceridemia, persistent DM: new PLAN:  i have sent prescriptions to your pharmacy, for actos and allopurinol

## 2013-10-11 DIAGNOSIS — E119 Type 2 diabetes mellitus without complications: Secondary | ICD-10-CM | POA: Insufficient documentation

## 2013-10-13 ENCOUNTER — Telehealth: Payer: Self-pay | Admitting: Endocrinology

## 2013-10-13 NOTE — Telephone Encounter (Signed)
Pt advised.

## 2013-10-13 NOTE — Telephone Encounter (Signed)
Patient is returning your call.  

## 2013-10-17 ENCOUNTER — Telehealth: Payer: Self-pay | Admitting: Endocrinology

## 2013-10-17 NOTE — Telephone Encounter (Signed)
Please d/c allopurinol. Please offer ov tomorrow, if pt wants to be seen for the rash.

## 2013-10-17 NOTE — Telephone Encounter (Signed)
Patient stated that medication Allopurinol 100 mg is giving him a rash, please advise

## 2013-10-17 NOTE — Telephone Encounter (Signed)
See below, contacted pt. He states the rash is currently on his back and devolved about two days ago. Pt states the rash is not getting any better.  Please advise, Thanks!

## 2013-10-18 NOTE — Telephone Encounter (Signed)
Requested call back tto discuss.

## 2013-10-19 NOTE — Telephone Encounter (Signed)
Patient is returing your call °

## 2013-10-20 NOTE — Telephone Encounter (Signed)
Called pt. He states that since coming off the allopurinol his rash has gotten better. Pt declined appointment at this time. Pt advised to call if rash gets worse.

## 2013-10-24 ENCOUNTER — Encounter: Payer: Self-pay | Admitting: Endocrinology

## 2013-10-24 ENCOUNTER — Ambulatory Visit (INDEPENDENT_AMBULATORY_CARE_PROVIDER_SITE_OTHER): Payer: 59 | Admitting: Endocrinology

## 2013-10-24 VITALS — BP 118/68 | HR 62 | Temp 97.8°F | Ht 68.0 in | Wt 272.0 lb

## 2013-10-24 DIAGNOSIS — Z0000 Encounter for general adult medical examination without abnormal findings: Secondary | ICD-10-CM

## 2013-10-24 MED ORDER — FEBUXOSTAT 40 MG PO TABS: 40.0000 mg | ORAL_TABLET | Freq: Every day | ORAL | Status: DC

## 2013-10-24 MED ORDER — CELECOXIB 200 MG PO CAPS: 200.0000 mg | ORAL_CAPSULE | Freq: Two times a day (BID) | ORAL | Status: DC

## 2013-10-24 NOTE — Patient Instructions (Addendum)
i have sent a prescription to your pharmacy, for an alternative to the allopurinol.  i have also sent a prescription to your pharmacy, for the pain and inflammation. I hope you feel better soon.  If you don't feel better within the next few days, please call back.

## 2013-10-24 NOTE — Progress Notes (Signed)
   Subjective:    Patient ID: David Wall, male    DOB: Dec 17, 1955, 58 y.o.   MRN: [id number]  HPI Pt states 2 weeks of moderate pain at the left medial thigh, worse in the context of ambulation.  No assoc numbness.  He has never had this before.  No local injury.  He stopped allopurinol, due to rash, then the rash stopped.   Past Medical History  Diagnosis Date  . Gout     No past surgical history on file.  History   Social History  . Marital Status: Married    Spouse Name: N/A    Number of Children: N/A  . Years of Education: N/A   Occupational History  . Not on file.   Social History Main Topics  . Smoking status: Never Smoker   . Smokeless tobacco: Not on file  . Alcohol Use: Yes  . Drug Use: No  . Sexual Activity: Not on file   Other Topics Concern  . Not on file   Social History Narrative  . No narrative on file    Current Outpatient Prescriptions on File Prior to Visit  Medication Sig Dispense Refill  . pioglitazone (ACTOS) 15 MG tablet Take 1 tablet (15 mg total) by mouth daily.  30 tablet  11   No current facility-administered medications on file prior to visit.    Allergies  Allergen Reactions  . Amoxicillin Shortness Of Breath  . Aspirin Shortness Of Breath  . Penicillins Shortness Of Breath    No family history on file.  BP 118/68  Pulse 62  Temp(Src) 97.8 F (36.6 C) (Oral)  Ht 5\' 8"  (1.727 m)  Wt 272 lb (123.378 kg)  BMI 41.37 kg/m2  SpO2 97%  Review of Systems Denies rash and fever.     Objective:   Physical Exam VITAL SIGNS:  See vs page.  GENERAL: no distress. Left thigh: nontender. Gait: favors LLE. Left leg: no edema.       Assessment & Plan:  Thigh pain, new: uncertain etiology Rash, due to allopurinol, better off it.  Patient is advised the following: Patient Instructions  i have sent a prescription to your pharmacy, for an alternative to the allopurinol.  i have also sent a prescription to your pharmacy, for  the pain and inflammation. I hope you feel better soon.  If you don't feel better within the next few days, please call back.

## 2013-11-13 ENCOUNTER — Encounter: Payer: Self-pay | Admitting: Endocrinology

## 2013-11-16 ENCOUNTER — Emergency Department (INDEPENDENT_AMBULATORY_CARE_PROVIDER_SITE_OTHER)
Admission: EM | Admit: 2013-11-16 | Discharge: 2013-11-16 | Disposition: A | Discharge status: Home / Self Care | Payer: 59 | Source: Home / Self Care | Attending: Family Medicine | Admitting: Family Medicine

## 2013-11-16 ENCOUNTER — Encounter (HOSPITAL_COMMUNITY): Payer: Self-pay | Admitting: Emergency Medicine

## 2013-11-16 DIAGNOSIS — M25512 Pain in left shoulder: Secondary | ICD-10-CM

## 2013-11-16 MED ORDER — TRAMADOL HCL 50 MG PO TABS: 50.0000 mg | ORAL_TABLET | Freq: Four times a day (QID) | ORAL | Status: DC | PRN

## 2013-11-16 MED ORDER — METHYLPREDNISOLONE ACETATE 40 MG/ML IJ SUSP
40.0000 mg | Freq: Once | INTRAMUSCULAR | Status: AC
  Administered 2013-11-16: 40 mg via INTRA_ARTICULAR

## 2013-11-16 MED ORDER — METHYLPREDNISOLONE ACETATE 40 MG/ML IJ SUSP
INTRAMUSCULAR | Status: AC
  Filled 2013-11-16: qty 5

## 2013-11-16 MED ORDER — BUPIVACAINE HCL (PF) 0.5 % IJ SOLN
INTRAMUSCULAR | Status: AC
  Filled 2013-11-16: qty 10

## 2013-11-16 MED ORDER — BUPIVACAINE HCL 0.5 % IJ SOLN
5.0000 mL | Freq: Once | INTRAMUSCULAR | Status: AC
  Administered 2013-11-16: 5 mL via INTRA_ARTICULAR

## 2013-11-16 NOTE — Discharge Instructions (Signed)
Thank you for coming in today. Call or go to the ER if you develop a large red swollen joint with extreme pain or oozing puss.  Follow-up with [facility] orthopedics as needed.  Use tramadol for severe pain. Do not take this medication and drive.   Impingement Syndrome, Rotator Cuff, Bursitis with Rehab Impingement syndrome is a condition that involves inflammation of the tendons of the rotator cuff and the subacromial bursa, that causes pain in the shoulder. The rotator cuff consists of four tendons and muscles that control much of the shoulder and upper arm function. The subacromial bursa is a fluid filled sac that helps reduce friction between the rotator cuff and one of the bones of the shoulder (acromion). Impingement syndrome is usually an overuse injury that causes swelling of the bursa (bursitis), swelling of the tendon (tendonitis), and/or a tear of the tendon (strain). Strains are classified into three categories. Grade 1 strains cause pain, but the tendon is not lengthened. Grade 2 strains include a lengthened ligament, due to the ligament being stretched or partially ruptured. With grade 2 strains there is still function, although the function may be decreased. Grade 3 strains include a complete tear of the tendon or muscle, and function is usually impaired. SYMPTOMS   Pain around the shoulder, often at the outer portion of the upper arm.  Pain that gets worse with shoulder function, especially when reaching overhead or lifting.  Sometimes, aching when not using the arm.  Pain that wakes you up at night.  Sometimes, tenderness, swelling, warmth, or redness over the affected area.  Loss of strength.  Limited motion of the shoulder, especially reaching behind the back (to the back pocket or to unhook bra) or across your body.  Crackling sound (crepitation) when moving the arm.  Biceps tendon pain and inflammation (in the front of the shoulder). Worse when bending the elbow or  lifting. CAUSES  Impingement syndrome is often an overuse injury, in which chronic (repetitive) motions cause the tendons or bursa to become inflamed. A strain occurs when a force is paced on the tendon or muscle that is greater than it can withstand. Common mechanisms of injury include: Stress from sudden increase in duration, frequency, or intensity of training.  Direct hit (trauma) to the shoulder.  Aging, erosion of the tendon with normal use.  Bony bump on shoulder (acromial spur). RISK INCREASES WITH:  Contact sports (football, wrestling, boxing).  Throwing sports (baseball, tennis, volleyball).  Weightlifting and bodybuilding.  Heavy labor.  Previous injury to the rotator cuff, including impingement.  Poor shoulder strength and flexibility.  Failure to warm up properly before activity.  Inadequate protective equipment.  Old age.  Bony bump on shoulder (acromial spur). PREVENTION   Warm up and stretch properly before activity.  Allow for adequate recovery between workouts.  Maintain physical fitness:  Strength, flexibility, and endurance.  Cardiovascular fitness.  Learn and use proper exercise technique. PROGNOSIS  If treated properly, impingement syndrome usually goes away within 6 weeks. Sometimes surgery is required.  RELATED COMPLICATIONS   Longer healing time if not properly treated, or if not given enough time to heal.  Recurring symptoms, that result in a chronic condition.  Shoulder stiffness, frozen shoulder, or loss of motion.  Rotator cuff tendon tear.  Recurring symptoms, especially if activity is resumed too soon, with overuse, with a direct blow, or when using poor technique. TREATMENT  Treatment first involves the use of ice and medicine, to reduce pain and inflammation. The  use of strengthening and stretching exercises may help reduce pain with activity. These exercises may be performed at home or with a therapist. If non-surgical  treatment is unsuccessful after more than 6 months, surgery may be advised. After surgery and rehabilitation, activity is usually possible in 3 months.  MEDICATION  If pain medicine is needed, nonsteroidal anti-inflammatory medicines (aspirin and ibuprofen), or other minor pain relievers (acetaminophen), are often advised.  Do not take pain medicine for 7 days before surgery.  Prescription pain relievers may be given, if your caregiver thinks they are needed. Use only as directed and only as much as you need.  Corticosteroid injections may be given by your caregiver. These injections should be reserved for the most serious cases, because they may only be given a certain number of times. HEAT AND COLD  Cold treatment (icing) should be applied for 10 to 15 minutes every 2 to 3 hours for inflammation and pain, and immediately after activity that aggravates your symptoms. Use ice packs or an ice massage.  Heat treatment may be used before performing stretching and strengthening activities prescribed by your caregiver, physical therapist, or athletic trainer. Use a heat pack or a warm water soak. SEEK MEDICAL CARE IF:   Symptoms get worse or do not improve in 4 to 6 weeks, despite treatment.  New, unexplained symptoms develop. (Drugs used in treatment may produce side effects.) EXERCISES  RANGE OF MOTION (ROM) AND STRETCHING EXERCISES - Impingement Syndrome (Rotator Cuff  Tendinitis, Bursitis) These exercises may help you when beginning to rehabilitate your injury. Your symptoms may go away with or without further involvement from your physician, physical therapist or athletic trainer. While completing these exercises, remember:   Restoring tissue flexibility helps normal motion to return to the joints. This allows healthier, less painful movement and activity.  An effective stretch should be held for at least 30 seconds.  A stretch should never be painful. You should only feel a gentle  lengthening or release in the stretched tissue. STRETCH - Flexion, Standing  Stand with good posture. With an underhand grip on your right / left hand, and an overhand grip on the opposite hand, grasp a broomstick or cane so that your hands are a little more than shoulder width apart.  Keeping your right / left elbow straight and shoulder muscles relaxed, push the stick with your opposite hand, to raise your right / left arm in front of your body and then overhead. Raise your arm until you feel a stretch in your right / left shoulder, but before you have increased shoulder pain.  Try to avoid shrugging your right / left shoulder as your arm rises, by keeping your shoulder blade tucked down and toward your mid-back spine. Hold for __________ seconds.  Slowly return to the starting position. Repeat __________ times. Complete this exercise __________ times per day. STRETCH - Abduction, Supine  Lie on your back. With an underhand grip on your right / left hand and an overhand grip on the opposite hand, grasp a broomstick or cane so that your hands are a little more than shoulder width apart.  Keeping your right / left elbow straight and your shoulder muscles relaxed, push the stick with your opposite hand, to raise your right / left arm out to the side of your body and then overhead. Raise your arm until you feel a stretch in your right / left shoulder, but before you have increased shoulder pain.  Try to avoid shrugging your right /  left shoulder as your arm rises, by keeping your shoulder blade tucked down and toward your mid-back spine. Hold for __________ seconds.  Slowly return to the starting position. Repeat __________ times. Complete this exercise __________ times per day. ROM - Flexion, Active-Assisted  Lie on your back. You may bend your knees for comfort.  Grasp a broomstick or cane so your hands are about shoulder width apart. Your right / left hand should grip the end of the stick,  so that your hand is positioned "thumbs-up," as if you were about to shake hands.  Using your healthy arm to lead, raise your right / left arm overhead, until you feel a gentle stretch in your shoulder. Hold for __________ seconds.  Use the stick to assist in returning your right / left arm to its starting position. Repeat __________ times. Complete this exercise __________ times per day.  ROM - Internal Rotation, Supine   Lie on your back on a firm surface. Place your right / left elbow about 60 degrees away from your side. Elevate your elbow with a folded towel, so that the elbow and shoulder are the same height.  Using a broomstick or cane and your strong arm, pull your right / left hand toward your body until you feel a gentle stretch, but no increase in your shoulder pain. Keep your shoulder and elbow in place throughout the exercise.  Hold for __________ seconds. Slowly return to the starting position. Repeat __________ times. Complete this exercise __________ times per day. STRETCH - Internal Rotation  Place your right / left hand behind your back, palm up.  Throw a towel or belt over your opposite shoulder. Grasp the towel with your right / left hand.  While keeping an upright posture, gently pull up on the towel, until you feel a stretch in the front of your right / left shoulder.  Avoid shrugging your right / left shoulder as your arm rises, by keeping your shoulder blade tucked down and toward your mid-back spine.  Hold for __________ seconds. Release the stretch, by lowering your healthy hand. Repeat __________ times. Complete this exercise __________ times per day. ROM - Internal Rotation   Using an underhand grip, grasp a stick behind your back with both hands.  While standing upright with good posture, slide the stick up your back until you feel a mild stretch in the front of your shoulder.  Hold for __________ seconds. Slowly return to your starting position. Repeat  __________ times. Complete this exercise __________ times per day.  STRETCH - Posterior Shoulder Capsule   Stand or sit with good posture. Grasp your right / left elbow and draw it across your chest, keeping it at the same height as your shoulder.  Pull your elbow, so your upper arm comes in closer to your chest. Pull until you feel a gentle stretch in the back of your shoulder.  Hold for __________ seconds. Repeat __________ times. Complete this exercise __________ times per day. STRENGTHENING EXERCISES - Impingement Syndrome (Rotator Cuff Tendinitis, Bursitis) These exercises may help you when beginning to rehabilitate your injury. They may resolve your symptoms with or without further involvement from your physician, physical therapist or athletic trainer. While completing these exercises, remember:  Muscles can gain both the endurance and the strength needed for everyday activities through controlled exercises.  Complete these exercises as instructed by your physician, physical therapist or athletic trainer. Increase the resistance and repetitions only as guided.  You may experience muscle soreness or  fatigue, but the pain or discomfort you are trying to eliminate should never worsen during these exercises. If this pain does get worse, stop and make sure you are following the directions exactly. If the pain is still present after adjustments, discontinue the exercise until you can discuss the trouble with your clinician.  During your recovery, avoid activity or exercises which involve actions that place your injured hand or elbow above your head or behind your back or head. These positions stress the tissues which you are trying to heal. STRENGTH - Scapular Depression and Adduction   With good posture, sit on a firm chair. Support your arms in front of you, with pillows, arm rests, or on a table top. Have your elbows in line with the sides of your body.  Gently draw your shoulder blades  down and toward your mid-back spine. Gradually increase the tension, without tensing the muscles along the top of your shoulders and the back of your neck.  Hold for __________ seconds. Slowly release the tension and relax your muscles completely before starting the next repetition.  After you have practiced this exercise, remove the arm support and complete the exercise in standing as well as sitting position. Repeat __________ times. Complete this exercise __________ times per day.  STRENGTH - Shoulder Abductors, Isometric  With good posture, stand or sit about 4-6 inches from a wall, with your right / left side facing the wall.  Bend your right / left elbow. Gently press your right / left elbow into the wall. Increase the pressure gradually, until you are pressing as hard as you can, without shrugging your shoulder or increasing any shoulder discomfort.  Hold for __________ seconds.  Release the tension slowly. Relax your shoulder muscles completely before you begin the next repetition. Repeat __________ times. Complete this exercise __________ times per day.  STRENGTH - External Rotators, Isometric  Keep your right / left elbow at your side and bend it 90 degrees.  Step into a door frame so that the outside of your right / left wrist can press against the door frame without your upper arm leaving your side.  Gently press your right / left wrist into the door frame, as if you were trying to swing the back of your hand away from your stomach. Gradually increase the tension, until you are pressing as hard as you can, without shrugging your shoulder or increasing any shoulder discomfort.  Hold for __________ seconds.  Release the tension slowly. Relax your shoulder muscles completely before you begin the next repetition. Repeat __________ times. Complete this exercise __________ times per day.  STRENGTH - Supraspinatus   Stand or sit with good posture. Grasp a __________ weight, or an  exercise band or tubing, so that your hand is "thumbs-up," like you are shaking hands.  Slowly lift your right / left arm in a "V" away from your thigh, diagonally into the space between your side and straight ahead. Lift your hand to shoulder height or as far as you can, without increasing any shoulder pain. At first, many people do not lift their hands above shoulder height.  Avoid shrugging your right / left shoulder as your arm rises, by keeping your shoulder blade tucked down and toward your mid-back spine.  Hold for __________ seconds. Control the descent of your hand, as you slowly return to your starting position. Repeat __________ times. Complete this exercise __________ times per day.  STRENGTH - External Rotators  Secure a rubber exercise band or  tubing to a fixed object (table, pole) so that it is at the same height as your right / left elbow when you are standing or sitting on a firm surface.  Stand or sit so that the secured exercise band is at your uninjured side.  Bend your right / left elbow 90 degrees. Place a folded towel or small pillow under your right / left arm, so that your elbow is a few inches away from your side.  Keeping the tension on the exercise band, pull it away from your body, as if pivoting on your elbow. Be sure to keep your body steady, so that the movement is coming only from your rotating shoulder.  Hold for __________ seconds. Release the tension in a controlled manner, as you return to the starting position. Repeat __________ times. Complete this exercise __________ times per day.  STRENGTH - Internal Rotators   Secure a rubber exercise band or tubing to a fixed object (table, pole) so that it is at the same height as your right / left elbow when you are standing or sitting on a firm surface.  Stand or sit so that the secured exercise band is at your right / left side.  Bend your elbow 90 degrees. Place a folded towel or small pillow under your right  / left arm so that your elbow is a few inches away from your side.  Keeping the tension on the exercise band, pull it across your body, toward your stomach. Be sure to keep your body steady, so that the movement is coming only from your rotating shoulder.  Hold for __________ seconds. Release the tension in a controlled manner, as you return to the starting position. Repeat __________ times. Complete this exercise __________ times per day.  STRENGTH - Scapular Protractors, Standing   Stand arms length away from a wall. Place your hands on the wall, keeping your elbows straight.  Begin by dropping your shoulder blades down and toward your mid-back spine.  To strengthen your protractors, keep your shoulder blades down, but slide them forward on your rib cage. It will feel as if you are lifting the back of your rib cage away from the wall. This is a subtle motion and can be challenging to complete. Ask your caregiver for further instruction, if you are not sure you are doing the exercise correctly.  Hold for __________ seconds. Slowly return to the starting position, resting the muscles completely before starting the next repetition. Repeat __________ times. Complete this exercise __________ times per day. STRENGTH - Scapular Protractors, Supine  Lie on your back on a firm surface. Extend your right / left arm straight into the air while holding a __________ weight in your hand.  Keeping your head and back in place, lift your shoulder off the floor.  Hold for __________ seconds. Slowly return to the starting position, and allow your muscles to relax completely before starting the next repetition. Repeat __________ times. Complete this exercise __________ times per day. STRENGTH - Scapular Protractors, Quadruped  Get onto your hands and knees, with your shoulders directly over your hands (or as close as you can be, comfortably).  Keeping your elbows locked, lift the back of your rib cage up  into your shoulder blades, so your mid-back rounds out. Keep your neck muscles relaxed.  Hold this position for __________ seconds. Slowly return to the starting position and allow your muscles to relax completely before starting the next repetition. Repeat __________ times. Complete this exercise  __________ times per day.  STRENGTH - Scapular Retractors  Secure a rubber exercise band or tubing to a fixed object (table, pole), so that it is at the height of your shoulders when you are either standing, or sitting on a firm armless chair.  With a palm down grip, grasp an end of the band in each hand. Straighten your elbows and lift your hands straight in front of you, at shoulder height. Step back, away from the secured end of the band, until it becomes tense.  Squeezing your shoulder blades together, draw your elbows back toward your sides, as you bend them. Keep your upper arms lifted away from your body throughout the exercise.  Hold for __________ seconds. Slowly ease the tension on the band, as you reverse the directions and return to the starting position. Repeat __________ times. Complete this exercise __________ times per day. STRENGTH - Shoulder Extensors   Secure a rubber exercise band or tubing to a fixed object (table, pole) so that it is at the height of your shoulders when you are either standing, or sitting on a firm armless chair.  With a thumbs-up grip, grasp an end of the band in each hand. Straighten your elbows and lift your hands straight in front of you, at shoulder height. Step back, away from the secured end of the band, until it becomes tense.  Squeezing your shoulder blades together, pull your hands down to the sides of your thighs. Do not allow your hands to go behind you.  Hold for __________ seconds. Slowly ease the tension on the band, as you reverse the directions and return to the starting position. Repeat __________ times. Complete this exercise __________ times  per day.  STRENGTH - Scapular Retractors and External Rotators   Secure a rubber exercise band or tubing to a fixed object (table, pole) so that it is at the height as your shoulders, when you are either standing, or sitting on a firm armless chair.  With a palm down grip, grasp an end of the band in each hand. Bend your elbows 90 degrees and lift your elbows to shoulder height, at your sides. Step back, away from the secured end of the band, until it becomes tense.  Squeezing your shoulder blades together, rotate your shoulders so that your upper arms and elbows remain stationary, but your fists travel upward to head height.  Hold for __________ seconds. Slowly ease the tension on the band, as you reverse the directions and return to the starting position. Repeat __________ times. Complete this exercise __________ times per day.  STRENGTH - Scapular Retractors and External Rotators, Rowing   Secure a rubber exercise band or tubing to a fixed object (table, pole) so that it is at the height of your shoulders, when you are either standing, or sitting on a firm armless chair.  With a palm down grip, grasp an end of the band in each hand. Straighten your elbows and lift your hands straight in front of you, at shoulder height. Step back, away from the secured end of the band, until it becomes tense.  Step 1: Squeeze your shoulder blades together. Bending your elbows, draw your hands to your chest, as if you are rowing a boat. At the end of this motion, your hands and elbow should be at shoulder height and your elbows should be out to your sides.  Step 2: Rotate your shoulders, to raise your hands above your head. Your forearms should be vertical and your  upper arms should be horizontal.  Hold for __________ seconds. Slowly ease the tension on the band, as you reverse the directions and return to the starting position. Repeat __________ times. Complete this exercise __________ times per day.    STRENGTH - Scapular Depressors  Find a sturdy chair without wheels, such as a dining room chair.  Keeping your feet on the floor, and your hands on the chair arms, lift your bottom up from the seat, and lock your elbows.  Keeping your elbows straight, allow gravity to pull your body weight down. Your shoulders will rise toward your ears.  Raise your body against gravity by drawing your shoulder blades down your back, shortening the distance between your shoulders and ears. Although your feet should always maintain contact with the floor, your feet should progressively support less body weight, as you get stronger.  Hold for __________ seconds. In a controlled and slow manner, lower your body weight to begin the next repetition. Repeat __________ times. Complete this exercise __________ times per day.  Document Released: 01/05/2005 Document Revised: 03/30/2011 Document Reviewed: 04/19/2008 [facility] Patient Information 2015 [place], [place]. This information is not intended to replace advice given to you by your health care provider. Make sure you discuss any questions you have with your health care provider.

## 2013-11-16 NOTE — ED Notes (Signed)
Pt states that he has had 2 weeks of left shoulder pain. Pt is in no acute distress at this time.

## 2013-11-16 NOTE — ED Provider Notes (Signed)
David Wall is a 58 y.o. male who presents to Urgent Care today for left shoulder pain. Patient has a two-week history of moderate left-sided shoulder pain. Pain has occurred without injury. The pain is worse with overhead motion reaching back and at night. Pain is present in the anterior lateral aspect of his left upper arm. The pain does not radiate beyond his elbow. No weakness or numbness. He has tried over-the-counter medications which have not helped much.   Past Medical History  Diagnosis Date  . Gout    History  Substance Use Topics  . Smoking status: Never Smoker   . Smokeless tobacco: Not on file  . Alcohol Use: Yes   ROS as above Medications: Current Facility-Administered Medications  Medication Dose Route Frequency Provider Last Rate Last Dose  . bupivacaine (MARCAINE) 0.5 % (with pres) injection 5 mL  5 mL Intra-articular Once Gregor Hams, MD      . methylPREDNISolone acetate (DEPO-MEDROL) injection 40 mg  40 mg Intra-articular Once Gregor Hams, MD       Current Outpatient Prescriptions  Medication Sig Dispense Refill  . traMADol (ULTRAM) 50 MG tablet Take 1 tablet (50 mg total) by mouth every 6 (six) hours as needed.  15 tablet  0  . [DISCONTINUED] febuxostat (ULORIC) 40 MG tablet Take 1 tablet (40 mg total) by mouth daily.  30 tablet  11  . [DISCONTINUED] pioglitazone (ACTOS) 15 MG tablet Take 1 tablet (15 mg total) by mouth daily.  30 tablet  11    Exam:  BP 107/71  Pulse 68  Temp(Src) 97.8 F (36.6 C) (Oral)  Resp 18  SpO2 98% Gen: Well NAD Neck: Nontender to spinal midline normal neck range of motion Shoulder: Nontender. Normal external and internal range of motion. Abduction is limited to about 120. Positive impingement testing Strength is intact throughout. Pulses Dorsal sensation are intact distally bilaterally  Subacromial Injection: Left  Consent obtained and time out performed.  Area cleaned with alcohol.  40Mg  of depomedrol and 55ml of 0.5%  marcaine was injected into the subacromial bursa without complication or bleeding. Patient tolerated the procedure well.     No results found for this or any previous visit (from the past 24 hour(s)). No results found.  Assessment and Plan: 58 y.o. male with left shoulder subacromial bursitis versus impingement. Status post corticosteroid injection. Tramadol prescribed. Range of motion instructions reviewed. Follow up with orthopedics if not better.  Discussed warning signs or symptoms. Please see discharge instructions. Patient expresses understanding.     Gregor Hams, MD 11/16/13 [phone]

## 2013-12-18 ENCOUNTER — Encounter (HOSPITAL_BASED_OUTPATIENT_CLINIC_OR_DEPARTMENT_OTHER): Payer: Self-pay | Admitting: *Deleted

## 2013-12-18 ENCOUNTER — Emergency Department (HOSPITAL_BASED_OUTPATIENT_CLINIC_OR_DEPARTMENT_OTHER)
Admission: EM | Admit: 2013-12-18 | Discharge: 2013-12-19 | Disposition: A | Discharge status: Home / Self Care | Payer: 59 | Attending: Emergency Medicine | Admitting: Emergency Medicine

## 2013-12-18 DIAGNOSIS — K4090 Unilateral inguinal hernia, without obstruction or gangrene, not specified as recurrent: Secondary | ICD-10-CM | POA: Insufficient documentation

## 2013-12-18 DIAGNOSIS — N50819 Testicular pain, unspecified: Secondary | ICD-10-CM

## 2013-12-18 DIAGNOSIS — Z880 Allergy status to penicillin: Secondary | ICD-10-CM | POA: Insufficient documentation

## 2013-12-18 DIAGNOSIS — Z8739 Personal history of other diseases of the musculoskeletal system and connective tissue: Secondary | ICD-10-CM | POA: Diagnosis not present

## 2013-12-18 DIAGNOSIS — R52 Pain, unspecified: Secondary | ICD-10-CM

## 2013-12-18 DIAGNOSIS — R1030 Lower abdominal pain, unspecified: Secondary | ICD-10-CM

## 2013-12-18 DIAGNOSIS — N508 Other specified disorders of male genital organs: Secondary | ICD-10-CM | POA: Diagnosis present

## 2013-12-18 NOTE — ED Notes (Signed)
Pt c/o left testicle pain and blood in urine  x 3 days w/o swelling.

## 2013-12-19 ENCOUNTER — Emergency Department (HOSPITAL_BASED_OUTPATIENT_CLINIC_OR_DEPARTMENT_OTHER): Payer: 59

## 2013-12-19 LAB — URINALYSIS, ROUTINE W REFLEX MICROSCOPIC
Glucose, UA: NEGATIVE mg/dL
Hgb urine dipstick: NEGATIVE
KETONES UR: NEGATIVE mg/dL
LEUKOCYTES UA: NEGATIVE
NITRITE: NEGATIVE
PH: 5 (ref 5.0–8.0)
Protein, ur: NEGATIVE mg/dL
Specific Gravity, Urine: 1.031 — ABNORMAL HIGH (ref 1.005–1.030)
Urobilinogen, UA: 0.2 mg/dL (ref 0.0–1.0)

## 2013-12-19 MED ORDER — HYDROMORPHONE HCL 1 MG/ML IJ SOLN
1.0000 mg | Freq: Once | INTRAMUSCULAR | Status: AC
  Administered 2013-12-19: 1 mg via INTRAVENOUS
  Filled 2013-12-19: qty 1

## 2013-12-19 MED ORDER — ONDANSETRON HCL 4 MG/2ML IJ SOLN
4.0000 mg | Freq: Once | INTRAMUSCULAR | Status: AC
  Administered 2013-12-19: 4 mg via INTRAVENOUS
  Filled 2013-12-19: qty 2

## 2013-12-19 MED ORDER — HYDROCODONE-ACETAMINOPHEN 5-325 MG PO TABS: 1.0000 | ORAL_TABLET | Freq: Four times a day (QID) | ORAL | Status: DC | PRN

## 2013-12-19 MED ORDER — SODIUM CHLORIDE 0.9 % IV SOLN
INTRAVENOUS | Status: DC
  Administered 2013-12-19: 125 mL/h via INTRAVENOUS

## 2013-12-19 MED ORDER — OXYCODONE-ACETAMINOPHEN 5-325 MG PO TABS: 2.0000 | ORAL_TABLET | Freq: Once | ORAL | Status: DC

## 2013-12-19 NOTE — ED Notes (Addendum)
Back from [place], alert, NAD, calm, interactive, restless in stretcher, "hurting", rates 8/10. Pinpoints to L groin and L lower back. (denies: fever, nvd, bleeding, constipation, radiation, loss of control of bowel and bladder or other sx), "not sure if there was blood in urine",   wife at [facility].

## 2013-12-19 NOTE — ED Notes (Signed)
Pt not in room, pt in US.  

## 2013-12-19 NOTE — ED Notes (Signed)
"  feel better", denies questions

## 2013-12-19 NOTE — ED Provider Notes (Signed)
CSN: [id number]     Arrival date & time 12/18/13  2354 History   First MD Initiated Contact with Patient 12/19/13 0002     Chief Complaint  Patient presents with  . Testicle Pain     (Consider location/radiation/quality/duration/timing/severity/associated sxs/prior Treatment) Patient is a 58 y.o. male presenting with testicular pain. The history is provided by the patient. No language interpreter was used.  Testicle Pain This is a new problem. Episode onset: 3 days. The problem occurs constantly. The problem has been gradually worsening. Pertinent negatives include no nausea. Nothing aggravates the symptoms. He has tried nothing for the symptoms. The treatment provided moderate relief.  Pt complains of pain in left testicle.  Pt reports pain is worse with standing.  Pt noticed some blood in urine  Past Medical History  Diagnosis Date  . Gout    History reviewed. No pertinent past surgical history. History reviewed. No pertinent family history. History  Substance Use Topics  . Smoking status: Never Smoker   . Smokeless tobacco: Not on file  . Alcohol Use: Yes    Review of Systems  Gastrointestinal: Negative for nausea.  Genitourinary: Positive for testicular pain.  All other systems reviewed and are negative.     Allergies  Amoxicillin; Aspirin; and Penicillins  Home Medications   Prior to Admission medications   Medication Sig Start Date End Date Taking? Authorizing Provider  traMADol (ULTRAM) 50 MG tablet Take 1 tablet (50 mg total) by mouth every 6 (six) hours as needed. 11/16/13   Gregor Hams, MD   BP 137/76 mmHg  Pulse 79  Temp(Src) 98.4 F (36.9 C) (Oral)  Resp 18  Ht 5\' 8"  (1.727 m)  Wt 280 lb (127.007 kg)  BMI 42.58 kg/m2  SpO2 97% Physical Exam  Constitutional: He appears well-developed and well-nourished.  HENT:  Head: Normocephalic and atraumatic.  Eyes: Pupils are equal, round, and reactive to light.  Neck: Normal range of motion.   Cardiovascular: Normal rate.   Pulmonary/Chest: Effort normal.  Abdominal: There is no tenderness.  Genitourinary: Penis normal.  No scrotal swelling  Musculoskeletal: Normal range of motion.  Neurological: He is alert.  Skin: Skin is warm.  Psychiatric: He has a normal mood and affect.  Nursing note and vitals reviewed.   ED Course  Procedures (including critical care time) Labs Review Labs Reviewed  URINALYSIS, ROUTINE W REFLEX MICROSCOPIC - Abnormal; Notable for the following:    Specific Gravity, Urine 1.031 (*)    Bilirubin Urine SMALL (*)    All other components within normal limits    Imaging Review US Scrotum  12/19/2013   CLINICAL DATA:  58 year old male with left scrotal pain for 2 days. Initial encounter.  EXAM: SCROTAL ULTRASOUND  DOPPLER ULTRASOUND OF THE TESTICLES  TECHNIQUE: Complete ultrasound examination of the testicles, epididymis, and other scrotal structures was performed. Color and spectral Doppler ultrasound were also utilized to evaluate blood flow to the testicles.  COMPARISON:  None.  FINDINGS: Right testicle  Measurements: 3.3 x 2 x 2.7 cm. No mass or microlithiasis visualized.  Left testicle  Measurements: 3.3 x 1.9 x 2.1 cm. No mass or microlithiasis visualized.  Right epididymis:  Small spermatoceles/epididymal cysts are noted.  Left epididymis:  Small spermatoceles/epididymal cysts are noted.  Hydrocele:  A small to moderate right hydrocele is noted.  Varicocele:  None visualized.  Pulsed Doppler interrogation of both testes demonstrates low resistance arterial and venous waveforms bilaterally.  IMPRESSION: Normal testicles bilaterally.  No evidence of  testicular torsion.  Small to moderate right hydrocele.   Electronically Signed   By: Hassan Rowan M.D.   On: 12/19/2013 01:08   [place] Art/ven Flow Abd Pelv Doppler  12/19/2013   CLINICAL DATA:  58 year old male with left scrotal pain for 2 days. Initial encounter.  EXAM: SCROTAL ULTRASOUND  DOPPLER ULTRASOUND OF  THE TESTICLES  TECHNIQUE: Complete ultrasound examination of the testicles, epididymis, and other scrotal structures was performed. Color and spectral Doppler ultrasound were also utilized to evaluate blood flow to the testicles.  COMPARISON:  None.  FINDINGS: Right testicle  Measurements: 3.3 x 2 x 2.7 cm. No mass or microlithiasis visualized.  Left testicle  Measurements: 3.3 x 1.9 x 2.1 cm. No mass or microlithiasis visualized.  Right epididymis:  Small spermatoceles/epididymal cysts are noted.  Left epididymis:  Small spermatoceles/epididymal cysts are noted.  Hydrocele:  A small to moderate right hydrocele is noted.  Varicocele:  None visualized.  Pulsed Doppler interrogation of both testes demonstrates low resistance arterial and venous waveforms bilaterally.  IMPRESSION: Normal testicles bilaterally.  No evidence of testicular torsion.  Small to moderate right hydrocele.   Electronically Signed   By: Hassan Rowan M.D.   On: 12/19/2013 01:08   Ct Renal Stone Study  12/19/2013   CLINICAL DATA:  Acute onset of left groin pain and left-sided back pain for 3 days. Initial encounter.  EXAM: CT ABDOMEN AND PELVIS WITHOUT CONTRAST  TECHNIQUE: Multidetector CT imaging of the abdomen and pelvis was performed following the standard protocol without IV contrast.  COMPARISON:  Scrotal ultrasound performed earlier today at 12:21 a.m.  FINDINGS: The visualized lung bases are clear.  The liver and spleen are unremarkable in appearance. The gallbladder is within normal limits. The pancreas and adrenal glands are unremarkable.  The kidneys are unremarkable in appearance. Minimal perinephric stranding is noted bilaterally, nonspecific in appearance. There is no evidence of hydronephrosis. No renal or ureteral stones are seen.  No free fluid is identified. The small bowel is unremarkable in appearance. The stomach is within normal limits. No acute vascular abnormalities are seen. Minimal calcification is seen at the aortic  bifurcation.  The appendix is normal in caliber and contains trace air, without evidence for appendicitis. The colon is unremarkable in appearance.  The bladder is mildly distended. Fluid within the bladder is of slightly high attenuation; this is of uncertain significance. The prostate is normal in size. A small left inguinal hernia is seen, containing only fat. No inguinal lymphadenopathy is seen.  No acute osseous abnormalities are identified.  IMPRESSION: 1. No acute abnormality seen to explain the patient's symptoms. 2. Small left inguinal hernia, containing only fat. No associated soft tissue inflammation seen to explain the patient's symptoms. 3. Fluid within the bladder is of slightly high attenuation; this is of uncertain significance, as the concurrent urinalysis demonstrates only minimal abnormalities.   Electronically Signed   By: Garald Balding M.D.   On: 12/19/2013 01:54     EKG Interpretation None      MDM  Pt's care turned over to Dr. Florina Ou Ct pending   Final diagnoses:  Groin pain  Left inguinal hernia        Fransico Meadow, PA-C 12/19/13 [place], PA-C 12/19/13 1339

## 2013-12-19 NOTE — ED Provider Notes (Signed)
Medical screening examination/treatment/procedure(s) were conducted as a shared visit with non-physician practitioner(s) and myself.  I personally evaluated the patient during the encounter.  Nursing notes and vitals signs, including pulse oximetry, reviewed.  Summary of this visit's results, reviewed by myself:  Labs:  Results for orders placed or performed during the hospital encounter of 12/18/13 (from the past 24 hour(s))  Urinalysis, Routine w reflex microscopic     Status: Abnormal   Collection Time: 12/19/13 12:03 AM  Result Value Ref Range   Color, Urine YELLOW YELLOW   APPearance CLEAR CLEAR   Specific Gravity, Urine 1.031 (H) 1.005 - 1.030   pH 5.0 5.0 - 8.0   Glucose, UA NEGATIVE NEGATIVE mg/dL   Hgb urine dipstick NEGATIVE NEGATIVE   Bilirubin Urine SMALL (A) NEGATIVE   Ketones, ur NEGATIVE NEGATIVE mg/dL   Protein, ur NEGATIVE NEGATIVE mg/dL   Urobilinogen, UA 0.2 0.0 - 1.0 mg/dL   Nitrite NEGATIVE NEGATIVE   Leukocytes, UA NEGATIVE NEGATIVE    Imaging Studies: US Scrotum  12/19/2013   CLINICAL DATA:  58 year old male with left scrotal pain for 2 days. Initial encounter.  EXAM: SCROTAL ULTRASOUND  DOPPLER ULTRASOUND OF THE TESTICLES  TECHNIQUE: Complete ultrasound examination of the testicles, epididymis, and other scrotal structures was performed. Color and spectral Doppler ultrasound were also utilized to evaluate blood flow to the testicles.  COMPARISON:  None.  FINDINGS: Right testicle  Measurements: 3.3 x 2 x 2.7 cm. No mass or microlithiasis visualized.  Left testicle  Measurements: 3.3 x 1.9 x 2.1 cm. No mass or microlithiasis visualized.  Right epididymis:  Small spermatoceles/epididymal cysts are noted.  Left epididymis:  Small spermatoceles/epididymal cysts are noted.  Hydrocele:  A small to moderate right hydrocele is noted.  Varicocele:  None visualized.  Pulsed Doppler interrogation of both testes demonstrates low resistance arterial and venous waveforms  bilaterally.  IMPRESSION: Normal testicles bilaterally.  No evidence of testicular torsion.  Small to moderate right hydrocele.   Electronically Signed   By: Hassan Rowan M.D.   On: 12/19/2013 01:08   [place] Art/ven Flow Abd Pelv Doppler  12/19/2013   CLINICAL DATA:  58 year old male with left scrotal pain for 2 days. Initial encounter.  EXAM: SCROTAL ULTRASOUND  DOPPLER ULTRASOUND OF THE TESTICLES  TECHNIQUE: Complete ultrasound examination of the testicles, epididymis, and other scrotal structures was performed. Color and spectral Doppler ultrasound were also utilized to evaluate blood flow to the testicles.  COMPARISON:  None.  FINDINGS: Right testicle  Measurements: 3.3 x 2 x 2.7 cm. No mass or microlithiasis visualized.  Left testicle  Measurements: 3.3 x 1.9 x 2.1 cm. No mass or microlithiasis visualized.  Right epididymis:  Small spermatoceles/epididymal cysts are noted.  Left epididymis:  Small spermatoceles/epididymal cysts are noted.  Hydrocele:  A small to moderate right hydrocele is noted.  Varicocele:  None visualized.  Pulsed Doppler interrogation of both testes demonstrates low resistance arterial and venous waveforms bilaterally.  IMPRESSION: Normal testicles bilaterally.  No evidence of testicular torsion.  Small to moderate right hydrocele.   Electronically Signed   By: Hassan Rowan M.D.   On: 12/19/2013 01:08   Ct Renal Stone Study  12/19/2013   CLINICAL DATA:  Acute onset of left groin pain and left-sided back pain for 3 days. Initial encounter.  EXAM: CT ABDOMEN AND PELVIS WITHOUT CONTRAST  TECHNIQUE: Multidetector CT imaging of the abdomen and pelvis was performed following the standard protocol without IV contrast.  COMPARISON:  Scrotal ultrasound performed  earlier today at 12:21 a.m.  FINDINGS: The visualized lung bases are clear.  The liver and spleen are unremarkable in appearance. The gallbladder is within normal limits. The pancreas and adrenal glands are unremarkable.  The kidneys are  unremarkable in appearance. Minimal perinephric stranding is noted bilaterally, nonspecific in appearance. There is no evidence of hydronephrosis. No renal or ureteral stones are seen.  No free fluid is identified. The small bowel is unremarkable in appearance. The stomach is within normal limits. No acute vascular abnormalities are seen. Minimal calcification is seen at the aortic bifurcation.  The appendix is normal in caliber and contains trace air, without evidence for appendicitis. The colon is unremarkable in appearance.  The bladder is mildly distended. Fluid within the bladder is of slightly high attenuation; this is of uncertain significance. The prostate is normal in size. A small left inguinal hernia is seen, containing only fat. No inguinal lymphadenopathy is seen.  No acute osseous abnormalities are identified.  IMPRESSION: 1. No acute abnormality seen to explain the patient's symptoms. 2. Small left inguinal hernia, containing only fat. No associated soft tissue inflammation seen to explain the patient's symptoms. 3. Fluid within the bladder is of slightly high attenuation; this is of uncertain significance, as the concurrent urinalysis demonstrates only minimal abnormalities.   Electronically Signed   By: Garald Balding M.D.   On: 12/19/2013 01:54    2:00 AM No palpable left inguinal hernia or tenderness. Patient advised of CT findings. Will refer to surgery if symptoms do not improve. Patient was advised that pain medication may cause constipation which can worsen hernia.    [name] Chafe , MD 12/19/13 0200

## 2013-12-20 ENCOUNTER — Encounter: Payer: Self-pay | Admitting: Endocrinology

## 2013-12-20 ENCOUNTER — Ambulatory Visit (INDEPENDENT_AMBULATORY_CARE_PROVIDER_SITE_OTHER): Payer: 59 | Admitting: Endocrinology

## 2013-12-20 VITALS — BP 107/66 | HR 75 | Temp 97.9°F | Ht 68.0 in | Wt 266.0 lb

## 2013-12-20 DIAGNOSIS — K4090 Unilateral inguinal hernia, without obstruction or gangrene, not specified as recurrent: Secondary | ICD-10-CM

## 2013-12-20 MED ORDER — FEBUXOSTAT 40 MG PO TABS: 40.0000 mg | ORAL_TABLET | Freq: Every day | ORAL | Status: DC

## 2013-12-20 MED ORDER — PIOGLITAZONE HCL 15 MG PO TABS: 15.0000 mg | ORAL_TABLET | Freq: Every day | ORAL | Status: DC

## 2013-12-20 NOTE — Progress Notes (Signed)
   Subjective:    Patient ID: David Wall, male    DOB: 29-Jan-1955, 58 y.o.   MRN: [id number]  HPI Pt reports few days of moderate pain at the left inguinal area, possibly precip by lifting.  No assoc hematuria.  He works in International aid/development worker.  He was seen in ER, where he was rx'ed vicodin, and ref to surgery.   Past Medical History  Diagnosis Date  . Gout     No past surgical history on file.  History   Social History  . Marital Status: Married    Spouse Name: N/A    Number of Children: N/A  . Years of Education: N/A   Occupational History  . Not on file.   Social History Main Topics  . Smoking status: Never Smoker   . Smokeless tobacco: Not on file  . Alcohol Use: Yes  . Drug Use: No  . Sexual Activity: Not on file   Other Topics Concern  . Not on file   Social History Narrative    Current Outpatient Prescriptions on File Prior to Visit  Medication Sig Dispense Refill  . HYDROcodone-acetaminophen (NORCO/VICODIN) 5-325 MG per tablet Take 1-2 tablets by mouth every 6 (six) hours as needed (for pain; may cause constipation). 20 tablet 0  . traMADol (ULTRAM) 50 MG tablet Take 1 tablet (50 mg total) by mouth every 6 (six) hours as needed. 15 tablet 0   No current facility-administered medications on file prior to visit.    Allergies  Allergen Reactions  . Amoxicillin Shortness Of Breath  . Aspirin Shortness Of Breath  . Penicillins Shortness Of Breath  . Allopurinol Rash    No family history on file.  BP 107/66 mmHg  Pulse 75  Temp(Src) 97.9 F (36.6 C) (Oral)  Ht 5\' 8"  (1.727 m)  Wt 266 lb (120.657 kg)  BMI 40.45 kg/m2  SpO2 96%  Review of Systems Denies rash, chest pain, sob, and n/v.      Objective:   Physical Exam VITAL SIGNS:  See vs page GENERAL: no distress Left inguinal area: slight tenderness and a small reducible hernia.     Lab Results  Component Value Date   HGBA1C 6.9* 10/10/2013   i reviewed electrocardiogram 12/19/13.      Assessment & Plan:  inguinal hernia, new to me.  At pt's request, I called [facility], to request sooner appt.  Gout: therapy limited by noncompliance.  i'll do the best i can.    Patient is advised the following: Patient Instructions  i have requested a sooner apt with a surgeon.  you will receive a phone call, about a day and time for an appointment. i have sent a prescription to your pharmacy, for the uloric. I hope you feel better soon.  If you don't feel better by next week, please call back.  Please call sooner if you get worse.

## 2013-12-20 NOTE — Patient Instructions (Signed)
i have requested a sooner apt with a Psychologist, sport and exercise.  you will receive a phone call, about a day and time for an appointment. i have sent a prescription to your pharmacy, for the uloric. I hope you feel better soon.  If you don't feel better by next week, please call back.  Please call sooner if you get worse.

## 2013-12-21 DIAGNOSIS — K4090 Unilateral inguinal hernia, without obstruction or gangrene, not specified as recurrent: Secondary | ICD-10-CM | POA: Insufficient documentation

## 2014-02-06 ENCOUNTER — Encounter: Payer: Self-pay | Admitting: Gastroenterology

## 2014-02-22 ENCOUNTER — Ambulatory Visit: Payer: Self-pay | Admitting: Podiatry

## 2014-04-25 ENCOUNTER — Encounter: Payer: Self-pay | Admitting: Endocrinology

## 2014-04-25 ENCOUNTER — Telehealth: Payer: Self-pay | Admitting: Endocrinology

## 2014-04-25 ENCOUNTER — Ambulatory Visit (INDEPENDENT_AMBULATORY_CARE_PROVIDER_SITE_OTHER): Payer: 59 | Admitting: Endocrinology

## 2014-04-25 VITALS — BP 122/80 | HR 74 | Temp 98.2°F | Ht 68.0 in | Wt 258.0 lb

## 2014-04-25 DIAGNOSIS — E119 Type 2 diabetes mellitus without complications: Secondary | ICD-10-CM

## 2014-04-25 DIAGNOSIS — G479 Sleep disorder, unspecified: Secondary | ICD-10-CM | POA: Insufficient documentation

## 2014-04-25 LAB — HEMOGLOBIN A1C: Hgb A1c MFr Bld: 6.2 % (ref 4.6–6.5)

## 2014-04-25 MED ORDER — TAMSULOSIN HCL 0.4 MG PO CAPS: 0.4000 mg | ORAL_CAPSULE | Freq: Every day | ORAL | Status: DC

## 2014-04-25 MED ORDER — PIOGLITAZONE HCL 15 MG PO TABS: 15.0000 mg | ORAL_TABLET | Freq: Every day | ORAL | Status: DC

## 2014-04-25 MED ORDER — FEBUXOSTAT 40 MG PO TABS: 40.0000 mg | ORAL_TABLET | Freq: Every day | ORAL | Status: DC

## 2014-04-25 NOTE — Patient Instructions (Addendum)
Please consider having weight loss surgery.  It is good for your health.  Here is some information about it.  If you decide to consider further, please call the phone number in the papers, and register for a free informational meeting.  blood tests are requested for you today.  We'll let you know about the results.  Please resume the uloric and pioglitizone.  i have sent prescriptions to your pharmacy. Please see a sleep specialist.  you will receive a phone call, about a day and time for an appointment Please come back for a regular physical appointment in 6 months.   i have also sent a prescription to your pharmacy, for the prostate, on a trial basis.

## 2014-04-25 NOTE — Telephone Encounter (Signed)
Patient stated that Ins need prior Auth on medication Uloric medication

## 2014-04-25 NOTE — Telephone Encounter (Signed)
PA received and placed on MD's Desk for review.

## 2014-04-25 NOTE — Progress Notes (Signed)
   Subjective:    Patient ID: David Wall, male    DOB: 19-Sep-1955, 59 y.o.   MRN: [id number]  HPI Pt returns for f/u of diabetes mellitus: DM type: 2 Dx'ed: 7591 Complications: none Therapy: pioglitizone DKA: never Severe hypoglycemia: never Pancreatitis: never Other: he has never been on insulin Interval history: Pt states fatigue. Wife says he snores He reports urinary frequency.  He stopped actos.   Gout: no recent sxs.  He went off uloric Past Medical History  Diagnosis Date  . Gout     No past surgical history on file.  History   Social History  . Marital Status: Married    Spouse Name: N/A  . Number of Children: N/A  . Years of Education: N/A   Occupational History  . Not on file.   Social History Main Topics  . Smoking status: Never Smoker   . Smokeless tobacco: Not on file  . Alcohol Use: Yes  . Drug Use: No  . Sexual Activity: Not on file   Other Topics Concern  . Not on file   Social History Narrative    Current Outpatient Prescriptions on File Prior to Visit  Medication Sig Dispense Refill  . traMADol (ULTRAM) 50 MG tablet Take 1 tablet (50 mg total) by mouth every 6 (six) hours as needed. 15 tablet 0   No current facility-administered medications on file prior to visit.    Allergies  Allergen Reactions  . Amoxicillin Shortness Of Breath  . Aspirin Shortness Of Breath  . Penicillins Shortness Of Breath  . Allopurinol Rash    No family history on file.  BP 122/80 mmHg  Pulse 74  Temp(Src) 98.2 F (36.8 C) (Oral)  Ht 5\' 8"  (1.727 m)  Wt 258 lb (117.028 kg)  BMI 39.24 kg/m2  SpO2 95%    Review of Systems Right knee pain persists (sees ortho).  no weight change    Objective:   Physical Exam VITAL SIGNS:  See vs page GENERAL: no distress Pulses: dorsalis pedis intact bilat.   MSK: no deformity of the feet CV: no leg edema Skin:  no ulcer on the feet.  normal color and temp on the feet. Neuro: sensation is intact to touch  on the feet   Lab Results  Component Value Date   HGBA1C 6.2 04/25/2014      Assessment & Plan:  Sleep disorder, new Obesity: persistent Urinary sxs, new, uncertain etiology DM: well-controlled   Patient is advised the following: Patient Instructions  Please consider having weight loss surgery.  It is good for your health.  Here is some information about it.  If you decide to consider further, please call the phone number in the papers, and register for a free informational meeting.  blood tests are requested for you today.  We'll let you know about the results.  Please resume the uloric and pioglitizone.  i have sent prescriptions to your pharmacy. Please see a sleep specialist.  you will receive a phone call, about a day and time for an appointment Please come back for a regular physical appointment in 6 months.   i have also sent a prescription to your pharmacy, for the prostate, on a trial basis.

## 2014-04-26 DIAGNOSIS — Z0279 Encounter for issue of other medical certificate: Secondary | ICD-10-CM

## 2014-04-27 DIAGNOSIS — Z0279 Encounter for issue of other medical certificate: Secondary | ICD-10-CM

## 2014-04-28 ENCOUNTER — Emergency Department (HOSPITAL_BASED_OUTPATIENT_CLINIC_OR_DEPARTMENT_OTHER)
Admission: EM | Admit: 2014-04-28 | Discharge: 2014-04-28 | Disposition: A | Discharge status: Home / Self Care | Payer: BLUE CROSS/BLUE SHIELD | Attending: Emergency Medicine | Admitting: Emergency Medicine

## 2014-04-28 ENCOUNTER — Emergency Department (HOSPITAL_BASED_OUTPATIENT_CLINIC_OR_DEPARTMENT_OTHER): Payer: BLUE CROSS/BLUE SHIELD

## 2014-04-28 ENCOUNTER — Encounter (HOSPITAL_BASED_OUTPATIENT_CLINIC_OR_DEPARTMENT_OTHER): Payer: Self-pay

## 2014-04-28 DIAGNOSIS — M25461 Effusion, right knee: Secondary | ICD-10-CM | POA: Insufficient documentation

## 2014-04-28 DIAGNOSIS — Z880 Allergy status to penicillin: Secondary | ICD-10-CM | POA: Diagnosis not present

## 2014-04-28 DIAGNOSIS — Z79899 Other long term (current) drug therapy: Secondary | ICD-10-CM | POA: Diagnosis not present

## 2014-04-28 DIAGNOSIS — M7121 Synovial cyst of popliteal space [Baker], right knee: Secondary | ICD-10-CM | POA: Insufficient documentation

## 2014-04-28 DIAGNOSIS — M79606 Pain in leg, unspecified: Secondary | ICD-10-CM

## 2014-04-28 DIAGNOSIS — R2241 Localized swelling, mass and lump, right lower limb: Secondary | ICD-10-CM | POA: Diagnosis present

## 2014-04-28 MED ORDER — NAPROXEN 500 MG PO TABS: 500.0000 mg | ORAL_TABLET | Freq: Two times a day (BID) | ORAL | Status: DC

## 2014-04-28 NOTE — Discharge Instructions (Signed)
Take naproxen as prescribed.  Baker Cyst A Baker cyst is a sac-like structure that forms in the back of the knee. It is filled with the same fluid that is located in your knee. This fluid lubricates the bones and cartilage of the knee and allows them to move over each other more easily. CAUSES  When the knee becomes injured or inflamed, increased fluid forms in the knee. When this happens, the joint lining is pushed out behind the knee and forms the Baker cyst. This cyst may also be caused by inflammation from arthritic conditions and infections. SIGNS AND SYMPTOMS  A Baker cyst usually has no symptoms. When the cyst is substantially enlarged:  You may feel pressure behind the knee, stiffness in the knee, or a mass in the area behind the knee.  You may develop pain, redness, and swelling in the calf. This can suggest a blood clot and requires evaluation by your health care provider. DIAGNOSIS  A Baker cyst is most often found during an ultrasound exam. This exam may have been performed for other reasons, and the cyst was found incidentally. Sometimes an MRI is used. This picks up other problems within a joint that an ultrasound exam may not. If the Baker cyst developed immediately after an injury, X-ray exams may be used to diagnose the cyst. TREATMENT  The treatment depends on the cause of the cyst. Anti-inflammatory medicines and rest often will be prescribed. If the cyst is caused by a bacterial infection, antibiotic medicines may be prescribed.  HOME CARE INSTRUCTIONS   If the cyst was caused by an injury, for the first 24 hours, keep the injured leg elevated on 2 pillows while lying down.  For the first 24 hours while you are awake, apply ice to the injured area:  Put ice in a plastic bag.  Place a towel between your skin and the bag.  Leave the ice on for 20 minutes, 2-3 times a day.  Only take over-the-counter or prescription medicines for pain, discomfort, or fever as directed by  your health care provider.  Only take antibiotic medicine as directed. Make sure to finish it even if you start to feel better. MAKE SURE YOU:   Understand these instructions.  Will watch your condition.  Will get help right away if you are not doing well or get worse. Document Released: 01/05/2005 Document Revised: 10/26/2012 Document Reviewed: 08/17/2012 [facility] Patient Information 2015 [place], [place]. This information is not intended to replace advice given to you by your health care provider. Make sure you discuss any questions you have with your health care provider.  Knee Effusion The medical term for having fluid in your knee is effusion. This is often due to an internal derangement of the knee. This means something is wrong inside the knee. Some of the causes of fluid in the knee may be torn cartilage, a torn ligament, or bleeding into the joint from an injury. Your knee is likely more difficult to bend and move. This is often because there is increased pain and pressure in the joint. The time it takes for recovery from a knee effusion depends on different factors, including:   Type of injury.  Your age.  Physical and medical conditions.  Rehabilitation Strategies. How long you will be away from your normal activities will depend on what kind of knee problem you have and how much damage is present. Your knee has two types of cartilage. Articular cartilage covers the bone ends and lets your  knee bend and move smoothly. Two menisci, thick pads of cartilage that form a rim inside the joint, help absorb shock and stabilize your knee. Ligaments bind the bones together and support your knee joint. Muscles move the joint, help support your knee, and take stress off the joint itself. CAUSES  Often an effusion in the knee is caused by an injury to one of the menisci. This is often a tear in the cartilage. Recovery after a meniscus injury depends on how much meniscus is damaged and whether  you have damaged other knee tissue. Small tears may heal on their own with conservative treatment. Conservative means rest, limited weight bearing activity and muscle strengthening exercises. Your recovery may take up to 6 weeks.  TREATMENT  Larger tears may require surgery. Meniscus injuries may be treated during arthroscopy. Arthroscopy is a procedure in which your surgeon uses a small telescope like instrument to look in your knee. Your caregiver can make a more accurate diagnosis (learning what is wrong) by performing an arthroscopic procedure. If your injury is on the inner margin of the meniscus, your surgeon may trim the meniscus back to a smooth rim. In other cases your surgeon will try to repair a damaged meniscus with stitches (sutures). This may make rehabilitation take longer, but may provide better long term result by helping your knee keep its shock absorption capabilities. Ligaments which are completely torn usually require surgery for repair. HOME CARE INSTRUCTIONS  Use crutches as instructed.  If a brace is applied, use as directed.  Once you are home, an ice pack applied to your swollen knee may help with discomfort and help decrease swelling.  Keep your knee raised (elevated) when you are not up and around or on crutches.  Only take over-the-counter or prescription medicines for pain, discomfort, or fever as directed by your caregiver.  Your caregivers will help with instructions for rehabilitation of your knee. This often includes strengthening exercises.  You may resume a normal diet and activities as directed. SEEK MEDICAL CARE IF:   There is increased swelling in your knee.  You notice redness, swelling, or increasing pain in your knee.  An unexplained oral temperature above 102 F (38.9 C) develops. SEEK IMMEDIATE MEDICAL CARE IF:   You develop a rash.  You have difficulty breathing.  You have any allergic reactions from medications you may have been  given.  There is severe pain with any motion of the knee. MAKE SURE YOU:   Understand these instructions.  Will watch your condition.  Will get help right away if you are not doing well or get worse. Document Released: 03/28/2003 Document Revised: 03/30/2011 Document Reviewed: 06/01/2007 [facility] Patient Information 2015 [place], [place]. This information is not intended to replace advice given to you by your health care provider. Make sure you discuss any questions you have with your health care provider.

## 2014-04-28 NOTE — ED Notes (Signed)
Pt reports 1 week of pain in right leg from hip down to foot, reports posterior calf is most tender, has swelling to leg.  Denies cp or sob.

## 2014-04-28 NOTE — ED Notes (Signed)
Pt w/ rt knee pain x1 week and swelling. Pt denies any mechanism of injury. Mild swelling noted, tenderness upon palpation.

## 2014-04-28 NOTE — ED Provider Notes (Signed)
CSN: [id number]     Arrival date & time 04/28/14  1646 History   First MD Initiated Contact with Patient 04/28/14 1730     Chief Complaint  Patient presents with  . Leg Swelling     (Consider location/radiation/quality/duration/timing/severity/associated sxs/prior Treatment) HPI Comments: 59 year old male presents today for right knee swelling and right posterior thigh pain x 1 week. Describes posterior thigh pain as a "cramping" and "feels like a charlie horse". Is having 10-12 cramping episodes per day that also wake him from sleep. Pain is localized and currently 7/10, but will increase to 9/10 with cramping. No trauma to the area. Denies numbness and tingling in legs. Increased pain and cramping when moving from sitting to standing position. Had fluid drained from knee and a cortisone injection on Wednesday this week by Dr. Ninfa Linden with orthopedics with temporary relief, however the pain has returned. Knee will "lock up" occasionally, but has not caused him to fall. Denies fever. Not currently on any blood pressure or cholesterol medications, no history of blood clots, is on his feet a lot at work. Has used bengay with no relief and ibuprofen with minor relief.  The history is provided by the patient.    Past Medical History  Diagnosis Date  . Gout    History reviewed. No pertinent past surgical history. No family history on file. History  Substance Use Topics  . Smoking status: Never Smoker   . Smokeless tobacco: Not on file  . Alcohol Use: Yes     Comment: occ    Review of Systems  Musculoskeletal:       +R knee pain/swelling. +R posterior thigh pain.  All other systems reviewed and are negative.     Allergies  Amoxicillin; Aspirin; Penicillins; and Allopurinol  Home Medications   Prior to Admission medications   Medication Sig Start Date End Date Taking? Authorizing Provider  febuxostat (ULORIC) 40 MG tablet Take 1 tablet (40 mg total) by mouth daily. 04/25/14   Renato Shin, MD  naproxen (NAPROSYN) 500 MG tablet Take 1 tablet (500 mg total) by mouth 2 (two) times daily. 04/28/14    M , PA-C  pioglitazone (ACTOS) 15 MG tablet Take 1 tablet (15 mg total) by mouth daily. 04/25/14   Renato Shin, MD  tamsulosin (FLOMAX) 0.4 MG CAPS capsule Take 1 capsule (0.4 mg total) by mouth daily. 04/25/14   Renato Shin, MD  traMADol (ULTRAM) 50 MG tablet Take 1 tablet (50 mg total) by mouth every 6 (six) hours as needed. 11/16/13   Gregor Hams, MD   BP 106/57 mmHg  Pulse 64  Temp(Src) 98.2 F (36.8 C) (Oral)  Resp 18  Ht 5\' 8"  (1.727 m)  Wt 258 lb (117.028 kg)  BMI 39.24 kg/m2  SpO2 96% Physical Exam  Constitutional: He is oriented to person, place, and time. He appears well-developed and well-nourished. No distress.  HENT:  Head: Normocephalic and atraumatic.  Eyes: Conjunctivae and EOM are normal.  Neck: Normal range of motion. Neck supple.  Cardiovascular: Normal rate, regular rhythm, normal heart sounds and intact distal pulses.   Pulmonary/Chest: Effort normal and breath sounds normal.  Musculoskeletal:  R knee TTP over lateral joint line and infrapatellar. Moderate edema. FROM, pain in knee with flexion beyond 90 degrees. No ligamentous laxity. TTP mid-posterior thigh. No swelling or palpable cords. Negative Homan's sign. Sensation intact.  Neurological: He is alert and oriented to person, place, and time.  Skin: Skin is warm and dry.  Psychiatric: He has a normal mood and affect. His behavior is normal.  Nursing note and vitals reviewed.   ED Course  Procedures (including critical care time) Labs Review Labs Reviewed - No data to display  Imaging Review US Venous Img Lower Unilateral Right  04/28/2014   CLINICAL DATA:  Right knee pain for 1 week.  Swelling.  EXAM: Right LOWER EXTREMITY VENOUS DOPPLER ULTRASOUND  TECHNIQUE: Gray-scale sonography with graded compression, as well as color Doppler and duplex ultrasound were performed to evaluate  the lower extremity deep venous systems from the level of the common femoral vein and including the common femoral, femoral, profunda femoral, popliteal and calf veins including the posterior tibial, peroneal and gastrocnemius veins when visible. The superficial great saphenous vein was also interrogated. Spectral Doppler was utilized to evaluate flow at rest and with distal augmentation maneuvers in the common femoral, femoral and popliteal veins.  COMPARISON:  None.  FINDINGS: Contralateral Common Femoral Vein: Respiratory phasicity is normal and symmetric with the symptomatic side. No evidence of thrombus. Normal compressibility.  Common Femoral Vein: No evidence of thrombus. Normal compressibility, respiratory phasicity and response to augmentation.  Saphenofemoral Junction: No evidence of thrombus. Normal compressibility and flow on color Doppler imaging.  Profunda Femoral Vein: No evidence of thrombus. Normal compressibility and flow on color Doppler imaging.  Femoral Vein: No evidence of thrombus. Normal compressibility, respiratory phasicity and response to augmentation.  Popliteal Vein: No evidence of thrombus. Normal compressibility, respiratory phasicity and response to augmentation.  Calf Veins: No evidence of thrombus. Normal compressibility and flow on color Doppler imaging.  Superficial Great Saphenous Vein: No evidence of thrombus. Normal compressibility and flow on color Doppler imaging.  Venous Reflux:  None.  Other Findings: A prominent Baker's cyst measures 11.2 x 2.4 x 1.9 cm.  IMPRESSION: 1. No evidence for deep venous thrombosis. 2. Prominent Baker's cyst.   Electronically Signed   By: San Morelle M.D.   On: 04/28/2014 19:04   Dg Knee Complete 4 Views Right  04/28/2014   CLINICAL DATA:  Right knee pain for 1 week, cramping, no known injury  EXAM: RIGHT KNEE - COMPLETE 4+ VIEW  COMPARISON:  None.  FINDINGS: Four views of the right knee submitted. Mild narrowing of medial joint  compartment. Small joint effusion. Mild narrowing of patellofemoral joint space. No acute fracture or subluxation. Mild spurring of patella.  IMPRESSION: No acute fracture or subluxation. Mild degenerative changes as described above.   Electronically Signed   By: Lahoma Crocker M.D.   On: 04/28/2014 18:49     EKG Interpretation None      MDM   Final diagnoses:  Baker's cyst, right  Knee effusion, right   Non-toxic appearing, NAD. AFVSS. Neurovascularly intact. Calf tenderness. No palpable cords. Negative Homan's. Edema noted in right knee. Cannot relate posterior thigh pain to the knee swelling. Will obtain lower extremity venous duplex to rule out DVT, and x-ray of knee. Patient agreeable to plan.  7:14 PM Ultrasound showing no evidence for DVT. There is a prominent Baker's cyst. X-ray showing no acute fracture or subluxation, mild degenerative changes and small joint effusion. I discussed medical treatment, and advised patient to follow-up with his orthopedist. Stable for discharge. Return precautions given. Patient states understanding of treatment care plan and is agreeable.  Carman Ching, PA-C 04/28/14 1915  Charlesetta Shanks, MD 04/28/14 2154

## 2014-05-02 ENCOUNTER — Other Ambulatory Visit: Payer: Self-pay | Admitting: Physician Assistant

## 2014-05-02 ENCOUNTER — Telehealth: Payer: Self-pay | Admitting: Endocrinology

## 2014-05-02 DIAGNOSIS — M25561 Pain in right knee: Secondary | ICD-10-CM

## 2014-05-02 NOTE — Telephone Encounter (Signed)
Pt is stating that he got a call from [employer] regarding the uric acid but i did not see this in his chart

## 2014-05-02 NOTE — Telephone Encounter (Signed)
Pt needs uric acid alternate because insurance is not paying as much as you told him

## 2014-05-03 NOTE — Telephone Encounter (Signed)
Does this mean PA was denied?

## 2014-05-03 NOTE — Telephone Encounter (Signed)
Pa was approved but the co-payment was still too high.

## 2014-05-03 NOTE — Telephone Encounter (Signed)
Patient is returning your call.  

## 2014-05-03 NOTE — Telephone Encounter (Signed)
Left voicemail advising pt we submitted a PA for Uloric. We have not received information back from the pt's insurance stating the medication had been approved or denied. I advised pt if the medication had been approved and the cost was still too expensive we have discount cards available if he wanted one. Requested call back from pt to let us know if he would like a discount card.

## 2014-05-03 NOTE — Telephone Encounter (Signed)
Given that the other med (allopurinol) gave you a rash, the only other option is an anti-gout diet. i can give to you at your next ov, or you can find on the internet, or we can fax to you

## 2014-05-03 NOTE — Telephone Encounter (Signed)
Contacted pt. He states after the PA Uloric is going to cost him 100$. Pt states that he cannot afford this and the discount card will not be accepted. Pt wanted to know if there is a alternative medication he could take? Please advise, Thanks!

## 2014-05-04 NOTE — Telephone Encounter (Signed)
Contacted pt advised of note below. Pt stated he would look up the gout diet on the Internet.

## 2014-05-13 ENCOUNTER — Ambulatory Visit
Admission: RE | Admit: 2014-05-13 | Discharge: 2014-05-13 | Disposition: A | Discharge status: Home / Self Care | Payer: BLUE CROSS/BLUE SHIELD | Source: Ambulatory Visit | Attending: Physician Assistant | Admitting: Physician Assistant

## 2014-05-13 DIAGNOSIS — M25561 Pain in right knee: Secondary | ICD-10-CM

## 2014-05-31 ENCOUNTER — Other Ambulatory Visit (HOSPITAL_COMMUNITY): Payer: Self-pay | Admitting: Orthopaedic Surgery

## 2014-06-06 ENCOUNTER — Other Ambulatory Visit (HOSPITAL_COMMUNITY): Payer: Self-pay | Admitting: Orthopaedic Surgery

## 2014-06-11 NOTE — Progress Notes (Signed)
ekg 9/15 epic

## 2014-06-11 NOTE — Patient Instructions (Addendum)
Your procedure is scheduled on:  06/15/14  FRIDAY  Report to [facility] at   [place] AM.   Call this number if you have problems the morning of surgery: [phone]        Do not eat food  Or drink :After Midnight.THURSDAY NIGHT Take these medicines the morning of surgery with A SIP OF WATER:Hydrocodone-Acetaminophen if needed;   DO NOT TAKE ANY DIABETIC MEDICATION Friday MORNING.  Contacts, dentures or partial plates, or metal hairpins  can not be worn to surgery. Your family will be responsible for glasses, dentures, hearing aides while you are in surgery  Leave suitcase in the car. After surgery it may be brought to your room.  For patients admitted to the hospital, checkout time is 11:00 AM day of  discharge.         [facility] IS NOT RESPONSIBLE FOR ANY VALUABLES                                                              [facility] - Preparing for Surgery Before surgery, you can play an important role.  Because skin is not sterile, your skin needs to be as free of germs as possible.  You can reduce the number of germs on your skin by washing with CHG (chlorahexidine gluconate) soap before surgery.  CHG is an antiseptic cleaner which kills germs and bonds with the skin to continue killing germs even after washing. Please DO NOT use if you have an allergy to CHG or antibacterial soaps.  If your skin becomes reddened/irritated stop using the CHG and inform your nurse when you arrive at Short Stay. Do not shave (including legs and underarms) for at least 48 hours prior to the first CHG shower.  You may shave your face/neck. Please follow these instructions carefully:  1.  Shower with CHG Soap the night before surgery and the  morning of Surgery.  2.  If you choose to wash your hair, wash your hair first as usual with your  normal  shampoo.  3.  After you shampoo, rinse your hair and body thoroughly to remove  the  shampoo.                           4.  Use CHG as you would any other liquid soap.  You can apply chg directly  to the skin and wash                       Gently with a scrungie or clean washcloth.  5.  Apply the CHG Soap to your body ONLY FROM THE NECK DOWN.   Do not use on face/ open                           Wound or open sores. Avoid contact with eyes, ears mouth and genitals (private parts).                       Wash face,  Genitals (private parts) with your normal soap.  6.  Wash thoroughly, paying special attention to the area where your surgery  will be performed.  7.  Thoroughly rinse your body with warm water from the neck down.  8.  DO NOT shower/wash with your normal soap after using and rinsing off  the CHG Soap.                9.  Pat yourself dry with a clean towel.            10.  Wear clean pajamas.            11.  Place clean sheets on your bed the night of your first shower and do not  sleep with pets. Day of Surgery : Do not apply any lotions/deodorants the morning of surgery.  Please wear clean clothes to the hospital/surgery center.  FAILURE TO FOLLOW THESE INSTRUCTIONS MAY RESULT IN THE CANCELLATION OF YOUR SURGERY PATIENT SIGNATURE_________________________________  NURSE SIGNATURE__________________________________  ________________________________________________________________________

## 2014-06-12 ENCOUNTER — Encounter (HOSPITAL_COMMUNITY)
Admission: RE | Admit: 2014-06-12 | Discharge: 2014-06-12 | Disposition: A | Discharge status: Home / Self Care | Payer: BLUE CROSS/BLUE SHIELD | Source: Ambulatory Visit | Attending: Orthopaedic Surgery | Admitting: Orthopaedic Surgery

## 2014-06-12 ENCOUNTER — Encounter (HOSPITAL_COMMUNITY): Payer: Self-pay

## 2014-06-12 DIAGNOSIS — M1711 Unilateral primary osteoarthritis, right knee: Secondary | ICD-10-CM | POA: Insufficient documentation

## 2014-06-12 DIAGNOSIS — Z01812 Encounter for preprocedural laboratory examination: Secondary | ICD-10-CM | POA: Insufficient documentation

## 2014-06-12 HISTORY — DX: Type 2 diabetes mellitus without complications: E11.9

## 2014-06-12 HISTORY — DX: Personal history of other specified conditions: Z87.898

## 2014-06-12 HISTORY — DX: Unspecified asthma, uncomplicated: J45.909

## 2014-06-12 HISTORY — DX: Unspecified osteoarthritis, unspecified site: M19.90

## 2014-06-12 HISTORY — DX: Gastro-esophageal reflux disease without esophagitis: K21.9

## 2014-06-12 LAB — BASIC METABOLIC PANEL
ANION GAP: 11 (ref 5–15)
BUN: 18 mg/dL (ref 6–20)
CALCIUM: 9.4 mg/dL (ref 8.9–10.3)
CO2: 25 mmol/L (ref 22–32)
Chloride: 103 mmol/L (ref 101–111)
Creatinine, Ser: 0.78 mg/dL (ref 0.61–1.24)
Glucose, Bld: 103 mg/dL — ABNORMAL HIGH (ref 65–99)
Potassium: 3.9 mmol/L (ref 3.5–5.1)
SODIUM: 139 mmol/L (ref 135–145)

## 2014-06-12 LAB — SURGICAL PCR SCREEN
MRSA, PCR: NEGATIVE
Staphylococcus aureus: NEGATIVE

## 2014-06-12 LAB — ABO/RH: ABO/RH(D): A POS

## 2014-06-12 LAB — CBC
HEMATOCRIT: 39.3 % (ref 39.0–52.0)
HEMOGLOBIN: 12.7 g/dL — AB (ref 13.0–17.0)
MCH: 28.8 pg (ref 26.0–34.0)
MCHC: 32.3 g/dL (ref 30.0–36.0)
MCV: 89.1 fL (ref 78.0–100.0)
Platelets: 340 10*3/uL (ref 150–400)
RBC: 4.41 MIL/uL (ref 4.22–5.81)
RDW: 12.8 % (ref 11.5–15.5)
WBC: 7.9 10*3/uL (ref 4.0–10.5)

## 2014-06-12 LAB — PROTIME-INR
INR: 1.09 (ref 0.00–1.49)
Prothrombin Time: 14.3 seconds (ref 11.6–15.2)

## 2014-06-12 LAB — APTT: aPTT: 37 seconds (ref 24–37)

## 2014-06-12 NOTE — Progress Notes (Signed)
Your patient has screened at an elevated risk for Obstructive Sleep Apnea using the Stop-Bang Tool during a pre-surgical vist. A score of 4 or greater is an elevated risk. Score of 4. 

## 2014-06-14 ENCOUNTER — Encounter (HOSPITAL_COMMUNITY): Payer: Self-pay | Admitting: Anesthesiology

## 2014-06-14 NOTE — Anesthesia Preprocedure Evaluation (Addendum)
Anesthesia Evaluation  Patient identified by MRN, date of birth, ID band Patient awake    Reviewed: Allergy & Precautions, NPO status , Patient's Chart, lab work & pertinent test results  Airway Mallampati: II  TM Distance: >3 FB Neck ROM: Full    Dental no notable dental hx.    Pulmonary asthma ,  breath sounds clear to auscultation  Pulmonary exam normal       Cardiovascular negative cardio ROS Normal cardiovascular examRhythm:Regular Rate:Normal     Neuro/Psych  Neuromuscular disease negative psych ROS   GI/Hepatic Neg liver ROS, GERD-  ,  Endo/Other  diabetes, Type 2, Oral Hypoglycemic Agents  Renal/GU negative Renal ROS  negative genitourinary   Musculoskeletal  (+) Arthritis -,   Abdominal   Peds negative pediatric ROS (+)  Hematology negative hematology ROS (+)   Anesthesia Other Findings   Reproductive/Obstetrics negative OB ROS                            Anesthesia Physical Anesthesia Plan  ASA: II  Anesthesia Plan: Spinal   Post-op Pain Management:    Induction: Intravenous  Airway Management Planned:   Additional Equipment:   Intra-op Plan:   Post-operative Plan: Extubation in OR  Informed Consent: I have reviewed the patients History and Physical, chart, labs and discussed the procedure including the risks, benefits and alternatives for the proposed anesthesia with the patient or authorized representative who has indicated his/her understanding and acceptance.   Dental advisory given  Plan Discussed with: CRNA  Anesthesia Plan Comments: (Discussed risks and benefits of and differences between spinal and general. Discussed risks of spinal including headache, backache, failure, bleeding, infection, and nerve damage. Patient consents to spinal. Questions answered. Coagulation studies and platelet count acceptable.)       Anesthesia Quick Evaluation

## 2014-06-15 ENCOUNTER — Inpatient Hospital Stay (HOSPITAL_COMMUNITY): Payer: BLUE CROSS/BLUE SHIELD

## 2014-06-15 ENCOUNTER — Encounter (HOSPITAL_COMMUNITY): Payer: Self-pay | Admitting: *Deleted

## 2014-06-15 ENCOUNTER — Inpatient Hospital Stay (HOSPITAL_COMMUNITY)
Admission: RE | Admit: 2014-06-15 | Discharge: 2014-06-18 | DRG: 470 | Disposition: A | Discharge status: Home Health | Payer: BLUE CROSS/BLUE SHIELD | Source: Ambulatory Visit | Attending: Orthopaedic Surgery | Admitting: Orthopaedic Surgery

## 2014-06-15 ENCOUNTER — Inpatient Hospital Stay (HOSPITAL_COMMUNITY): Payer: BLUE CROSS/BLUE SHIELD | Admitting: Anesthesiology

## 2014-06-15 ENCOUNTER — Encounter (HOSPITAL_COMMUNITY)
Admission: RE | Disposition: A | Discharge status: Home Health | Payer: Self-pay | Source: Ambulatory Visit | Attending: Orthopaedic Surgery

## 2014-06-15 DIAGNOSIS — Z01812 Encounter for preprocedural laboratory examination: Secondary | ICD-10-CM

## 2014-06-15 DIAGNOSIS — E780 Pure hypercholesterolemia: Secondary | ICD-10-CM | POA: Diagnosis present

## 2014-06-15 DIAGNOSIS — E119 Type 2 diabetes mellitus without complications: Secondary | ICD-10-CM | POA: Diagnosis present

## 2014-06-15 DIAGNOSIS — M1711 Unilateral primary osteoarthritis, right knee: Secondary | ICD-10-CM

## 2014-06-15 DIAGNOSIS — Z79899 Other long term (current) drug therapy: Secondary | ICD-10-CM

## 2014-06-15 DIAGNOSIS — Z96651 Presence of right artificial knee joint: Secondary | ICD-10-CM

## 2014-06-15 DIAGNOSIS — M25561 Pain in right knee: Secondary | ICD-10-CM | POA: Diagnosis present

## 2014-06-15 DIAGNOSIS — K219 Gastro-esophageal reflux disease without esophagitis: Secondary | ICD-10-CM | POA: Diagnosis present

## 2014-06-15 HISTORY — PX: TOTAL KNEE ARTHROPLASTY: SHX125

## 2014-06-15 LAB — TYPE AND SCREEN
ABO/RH(D): A POS
Antibody Screen: NEGATIVE

## 2014-06-15 LAB — GLUCOSE, CAPILLARY
GLUCOSE-CAPILLARY: 106 mg/dL — AB (ref 65–99)
Glucose-Capillary: 92 mg/dL (ref 65–99)

## 2014-06-15 SURGERY — ARTHROPLASTY, KNEE, TOTAL
Anesthesia: Spinal | Site: Knee | Laterality: Right

## 2014-06-15 MED ORDER — SODIUM CHLORIDE 0.9 % IV SOLN
INTRAVENOUS | Status: DC
  Administered 2014-06-15 – 2014-06-16 (×2): via INTRAVENOUS

## 2014-06-15 MED ORDER — MENTHOL 3 MG MT LOZG
1.0000 | LOZENGE | OROMUCOSAL | Status: DC | PRN
  Filled 2014-06-15: qty 9

## 2014-06-15 MED ORDER — PROPOFOL 10 MG/ML IV BOLUS
INTRAVENOUS | Status: AC
  Filled 2014-06-15: qty 20

## 2014-06-15 MED ORDER — SODIUM CHLORIDE 0.9 % IJ SOLN
INTRAMUSCULAR | Status: AC
  Filled 2014-06-15: qty 10

## 2014-06-15 MED ORDER — SODIUM CHLORIDE 0.9 % IR SOLN
Status: DC | PRN
  Administered 2014-06-15: 2000 mL

## 2014-06-15 MED ORDER — CLINDAMYCIN PHOSPHATE 600 MG/50ML IV SOLN
600.0000 mg | Freq: Four times a day (QID) | INTRAVENOUS | Status: AC
  Administered 2014-06-15 (×2): 600 mg via INTRAVENOUS
  Filled 2014-06-15 (×3): qty 50

## 2014-06-15 MED ORDER — PHENOL 1.4 % MT LIQD
1.0000 | OROMUCOSAL | Status: DC | PRN
  Filled 2014-06-15: qty 177

## 2014-06-15 MED ORDER — FENTANYL CITRATE (PF) 100 MCG/2ML IJ SOLN
INTRAMUSCULAR | Status: DC | PRN
  Administered 2014-06-15 (×2): 50 ug via INTRAVENOUS

## 2014-06-15 MED ORDER — LACTATED RINGERS IV SOLN: INTRAVENOUS | Status: DC

## 2014-06-15 MED ORDER — MIDAZOLAM HCL 5 MG/5ML IJ SOLN
INTRAMUSCULAR | Status: DC | PRN
  Administered 2014-06-15: 2 mg via INTRAVENOUS

## 2014-06-15 MED ORDER — ONDANSETRON HCL 4 MG/2ML IJ SOLN
INTRAMUSCULAR | Status: AC
  Filled 2014-06-15: qty 2

## 2014-06-15 MED ORDER — TAMSULOSIN HCL 0.4 MG PO CAPS
0.4000 mg | ORAL_CAPSULE | Freq: Every day | ORAL | Status: DC
  Administered 2014-06-15 – 2014-06-18 (×4): 0.4 mg via ORAL
  Filled 2014-06-15 (×4): qty 1

## 2014-06-15 MED ORDER — HYDROMORPHONE HCL 1 MG/ML IJ SOLN: 0.2500 mg | INTRAMUSCULAR | Status: DC | PRN

## 2014-06-15 MED ORDER — DOCUSATE SODIUM 100 MG PO CAPS
100.0000 mg | ORAL_CAPSULE | Freq: Two times a day (BID) | ORAL | Status: DC
  Administered 2014-06-15 – 2014-06-18 (×6): 100 mg via ORAL
  Filled 2014-06-15 (×7): qty 1

## 2014-06-15 MED ORDER — OXYCODONE HCL 5 MG PO TABS
5.0000 mg | ORAL_TABLET | ORAL | Status: DC | PRN
  Administered 2014-06-15: 10 mg via ORAL
  Administered 2014-06-15 (×3): 5 mg via ORAL
  Administered 2014-06-15: 10 mg via ORAL
  Administered 2014-06-16 (×2): 15 mg via ORAL
  Administered 2014-06-16: 10 mg via ORAL
  Administered 2014-06-16 – 2014-06-18 (×10): 15 mg via ORAL
  Filled 2014-06-15: qty 3
  Filled 2014-06-15: qty 1
  Filled 2014-06-15: qty 3
  Filled 2014-06-15 (×2): qty 1
  Filled 2014-06-15: qty 3
  Filled 2014-06-15 (×2): qty 2
  Filled 2014-06-15 (×3): qty 3
  Filled 2014-06-15: qty 1
  Filled 2014-06-15: qty 3
  Filled 2014-06-15: qty 2
  Filled 2014-06-15 (×5): qty 3

## 2014-06-15 MED ORDER — RIVAROXABAN 10 MG PO TABS
10.0000 mg | ORAL_TABLET | Freq: Every day | ORAL | Status: DC
  Administered 2014-06-16 – 2014-06-18 (×3): 10 mg via ORAL
  Filled 2014-06-15 (×5): qty 1

## 2014-06-15 MED ORDER — PROPOFOL INFUSION 10 MG/ML OPTIME
INTRAVENOUS | Status: DC | PRN
  Administered 2014-06-15: 75 ug/kg/min via INTRAVENOUS

## 2014-06-15 MED ORDER — CLINDAMYCIN PHOSPHATE 900 MG/50ML IV SOLN
INTRAVENOUS | Status: AC
  Filled 2014-06-15: qty 50

## 2014-06-15 MED ORDER — ACETAMINOPHEN 10 MG/ML IV SOLN
1000.0000 mg | Freq: Once | INTRAVENOUS | Status: AC
  Administered 2014-06-15: 1000 mg via INTRAVENOUS
  Filled 2014-06-15: qty 100

## 2014-06-15 MED ORDER — HYDROMORPHONE HCL 1 MG/ML IJ SOLN
1.0000 mg | INTRAMUSCULAR | Status: DC | PRN
  Administered 2014-06-15 – 2014-06-16 (×4): 1 mg via INTRAVENOUS
  Administered 2014-06-16: 2 mg via INTRAVENOUS
  Administered 2014-06-16: 1 mg via INTRAVENOUS
  Administered 2014-06-16: 2 mg via INTRAVENOUS
  Administered 2014-06-16: 1 mg via INTRAVENOUS
  Administered 2014-06-16: 2 mg via INTRAVENOUS
  Administered 2014-06-17: 1 mg via INTRAVENOUS
  Administered 2014-06-17: 2 mg via INTRAVENOUS
  Filled 2014-06-15 (×2): qty 2
  Filled 2014-06-15: qty 1
  Filled 2014-06-15 (×2): qty 2
  Filled 2014-06-15 (×6): qty 1

## 2014-06-15 MED ORDER — ACETAMINOPHEN 325 MG PO TABS
650.0000 mg | ORAL_TABLET | Freq: Four times a day (QID) | ORAL | Status: DC | PRN
  Administered 2014-06-15 – 2014-06-17 (×3): 650 mg via ORAL
  Filled 2014-06-15 (×4): qty 2

## 2014-06-15 MED ORDER — CLINDAMYCIN PHOSPHATE 900 MG/50ML IV SOLN
900.0000 mg | INTRAVENOUS | Status: AC
  Administered 2014-06-15: 900 mg via INTRAVENOUS

## 2014-06-15 MED ORDER — EPHEDRINE SULFATE 50 MG/ML IJ SOLN
INTRAMUSCULAR | Status: AC
  Filled 2014-06-15: qty 1

## 2014-06-15 MED ORDER — LACTATED RINGERS IV SOLN
INTRAVENOUS | Status: DC | PRN
  Administered 2014-06-15 (×3): via INTRAVENOUS

## 2014-06-15 MED ORDER — METHOCARBAMOL 500 MG PO TABS
500.0000 mg | ORAL_TABLET | Freq: Four times a day (QID) | ORAL | Status: DC | PRN
  Administered 2014-06-15 – 2014-06-18 (×7): 500 mg via ORAL
  Filled 2014-06-15 (×7): qty 1

## 2014-06-15 MED ORDER — ACETAMINOPHEN 650 MG RE SUPP: 650.0000 mg | Freq: Four times a day (QID) | RECTAL | Status: DC | PRN

## 2014-06-15 MED ORDER — MIDAZOLAM HCL 2 MG/2ML IJ SOLN
INTRAMUSCULAR | Status: AC
  Filled 2014-06-15: qty 2

## 2014-06-15 MED ORDER — PROPOFOL 10 MG/ML IV BOLUS
INTRAVENOUS | Status: DC | PRN
  Administered 2014-06-15: 10 mg via INTRAVENOUS
  Administered 2014-06-15: 20 mg via INTRAVENOUS
  Administered 2014-06-15: 10 mg via INTRAVENOUS
  Administered 2014-06-15: 20 mg via INTRAVENOUS

## 2014-06-15 MED ORDER — METOCLOPRAMIDE HCL 5 MG/ML IJ SOLN
5.0000 mg | Freq: Three times a day (TID) | INTRAMUSCULAR | Status: DC | PRN
Start: 2014-06-15 — End: 2014-06-18

## 2014-06-15 MED ORDER — ZOLPIDEM TARTRATE 5 MG PO TABS
5.0000 mg | ORAL_TABLET | Freq: Every evening | ORAL | Status: DC | PRN
  Administered 2014-06-15: 5 mg via ORAL
  Filled 2014-06-15: qty 1

## 2014-06-15 MED ORDER — METHOCARBAMOL 1000 MG/10ML IJ SOLN
500.0000 mg | Freq: Four times a day (QID) | INTRAVENOUS | Status: DC | PRN
  Administered 2014-06-15: 500 mg via INTRAVENOUS
  Filled 2014-06-15 (×2): qty 5

## 2014-06-15 MED ORDER — ALUM & MAG HYDROXIDE-SIMETH 200-200-20 MG/5ML PO SUSP: 30.0000 mL | ORAL | Status: DC | PRN

## 2014-06-15 MED ORDER — PIOGLITAZONE HCL 15 MG PO TABS
15.0000 mg | ORAL_TABLET | Freq: Every day | ORAL | Status: DC
  Administered 2014-06-15 – 2014-06-18 (×4): 15 mg via ORAL
  Filled 2014-06-15 (×4): qty 1

## 2014-06-15 MED ORDER — METOCLOPRAMIDE HCL 10 MG PO TABS
5.0000 mg | ORAL_TABLET | Freq: Three times a day (TID) | ORAL | Status: DC | PRN
Start: 2014-06-15 — End: 2014-06-18

## 2014-06-15 MED ORDER — ATROPINE SULFATE 0.4 MG/ML IJ SOLN
INTRAMUSCULAR | Status: AC
  Filled 2014-06-15: qty 1

## 2014-06-15 MED ORDER — ONDANSETRON HCL 4 MG/2ML IJ SOLN
INTRAMUSCULAR | Status: DC | PRN
  Administered 2014-06-15: 4 mg via INTRAVENOUS

## 2014-06-15 MED ORDER — BUPIVACAINE IN DEXTROSE 0.75-8.25 % IT SOLN
INTRATHECAL | Status: DC | PRN
  Administered 2014-06-15: 2 mL via INTRATHECAL

## 2014-06-15 MED ORDER — ONDANSETRON HCL 4 MG/2ML IJ SOLN: 4.0000 mg | Freq: Four times a day (QID) | INTRAMUSCULAR | Status: DC | PRN

## 2014-06-15 MED ORDER — ONDANSETRON HCL 4 MG PO TABS: 4.0000 mg | ORAL_TABLET | Freq: Four times a day (QID) | ORAL | Status: DC | PRN

## 2014-06-15 MED ORDER — TRANEXAMIC ACID 1000 MG/10ML IV SOLN
1000.0000 mg | INTRAVENOUS | Status: AC
  Administered 2014-06-15: 1000 mg via INTRAVENOUS
  Filled 2014-06-15: qty 10

## 2014-06-15 MED ORDER — DIPHENHYDRAMINE HCL 12.5 MG/5ML PO ELIX: 12.5000 mg | ORAL_SOLUTION | ORAL | Status: DC | PRN

## 2014-06-15 MED ORDER — FENTANYL CITRATE (PF) 100 MCG/2ML IJ SOLN
INTRAMUSCULAR | Status: AC
  Filled 2014-06-15: qty 2

## 2014-06-15 SURGICAL SUPPLY — 62 items
APL SKNCLS STERI-STRIP NONHPOA (GAUZE/BANDAGES/DRESSINGS) ×1
BAG SPEC THK2 15X12 ZIP CLS (MISCELLANEOUS)
BAG ZIPLOCK 12X15 (MISCELLANEOUS) IMPLANT
BANDAGE ELASTIC 6 VELCRO ST LF (GAUZE/BANDAGES/DRESSINGS) ×3 IMPLANT
BANDAGE ESMARK 6X9 LF (GAUZE/BANDAGES/DRESSINGS) ×1 IMPLANT
BENZOIN TINCTURE PRP APPL 2/3 (GAUZE/BANDAGES/DRESSINGS) ×2 IMPLANT
BLADE SAG 13.0X1.37X90 (BLADE) IMPLANT
BLADE SAG 18X100X1.27 (BLADE) ×2 IMPLANT
BNDG CMPR 9X6 STRL LF SNTH (GAUZE/BANDAGES/DRESSINGS) ×1
BNDG ESMARK 6X9 LF (GAUZE/BANDAGES/DRESSINGS) ×3
BOWL SMART MIX CTS (DISPOSABLE) ×3 IMPLANT
CAPT KNEE TOTAL 3 ×2 IMPLANT
CEMENT BONE 1-PACK (Cement) ×6 IMPLANT
CLOSURE WOUND 1/2 X4 (GAUZE/BANDAGES/DRESSINGS) ×1
CUFF TOURN SGL QUICK 34 (TOURNIQUET CUFF) ×3
CUFF TRNQT CYL 34X4X40X1 (TOURNIQUET CUFF) ×1 IMPLANT
DRAPE EXTREMITY T 121X128X90 (DRAPE) ×3 IMPLANT
DRAPE POUCH INSTRU U-SHP 10X18 (DRAPES) ×3 IMPLANT
DRAPE SHEET LG 3/4 BI-LAMINATE (DRAPES) IMPLANT
DRAPE U-SHAPE 47X51 STRL (DRAPES) ×3 IMPLANT
DRSG AQUACEL AG ADV 3.5X10 (GAUZE/BANDAGES/DRESSINGS) ×1 IMPLANT
DRSG PAD ABDOMINAL 8X10 ST (GAUZE/BANDAGES/DRESSINGS) ×5 IMPLANT
DURAPREP 26ML APPLICATOR (WOUND CARE) ×3 IMPLANT
ELECT REM PT RETURN 9FT ADLT (ELECTROSURGICAL) ×3
ELECTRODE REM PT RTRN 9FT ADLT (ELECTROSURGICAL) ×1 IMPLANT
FACESHIELD WRAPAROUND (MASK) ×18 IMPLANT
FACESHIELD WRAPAROUND OR TEAM (MASK) ×5 IMPLANT
GAUZE SPONGE 4X4 12PLY STRL (GAUZE/BANDAGES/DRESSINGS) ×3 IMPLANT
GAUZE XEROFORM 1X8 LF (GAUZE/BANDAGES/DRESSINGS) ×2 IMPLANT
GLOVE BIO SURGEON STRL SZ7.5 (GLOVE) ×3 IMPLANT
GLOVE BIOGEL PI IND STRL 8 (GLOVE) ×2 IMPLANT
GLOVE BIOGEL PI INDICATOR 8 (GLOVE) ×4
GLOVE ECLIPSE 8.0 STRL XLNG CF (GLOVE) ×3 IMPLANT
GOWN STRL REUS W/TWL XL LVL3 (GOWN DISPOSABLE) ×12 IMPLANT
HANDPIECE INTERPULSE COAX TIP (DISPOSABLE) ×3
IMMOBILIZER KNEE 20 (SOFTGOODS) ×5 IMPLANT
IMMOBILIZER KNEE 20 THIGH 36 (SOFTGOODS) ×1 IMPLANT
KIT BASIN OR (CUSTOM PROCEDURE TRAY) ×3 IMPLANT
NS IRRIG 1000ML POUR BTL (IV SOLUTION) ×3 IMPLANT
PACK TOTAL JOINT (CUSTOM PROCEDURE TRAY) ×3 IMPLANT
PADDING CAST COTTON 6X4 STRL (CAST SUPPLIES) ×4 IMPLANT
PEN SKIN MARKING BROAD (MISCELLANEOUS) ×3 IMPLANT
POSITIONER SURGICAL ARM (MISCELLANEOUS) ×3 IMPLANT
SET HNDPC FAN SPRY TIP SCT (DISPOSABLE) ×1 IMPLANT
SET PAD KNEE POSITIONER (MISCELLANEOUS) ×3 IMPLANT
STAPLER VISISTAT 35W (STAPLE) IMPLANT
STRIP CLOSURE SKIN 1/2X4 (GAUZE/BANDAGES/DRESSINGS) ×1 IMPLANT
SUCTION FRAZIER 12FR DISP (SUCTIONS) ×3 IMPLANT
SUT MNCRL AB 4-0 PS2 18 (SUTURE) ×2 IMPLANT
SUT VIC AB 0 CT1 27 (SUTURE) ×6
SUT VIC AB 0 CT1 27XBRD ANTBC (SUTURE) ×1 IMPLANT
SUT VIC AB 1 CT1 27 (SUTURE) ×6
SUT VIC AB 1 CT1 27XBRD ANTBC (SUTURE) ×2 IMPLANT
SUT VIC AB 1 CT1 36 (SUTURE) ×2 IMPLANT
SUT VIC AB 2-0 CT1 27 (SUTURE) ×9
SUT VIC AB 2-0 CT1 TAPERPNT 27 (SUTURE) ×2 IMPLANT
TOWEL OR 17X26 10 PK STRL BLUE (TOWEL DISPOSABLE) ×3 IMPLANT
TOWEL OR NON WOVEN STRL DISP B (DISPOSABLE) ×2 IMPLANT
TRAY FOLEY W/METER SILVER 14FR (SET/KITS/TRAYS/PACK) ×3 IMPLANT
WATER STERILE IRR 1500ML POUR (IV SOLUTION) ×3 IMPLANT
WRAP KNEE MAXI GEL POST OP (GAUZE/BANDAGES/DRESSINGS) ×3 IMPLANT
YANKAUER SUCT BULB TIP 10FT TU (MISCELLANEOUS) ×3 IMPLANT

## 2014-06-15 NOTE — Anesthesia Procedure Notes (Signed)
Spinal Patient location during procedure: OR Start time: 06/15/2014 7:27 AM End time: 06/15/2014 7:31 AM Staffing Anesthesiologist: Franne Grip Resident/CRNA: Darlys Gales R Performed by: resident/CRNA  Preanesthetic Checklist Completed: patient identified, site marked, surgical consent, pre-op evaluation, timeout performed, IV checked, risks and benefits discussed and monitors and equipment checked Spinal Block Patient position: sitting Prep: Betadine Patient monitoring: heart rate, continuous pulse ox and blood pressure Location: L3-4 Injection technique: single-shot Needle Needle type: Spinocan  Needle gauge: 24 G Needle length: 10 cm Needle insertion depth: 8 cm Assessment Sensory level: T6 Additional Notes Expiration date of kit checked and confirmed. Patient tolerated procedure well, without complications.

## 2014-06-15 NOTE — Brief Op Note (Signed)
06/15/2014  9:08 AM  PATIENT:  David Wall  59 y.o. male  PRE-OPERATIVE DIAGNOSIS:  primary osteoarthritis right knee  POST-OPERATIVE DIAGNOSIS:  primary osteoarthritis right knee  PROCEDURE:  Procedure(s): RIGHT TOTAL KNEE ARTHROPLASTY (Right)  SURGEON:  Surgeon(s) and Role:    * Mcarthur Rossetti, MD - Primary  PHYSICIAN ASSISTANT: Benita Stabile, PA-C  ANESTHESIA:   spinal  EBL:  Total I/O In: 2000 [I.V.:2000] Out: 250 [Urine:200; Blood:50]  BLOOD ADMINISTERED:none  DRAINS: none   LOCAL MEDICATIONS USED:  NONE  SPECIMEN:  No Specimen  DISPOSITION OF SPECIMEN:  N/A  COUNTS:  YES  TOURNIQUET:   Total Tourniquet Time Documented: Thigh (Right) - 57 minutes Total: Thigh (Right) - 57 minutes   DICTATION: .Other Dictation: Dictation Number [id number]  PLAN OF CARE: Admit to inpatient   PATIENT DISPOSITION:  PACU - hemodynamically stable.   Delay start of Pharmacological VTE agent (>24hrs) due to surgical blood loss or risk of bleeding: no

## 2014-06-15 NOTE — Care Management Note (Signed)
Case Management Note  Patient Details  Name: JAESON MOLSTAD MRN: [id number] Date of Birth: 1955-09-15  Subjective/Objective:          Admitted s/p RIGHT TOTAL KNEE ARTHROPLASTY           Action/Plan: Discharge planning, spoke with patient and spouse at bedside. Patient is agreeable to [facility] with [facility], does not have any DME, awaiting evaluations and recommendations from PT/OT.  Expected Discharge Date:                  Expected Discharge Plan:  [place]  In-House Referral:  NA  Discharge planning Services  CM Consult  Post Acute Care Choice:    Choice offered to:  Patient  DME Arranged:    DME Agency:     HH Arranged:  PT HH Agency:  [facility]  Status of Service:  In process, will continue to follow  Medicare Important Message Given:    Date Medicare IM Given:    Medicare IM give by:    Date Additional Medicare IM Given:    Additional Medicare Important Message give by:     If discussed at [place] of Stay Meetings, dates discussed:    Additional Comments:  Guadalupe Maple, RN 06/15/2014, 11:33 AM

## 2014-06-15 NOTE — Progress Notes (Signed)
Orthopedic Tech Progress Note Patient Details:  David Wall [birth year] [id number]  CPM Right Knee CPM Right Knee: On Right Knee Flexion (Degrees): 60 Right Knee Extension (Degrees): 0   Cammer, Theodoro Parma 06/15/2014, 1:27 PM

## 2014-06-15 NOTE — Transfer of Care (Signed)
Immediate Anesthesia Transfer of Care Note  Patient: David Wall  Procedure(s) Performed: Procedure(s): RIGHT TOTAL KNEE ARTHROPLASTY (Right)  Patient Location: PACU  Anesthesia Type:Spinal  Level of Consciousness:  sedated, patient cooperative and responds to stimulation  Airway & Oxygen Therapy:Patient Spontanous Breathing and Patient connected to face mask oxgen  Post-op Assessment:  Report given to PACU RN and Post -op Vital signs reviewed and stable  Post vital signs:  Reviewed and stable  Last Vitals:  Filed Vitals:   06/15/14 0535  BP: 118/67  Pulse: 77  Temp: 36.9 C  Resp: 18    Complications: No apparent anesthesia complications, V42

## 2014-06-15 NOTE — Progress Notes (Signed)
Orthopedic Tech Progress Note Patient Details:  David Wall 10/23/1955 [id number]  CPM Right Knee CPM Right Knee: On Right Knee Flexion (Degrees): 60 Right Knee Extension (Degrees): 0   Cammer, Theodoro Parma 06/15/2014, 1:27 PM

## 2014-06-15 NOTE — Anesthesia Postprocedure Evaluation (Signed)
  Anesthesia Post-op Note  Patient: David Wall  Procedure(s) Performed: Procedure(s) (LRB): RIGHT TOTAL KNEE ARTHROPLASTY (Right)  Patient Location: PACU  Anesthesia Type: Spinal  Level of Consciousness: awake and alert   Airway and Oxygen Therapy: Patient Spontanous Breathing  Post-op Pain: mild  Post-op Assessment: Post-op Vital signs reviewed, Patient's Cardiovascular Status Stable, Respiratory Function Stable, Patent Airway and No signs of Nausea or vomiting  Last Vitals:  Filed Vitals:   06/15/14 1141  BP: 112/67  Pulse: 55  Temp: 36.6 C  Resp: 16    Post-op Vital Signs: stable   Complications: No apparent anesthesia complications.

## 2014-06-15 NOTE — H&P (Signed)
TOTAL KNEE ADMISSION H&P  Patient is being admitted for right total knee arthroplasty.  Subjective:  Chief Complaint:right knee pain.  HPI: David Wall, 59 y.o. male, has a history of pain and functional disability in the right knee due to arthritis and has failed non-surgical conservative treatments for greater than 12 weeks to includeNSAID's and/or analgesics, corticosteriod injections and activity modification.  Onset of symptoms was gradual, starting 2 years ago with rapidlly worsening course since that time. The patient noted no past surgery on the right knee(s).  Patient currently rates pain in the right knee(s) at 10 out of 10 with activity. Patient has night pain, worsening of pain with activity and weight bearing, pain that interferes with activities of daily living, pain with passive range of motion, crepitus and joint swelling.  Patient has evidence of subchondral sclerosis, periarticular osteophytes and joint space narrowing by imaging studies. There is no active infection.  Patient Active Problem List   Diagnosis Date Noted  . Osteoarthritis of right knee 06/15/2014  . Sleep disorder 04/25/2014  . Inguinal hernia 12/21/2013  . Diabetes 10/11/2013  . Routine general medical examination at a health care facility 10/10/2013  . Screening for prostate cancer 10/10/2013  . GOUT 04/26/2008  . RECTAL BLEEDING 04/26/2008  . CHEST PAIN UNSPECIFIED 04/25/2007  . HYPERCHOLESTEROLEMIA 01/26/2007  . UNSPECIFIED CHRONIC ISCHEMIC HEART DISEASE 01/26/2007  . CERVICAL RADICULOPATHY, RIGHT 01/26/2007  . INSOMNIA 01/26/2007   Past Medical History  Diagnosis Date  . Gout   . Asthma     hx of in childhood  . Diabetes mellitus without complication   . GERD (gastroesophageal reflux disease)   . Arthritis   . H/O urinary frequency     as noted in H&P per Dr Loanne Drilling 04/25/2014     Past Surgical History  Procedure Laterality Date  . Eye surgery      laser surgery bilat   . Upper gi  endoscopy      Prescriptions prior to admission  Medication Sig Dispense Refill Last Dose  . HYDROcodone-acetaminophen (NORCO/VICODIN) 5-325 MG per tablet Take 1-2 tablets by mouth every 12 (twelve) hours as needed for moderate pain.   0 06/13/2014  . pioglitazone (ACTOS) 15 MG tablet Take 1 tablet (15 mg total) by mouth daily. 90 tablet 3 06/13/2014  . tamsulosin (FLOMAX) 0.4 MG CAPS capsule Take 1 capsule (0.4 mg total) by mouth daily. 30 capsule 3 06/13/2014  . acetaminophen (TYLENOL) 500 MG tablet Take 1,000 mg by mouth every 6 (six) hours as needed.   06/12/2014  . ibuprofen (ADVIL,MOTRIN) 200 MG tablet Take 600 mg by mouth every 6 (six) hours as needed.   06/10/2014  . naproxen (NAPROSYN) 500 MG tablet Take 1 tablet (500 mg total) by mouth 2 (two) times daily. (Patient not taking: Reported on 06/12/2014) 15 tablet 0    Allergies  Allergen Reactions  . Amoxicillin Shortness Of Breath  . Aspirin Shortness Of Breath  . Penicillins Shortness Of Breath  . Allopurinol Rash    History  Substance Use Topics  . Smoking status: Never Smoker   . Smokeless tobacco: Never Used  . Alcohol Use: Yes     Comment: occ    History reviewed. No pertinent family history.   Review of Systems  Musculoskeletal: Positive for joint pain.  All other systems reviewed and are negative.   Objective:  Physical Exam  Constitutional: He is oriented to person, place, and time. He appears well-developed and well-nourished.  HENT:  Head:  Normocephalic and atraumatic.  Eyes: EOM are normal. Pupils are equal, round, and reactive to light.  Neck: Normal range of motion. Neck supple.  Cardiovascular: Normal rate and regular rhythm.   Respiratory: Effort normal and breath sounds normal.  GI: Soft. Bowel sounds are normal.  Musculoskeletal:       Right knee: He exhibits decreased range of motion, swelling and bony tenderness. Tenderness found. Medial joint line and lateral joint line tenderness noted.   Neurological: He is alert and oriented to person, place, and time.  Skin: Skin is warm and dry.  Psychiatric: He has a normal mood and affect.    Vital signs in last 24 hours: Temp:  [98.5 F (36.9 C)] 98.5 F (36.9 C) (05/27 0535) Pulse Rate:  [77] 77 (05/27 0535) Resp:  [18] 18 (05/27 0535) BP: (118)/(67) 118/67 mmHg (05/27 0535) SpO2:  [99 %] 99 % (05/27 0535) Weight:  [112.492 kg (248 lb)] 112.492 kg (248 lb) (05/27 0537)  Labs:   Estimated body mass index is 37.72 kg/(m^2) as calculated from the following:   Height as of this encounter: 5\' 8"  (1.727 m).   Weight as of this encounter: 112.492 kg (248 lb).   Imaging Review Plain radiographs demonstrate moderate degenerative joint disease of the right knee(s). His MRI shows full-thickness cartilage loss throughout his knee. The overall alignment ismild varus. The bone quality appears to be excellent for age and reported activity level.  Assessment/Plan:  End stage arthritis, right knee   The patient history, physical examination, clinical judgment of the provider and imaging studies are consistent with end stage degenerative joint disease of the right knee(s) and total knee arthroplasty is deemed medically necessary. The treatment options including medical management, injection therapy arthroscopy and arthroplasty were discussed at length. The risks and benefits of total knee arthroplasty were presented and reviewed. The risks due to aseptic loosening, infection, stiffness, patella tracking problems, thromboembolic complications and other imponderables were discussed. The patient acknowledged the explanation, agreed to proceed with the plan and consent was signed. Patient is being admitted for inpatient treatment for surgery, pain control, PT, OT, prophylactic antibiotics, VTE prophylaxis, progressive ambulation and ADL's and discharge planning. The patient is planning to be discharged home with home health services

## 2014-06-15 NOTE — Evaluation (Signed)
Physical Therapy Evaluation Patient Details Name: David Wall MRN: [id number] DOB: 1955-05-27 Today's Date: 06/15/2014   History of Present Illness  R TKR  Clinical Impression  Pt s/p R TKR presents with decreased R LE strength/ROM and post op pain limiting functional mobility.  Pt should progress to dc home with family assist and HHPT follow up.    Follow Up Recommendations Home health PT    Equipment Recommendations  Rolling walker with 5" wheels    Recommendations for Other Services OT consult     Precautions / Restrictions Precautions Precautions: Knee;Fall Required Braces or Orthoses: Knee Immobilizer - Right Knee Immobilizer - Right: Discontinue once straight leg raise with < 10 degree lag Restrictions Weight Bearing Restrictions: No Other Position/Activity Restrictions: WBAT      Mobility  Bed Mobility Overal bed mobility: Needs Assistance Bed Mobility: Supine to Sit     Supine to sit: Min assist     General bed mobility comments: Cues for sequence and use of L LE to self assist  Transfers Overall transfer level: Needs assistance Equipment used: Rolling walker (2 wheeled) Transfers: Sit to/from Stand Sit to Stand: Min assist         General transfer comment: cues for LE management and use of UEs to self assist  Ambulation/Gait Ambulation/Gait assistance: Min assist Ambulation Distance (Feet): 50 Feet Assistive device: Rolling walker (2 wheeled) Gait Pattern/deviations: Step-to pattern;Decreased step length - right;Decreased step length - left;Shuffle;Trunk flexed     General Gait Details: cues for posture, sequence and position from [employer]            Wheelchair Mobility    Modified Rankin (Stroke Patients Only)       Balance                                             Pertinent Vitals/Pain Pain Assessment: 0-10 Pain Score: 4  Pain Location: R knee Pain Descriptors / Indicators: Aching;Sore Pain  Intervention(s): Limited activity within patient's tolerance;Monitored during session;Premedicated before session;Ice applied    Home Living Family/patient expects to be discharged to:: Private residence Living Arrangements: Spouse/significant other Available Help at Discharge: Family Type of Home: House Home Access: Stairs to enter Entrance Stairs-Rails: Psychiatric nurse of Steps: 6 Home Layout: One level Home Equipment: None      Prior Function Level of Independence: Independent               Hand Dominance   Dominant Hand: Right    Extremity/Trunk Assessment   Upper Extremity Assessment: Overall WFL for tasks assessed           Lower Extremity Assessment: RLE deficits/detail      Cervical / Trunk Assessment: Normal  Communication   Communication: No difficulties  Cognition Arousal/Alertness: Awake/alert Behavior During Therapy: WFL for tasks assessed/performed Overall Cognitive Status: Within Functional Limits for tasks assessed                      General Comments      Exercises        Assessment/Plan    PT Assessment Patient needs continued PT services  PT Diagnosis Difficulty walking   PT Problem List Decreased strength;Decreased range of motion;Decreased activity tolerance;Decreased mobility;Decreased knowledge of use of DME;Obesity;Pain  PT Treatment Interventions DME instruction;Gait training;Stair training;Functional mobility training;Therapeutic activities;Therapeutic exercise;Patient/family education  PT Goals (Current goals can be found in the Care Plan section) Acute Rehab PT Goals Patient Stated Goal: Resume previous lifestyle with decreased pain PT Goal Formulation: With patient Time For Goal Achievement: 06/22/14 Potential to Achieve Goals: Good    Frequency 7X/week   Barriers to discharge        Co-evaluation               End of Session Equipment Utilized During Treatment: Gait belt;Right  knee immobilizer Activity Tolerance: Patient tolerated treatment well Patient left: in chair;with call bell/phone within reach;with family/visitor present;with nursing/sitter in room Nurse Communication: Mobility status         Time: 1540-1610 PT Time Calculation (min) (ACUTE ONLY): 30 min   Charges:   PT Evaluation $Initial PT Evaluation Tier I: 1 Procedure PT Treatments $Gait Training: 8-22 mins   PT G Codes:        , 2014-06-27, 5:27 PM

## 2014-06-15 NOTE — Plan of Care (Signed)
Problem: Consults Goal: Diagnosis- Total Joint Replacement Right total knee replacement

## 2014-06-16 LAB — CBC
HCT: 32.7 % — ABNORMAL LOW (ref 39.0–52.0)
Hemoglobin: 10.4 g/dL — ABNORMAL LOW (ref 13.0–17.0)
MCH: 27.9 pg (ref 26.0–34.0)
MCHC: 31.8 g/dL (ref 30.0–36.0)
MCV: 87.7 fL (ref 78.0–100.0)
PLATELETS: 278 10*3/uL (ref 150–400)
RBC: 3.73 MIL/uL — ABNORMAL LOW (ref 4.22–5.81)
RDW: 12.7 % (ref 11.5–15.5)
WBC: 9.6 10*3/uL (ref 4.0–10.5)

## 2014-06-16 LAB — BASIC METABOLIC PANEL
Anion gap: 9 (ref 5–15)
BUN: 12 mg/dL (ref 6–20)
CO2: 24 mmol/L (ref 22–32)
Calcium: 8.8 mg/dL — ABNORMAL LOW (ref 8.9–10.3)
Chloride: 97 mmol/L — ABNORMAL LOW (ref 101–111)
Creatinine, Ser: 0.8 mg/dL (ref 0.61–1.24)
GFR calc Af Amer: >60 mL/min (ref >60)
GFR calc non Af Amer: >60 mL/min (ref >60)
Glucose, Bld: 114 mg/dL — ABNORMAL HIGH (ref 65–99)
Potassium: 4 mmol/L (ref 3.5–5.1)
SODIUM: 130 mmol/L — AB (ref 135–145)

## 2014-06-16 MED ORDER — POLYETHYLENE GLYCOL 3350 17 G PO PACK
17.0000 g | PACK | Freq: Every day | ORAL | Status: DC | PRN
  Administered 2014-06-16 – 2014-06-17 (×2): 17 g via ORAL
  Filled 2014-06-16: qty 1

## 2014-06-16 NOTE — Progress Notes (Signed)
Physical Therapy Treatment Patient Details Name: David Wall MRN: [id number] DOB: Nov 19, 1955 Today's Date: 06/16/2014    History of Present Illness R TKR    PT Comments    Pt cooperative and motivated but ltd this am by increased R shoulder pain with activity.  Follow Up Recommendations  Home health PT     Equipment Recommendations  Rolling walker with 5" wheels    Recommendations for Other Services OT consult     Precautions / Restrictions Precautions Precautions: Knee;Fall Required Braces or Orthoses: Knee Immobilizer - Right Knee Immobilizer - Right: Discontinue once straight leg raise with < 10 degree lag Restrictions Weight Bearing Restrictions: No Other Position/Activity Restrictions: WBAT    Mobility  Bed Mobility Overal bed mobility: Needs Assistance Bed Mobility: Supine to Sit     Supine to sit: Min assist     General bed mobility comments: Cues for sequence and use of L LE to self assist  Transfers Overall transfer level: Needs assistance Equipment used: Rolling walker (2 wheeled) Transfers: Sit to/from Stand Sit to Stand: Min assist         General transfer comment: cues for LE management and use of UEs to self assist  Ambulation/Gait Ambulation/Gait assistance: Min assist Ambulation Distance (Feet): 26 Feet Assistive device: Rolling walker (2 wheeled) Gait Pattern/deviations: Step-to pattern;Decreased step length - right;Decreased step length - left;Shuffle;Trunk flexed     General Gait Details: cues for posture, sequence and position from [employer]            Wheelchair Mobility    Modified Rankin (Stroke Patients Only)       Balance                                    Cognition Arousal/Alertness: Awake/alert Behavior During Therapy: WFL for tasks assessed/performed Overall Cognitive Status: Within Functional Limits for tasks assessed                      Exercises      General Comments         Pertinent Vitals/Pain Pain Assessment: 0-10 Pain Score: 6  Pain Location: 6/10 shoulder pain with activity; 4/10 knee pain Pain Descriptors / Indicators: Aching;Sore;Numbness Pain Intervention(s): Limited activity within patient's tolerance;Monitored during session;Premedicated before session;Ice applied;RN gave pain meds during session    Home Living                      Prior Function            PT Goals (current goals can now be found in the care plan section) Acute Rehab PT Goals Patient Stated Goal: Resume previous lifestyle with decreased pain PT Goal Formulation: With patient Time For Goal Achievement: 06/22/14 Potential to Achieve Goals: Good Progress towards PT goals: Progressing toward goals    Frequency  7X/week    PT Plan Current plan remains appropriate    Co-evaluation             End of Session Equipment Utilized During Treatment: Gait belt;Right knee immobilizer Activity Tolerance: Patient limited by pain Patient left: in chair;with call bell/phone within reach;with family/visitor present;with nursing/sitter in room     Time: 1040-1057 PT Time Calculation (min) (ACUTE ONLY): 17 min  Charges:  $Gait Training: 8-22 mins  G Codes:      , July 05, 2014, 12:44 PM

## 2014-06-16 NOTE — Progress Notes (Signed)
Physical Therapy Treatment Patient Details Name: David Wall MRN: [id number] DOB: September 09, 1955 Today's Date: 06/16/2014    History of Present Illness R TKR    PT Comments    Therex performed. OOB deferred, pt just finished with OT and with increased R shoulder pain.  Follow Up Recommendations  Home health PT     Equipment Recommendations  Rolling walker with 5" wheels    Recommendations for Other Services OT consult     Precautions / Restrictions Precautions Precautions: Knee;Fall Required Braces or Orthoses: Knee Immobilizer - Right Knee Immobilizer - Right: Discontinue once straight leg raise with < 10 degree lag Restrictions Weight Bearing Restrictions: No Other Position/Activity Restrictions: WBAT    Mobility  Bed Mobility Overal bed mobility: Needs Assistance Bed Mobility: Supine to Sit     Supine to sit: Min assist     General bed mobility comments: Cues for sequence and use of L LE to self assist  Transfers Overall transfer level: Needs assistance Equipment used: Rolling walker (2 wheeled) Transfers: Sit to/from Stand Sit to Stand: Min assist         General transfer comment: cues for LE management and use of UEs to self assist  Ambulation/Gait Ambulation/Gait assistance: Min assist Ambulation Distance (Feet): 26 Feet Assistive device: Rolling walker (2 wheeled) Gait Pattern/deviations: Step-to pattern;Decreased step length - right;Decreased step length - left;Shuffle;Trunk flexed     General Gait Details: cues for posture, sequence and position from [employer]            Wheelchair Mobility    Modified Rankin (Stroke Patients Only)       Balance                                    Cognition Arousal/Alertness: Awake/alert Behavior During Therapy: WFL for tasks assessed/performed Overall Cognitive Status: Within Functional Limits for tasks assessed                      Exercises Total Joint  Exercises Ankle Circles/Pumps: AROM;Both;15 reps;Supine Quad Sets: AROM;Both;10 reps;Supine Heel Slides: AAROM;Right;15 reps;Supine Straight Leg Raises: AAROM;Right;10 reps;Supine Goniometric ROM: AAROM R knee -10 - 40    General Comments        Pertinent Vitals/Pain Pain Assessment: 0-10 Pain Score: 5  Pain Location: R knee Pain Descriptors / Indicators: Aching;Burning Pain Intervention(s): Limited activity within patient's tolerance;Premedicated before session;Monitored during session;Ice applied    Home Living                      Prior Function            PT Goals (current goals can now be found in the care plan section) Acute Rehab PT Goals Patient Stated Goal: Resume previous lifestyle with decreased pain PT Goal Formulation: With patient Time For Goal Achievement: 06/22/14 Potential to Achieve Goals: Good Progress towards PT goals: Progressing toward goals    Frequency  7X/week    PT Plan Current plan remains appropriate    Co-evaluation             End of Session Equipment Utilized During Treatment: Gait belt;Right knee immobilizer Activity Tolerance: Patient tolerated treatment well Patient left: in bed;with call bell/phone within reach     Time: 1150-1205 PT Time Calculation (min) (ACUTE ONLY): 15 min  Charges:  $Gait Training: 8-22 mins $Therapeutic Exercise: 8-22 mins  G Codes:      , 15-Jul-2014, 12:49 PM

## 2014-06-16 NOTE — Progress Notes (Signed)
Physical Therapy Treatment Patient Details Name: David Wall MRN: [id number] DOB: 1955-12-21 Today's Date: 06/16/2014    History of Present Illness R TKR    PT Comments    Noted improvement in activity tolerance with decreased c/o R shoulder pain.  Follow Up Recommendations  Home health PT     Equipment Recommendations  Rolling walker with 5" wheels    Recommendations for Other Services OT consult     Precautions / Restrictions Precautions Precautions: Knee;Fall Required Braces or Orthoses: Knee Immobilizer - Right Knee Immobilizer - Right: Discontinue once straight leg raise with < 10 degree lag Restrictions Weight Bearing Restrictions: No Other Position/Activity Restrictions: WBAT    Mobility  Bed Mobility Overal bed mobility: Needs Assistance Bed Mobility: Supine to Sit;Sit to Supine     Supine to sit: Min assist Sit to supine: Min assist   General bed mobility comments: for R LE back onto bed.   Transfers Overall transfer level: Needs assistance Equipment used: Rolling walker (2 wheeled) Transfers: Sit to/from Stand Sit to Stand: Min assist         General transfer comment: cues for hand placement and LE management.  Ambulation/Gait Ambulation/Gait assistance: Min assist;Min guard Ambulation Distance (Feet): 70 Feet Assistive device: Rolling walker (2 wheeled) Gait Pattern/deviations: Step-to pattern;Decreased step length - right;Decreased step length - left;Shuffle;Trunk flexed Gait velocity: decr   General Gait Details: cues for posture, sequence and position from [employer] Mobility    Modified Rankin (Stroke Patients Only)       Balance                                    Cognition Arousal/Alertness: Awake/alert Behavior During Therapy: WFL for tasks assessed/performed Overall Cognitive Status: Within Functional Limits for tasks assessed                      Exercises Total  Joint Exercises Ankle Circles/Pumps: AROM;Both;15 reps;Supine Quad Sets: AROM;Both;10 reps;Supine Heel Slides: AAROM;Right;15 reps;Supine Straight Leg Raises: AAROM;Right;10 reps;Supine Goniometric ROM: AAROM R knee -10 - 40    General Comments        Pertinent Vitals/Pain Pain Assessment: 0-10 Pain Score: 5  Pain Location: r knee; 4/10 shoulder Pain Descriptors / Indicators: Aching;Sore;Numbness Pain Intervention(s): Limited activity within patient's tolerance;Monitored during session;Premedicated before session    Home Living Family/patient expects to be discharged to:: Private residence Living Arrangements: Spouse/significant other Available Help at Discharge: Family Type of Home: House Home Access: Stairs to enter Entrance Stairs-Rails: Right;Left Home Layout: One level Home Equipment: None      Prior Function Level of Independence: Independent          PT Goals (current goals can now be found in the care plan section) Acute Rehab PT Goals Patient Stated Goal: Resume previous lifestyle with decreased pain PT Goal Formulation: With patient Time For Goal Achievement: 06/22/14 Potential to Achieve Goals: Good Progress towards PT goals: Progressing toward goals    Frequency  7X/week    PT Plan Current plan remains appropriate    Co-evaluation             End of Session Equipment Utilized During Treatment: Gait belt;Right knee immobilizer Activity Tolerance: Patient tolerated treatment well Patient left: in bed;with call bell/phone within reach     Time: 3149-7026 PT Time Calculation (  min) (ACUTE ONLY): 31 min  Charges:  $Gait Training: 23-37 mins $Therapeutic Exercise: 8-22 mins                    G Codes:      , 06/27/14, 3:23 PM

## 2014-06-16 NOTE — Evaluation (Signed)
Occupational Therapy Evaluation Patient Details Name: David Wall MRN: [id number] DOB: 09/19/1955 Today's Date: 06/16/2014    History of Present Illness R TKR   Clinical Impression   Pt most limited by R shoulder pain with weightbearing on walker. Pt stated pain was 4-5/10 sitting at start of session but quickly increased to 8/10 with weightbearing on walker when up to bathroom. Nursing made aware. Heat pack applied once back in bed to R posterior shoulder. Will follow on acute to progress ADL independence and safety.    Follow Up Recommendations  No OT follow up;Supervision/Assistance - 24 hour    Equipment Recommendations  3 in 1 bedside comode    Recommendations for Other Services       Precautions / Restrictions Precautions Precautions: Knee;Fall Required Braces or Orthoses: Knee Immobilizer - Right Knee Immobilizer - Right: Discontinue once straight leg raise with < 10 degree lag Restrictions Weight Bearing Restrictions: No Other Position/Activity Restrictions: WBAT      Mobility Bed Mobility Overal bed mobility: Needs Assistance Bed Mobility: Sit to Supine      Sit to supine: Min assist   General bed mobility comments: for R LE back onto bed.   Transfers Overall transfer level: Needs assistance Equipment used: Rolling walker (2 wheeled) Transfers: Sit to/from Stand Sit to Stand: Min assist         General transfer comment: cues for hand placement and LE management.    Balance                                            ADL Overall ADL's : Needs assistance/impaired Eating/Feeding: Independent;Sitting   Grooming: Wash/dry hands;Set up;Sitting   Upper Body Bathing: Set up;Sitting   Lower Body Bathing: Moderate assistance;Sit to/from stand   Upper Body Dressing : Minimal assistance;Sitting   Lower Body Dressing: Maximal assistance;Sit to/from stand   Toilet Transfer: Minimal assistance;Ambulation;BSC;RW   Toileting-  Clothing Manipulation and Hygiene: Minimal assistance;Sit to/from stand         General ADL Comments: Pt most limited by R shoulder pain with weightbearing on walker, Nursing made aware. Heat applied after session and daughter massaged scapula area. Pt states pain is worse when up this session than with PT earlier. Nursing will inform MD.      Vision     Perception     Praxis      Pertinent Vitals/Pain Pain Assessment: 0-10 Pain Score: 5  Pain Location: R knee Pain Descriptors / Indicators: Aching;Burning Pain Intervention(s): Limited activity within patient's tolerance;Premedicated before session;Monitored during session;Ice applied     Hand Dominance Right   Extremity/Trunk Assessment Upper Extremity Assessment Upper Extremity Assessment: RUE deficits/detail RUE Deficits / Details: Pt able to raise R UE into full shoulder ROM but reports 8/10 pain in R shouder with weightbearing through walker. Nursing made aware.           Communication Communication Communication: No difficulties   Cognition Arousal/Alertness: Awake/alert Behavior During Therapy: WFL for tasks assessed/performed Overall Cognitive Status: Within Functional Limits for tasks assessed                     General Comments       Exercises Exercises: Total Joint     Shoulder Instructions      Home Living Family/patient expects to be discharged to:: Private residence Living Arrangements: Spouse/significant other Available  Help at Discharge: Family Type of Home: House Home Access: Stairs to enter [employer] of Steps: 6 Entrance Stairs-Rails: Right;Left Home Layout: One level     Bathroom Shower/Tub: Teacher, early years/pre: Handicapped height     Home Equipment: None          Prior Functioning/Environment Level of Independence: Independent             OT Diagnosis: Generalized weakness;Acute pain   OT Problem List: Decreased strength;Decreased  knowledge of use of DME or AE;Pain   OT Treatment/Interventions: Self-care/ADL training;Patient/family education;Therapeutic activities;DME and/or AE instruction    OT Goals(Current goals can be found in the care plan section) Acute Rehab OT Goals Patient Stated Goal: decrease pain OT Goal Formulation: With patient Time For Goal Achievement: 06/23/14 Potential to Achieve Goals: Good  OT Frequency: Min 2X/week   Barriers to D/C:            Co-evaluation              End of Session Equipment Utilized During Treatment: Gait belt;Rolling walker;Right knee immobilizer  Activity Tolerance: Patient limited by pain Patient left: in bed;with call bell/phone within reach   Time: 1126-1154 OT Time Calculation (min): 28 min Charges:  OT General Charges $OT Visit: 1 Procedure OT Evaluation $Initial OT Evaluation Tier I: 1 Procedure OT Treatments $Therapeutic Activity: 8-22 mins G-Codes:    Jules Schick  [phone] 06/16/2014, 1:40 PM

## 2014-06-16 NOTE — Progress Notes (Signed)
Patient ID: David Wall, male   DOB: February 23, 1955, 59 y.o.   MRN: [id number] Postoperative day 1 right total knee arthroplasty. Patient is comfortable sitting in a chair. Hemoglobin is stable. Anticipate discharge to home on Sunday.

## 2014-06-16 NOTE — Care Management Note (Signed)
Case Management Note  Patient Details  Name: David Wall MRN: [id number] Date of Birth: December 12, 1955  Subjective/Objective:         right total knee arthroplasty           Action/Plan:   Expected Discharge Date:    06/17/2014           Expected Discharge Plan:  [place]  In-House Referral:  NA  Discharge planning Services  CM Consult  Post Acute Care Choice:  Home Health Choice offered to:  Patient  DME Arranged:  3-N-1, Walker rolling DME Agency:  [place]:  PT [facility] Agency:  [place]  Status of Service:  Completed, signed off  Medicare Important Message Given:    Date Medicare IM Given:    Medicare IM give by:    Date Additional Medicare IM Given:    Additional Medicare Important Message give by:     If discussed at [place] of Stay Meetings, dates discussed:    Additional Comments: [facility] preoperatively arranged for [facility]. NCM spoke to pt and offered choice for [facility]. Pt agreeable to [facility] for [facility]. Ordered 3n1 and RW for home with [facility]   Erenest Rasher, RN 06/16/2014, 1:33 PM

## 2014-06-16 NOTE — Progress Notes (Signed)
Patient continues to complain of right posterior shoulder pain. Drs. Sharol Given and [place] are aware and believe this is related to position in OR. Will continue to monitor.

## 2014-06-17 LAB — CBC
HEMATOCRIT: 29.4 % — AB (ref 39.0–52.0)
Hemoglobin: 9.6 g/dL — ABNORMAL LOW (ref 13.0–17.0)
MCH: 28.1 pg (ref 26.0–34.0)
MCHC: 32.7 g/dL (ref 30.0–36.0)
MCV: 86 fL (ref 78.0–100.0)
PLATELETS: 271 10*3/uL (ref 150–400)
RBC: 3.42 MIL/uL — AB (ref 4.22–5.81)
RDW: 12.5 % (ref 11.5–15.5)
WBC: 9.6 10*3/uL (ref 4.0–10.5)

## 2014-06-17 NOTE — Progress Notes (Signed)
Physical Therapy Treatment Patient Details Name: David Wall MRN: [id number] DOB: Dec 30, 1955 Today's Date: 06/17/2014    History of Present Illness R TKR, post op R posterior shoulder pain    PT Comments    Pt continues to report 10/10 R posterior shoulder pain. Instructed pt in scapular retraction exercises and in neck stretches (lateral flexion). Stair training completed with wife present. Performed R TKA exercises with min assist.   Follow Up Recommendations  Home health PT     Equipment Recommendations  Rolling walker with 5" wheels    Recommendations for Other Services OT consult     Precautions / Restrictions Precautions Precautions: Knee;Fall Required Braces or Orthoses: Knee Immobilizer - Right Knee Immobilizer - Right: Discontinue once straight leg raise with < 10 degree lag Restrictions Weight Bearing Restrictions: No Other Position/Activity Restrictions: WBAT    Mobility  Bed Mobility Overal bed mobility: Needs Assistance Bed Mobility: Supine to Sit     Supine to sit: Supervision     General bed mobility comments: NT-up in chair  Transfers Overall transfer level: Modified independent Equipment used: Rolling walker (2 wheeled) Transfers: Sit to/from Stand Sit to Stand: Modified independent (Device/Increase time)         General transfer comment: used armrests to push up  Ambulation/Gait Ambulation/Gait assistance: Min guard Ambulation Distance (Feet): 55 Feet Assistive device: Rolling walker (2 wheeled) Gait Pattern/deviations: Step-to pattern;Decreased step length - left;Decreased step length - right;Trunk flexed Gait velocity: decr   General Gait Details: cues for posture, sequence and position from [employer] Stairs: Yes Stairs assistance: Min guard Stair Management: One rail Right;Step to pattern;Forwards;With cane Number of Stairs: 5 General stair comments: cues for sequencing, wife present for stair trianing  Wheelchair  Mobility    Modified Rankin (Stroke Patients Only)       Balance                                    Cognition Arousal/Alertness: Awake/alert Behavior During Therapy: WFL for tasks assessed/performed Overall Cognitive Status: Within Functional Limits for tasks assessed                      Exercises Total Joint Exercises Ankle Circles/Pumps: AROM;Both;15 reps;Supine Quad Sets: AROM;Both;10 reps;Supine Short Arc Quad: AAROM;Right;Supine;10 reps Heel Slides: AAROM;Right;Supine;10 reps Hip ABduction/ADduction: AAROM;Right;10 reps;Supine Straight Leg Raises: AAROM;Right;10 reps;Supine Goniometric ROM: -5-45*   Instructed pt in Scapular retraction AROM and cervical lateral flexion stretches.     General Comments        Pertinent Vitals/Pain Pain Assessment: Faces Pain Score: 3  Faces Pain Scale: Hurts even more Pain Location: 10/10 R shoulder Pain Descriptors / Indicators: Sore Pain Intervention(s): Patient requesting pain meds-RN notified;Limited activity within patient's tolerance;Monitored during session;Ice applied;Heat applied;Premedicated before session (ice to R knee, heat to R shoulder/neck)    Home Living                      Prior Function            PT Goals (current goals can now be found in the care plan section) Acute Rehab PT Goals Patient Stated Goal: Resume previous lifestyle with decreased pain, play with grandkids, 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 PT Goal Formulation: With patient Time For Goal Achievement: 06/22/14 Potential to Achieve Goals: Good Progress towards  PT goals: Progressing toward goals    Frequency  7X/week    PT Plan Current plan remains appropriate    Co-evaluation             End of Session Equipment Utilized During Treatment: Gait belt Activity Tolerance: Patient tolerated treatment well Patient left: with call bell/phone within reach;in chair;with  family/visitor present     Time: 1030-1102 PT Time Calculation (min) (ACUTE ONLY): 32 min  Charges:  $Gait Training: 8-22 mins $Therapeutic Exercise: 8-22 mins $Therapeutic Activity: 8-22 mins                    G Codes:      Philomena Doheny 06/17/2014, 11:07 AM [phone]

## 2014-06-17 NOTE — Progress Notes (Signed)
Occupational Therapy Treatment Patient Details Name: David Wall MRN: [id number] DOB: [birth year] Today's Date: 06/17/2014    History of present illness 59 yo male s/p R TKR with R shoulder pain   OT comments  Pt is at an adequate level for d/c home from OT standpoint. All DMe present in room this session as precursor to home adls. Pt and wife without further questions at this time.  Follow Up Recommendations  No OT follow up;Supervision/Assistance - 24 hour    Equipment Recommendations  3 in 1 bedside comode (present in room on arrival)    Recommendations for Other Services      Precautions / Restrictions Precautions Precautions: Knee;Fall Required Braces or Orthoses: Knee Immobilizer - Right Knee Immobilizer - Right: Discontinue once straight leg raise with < 10 degree lag Restrictions Weight Bearing Restrictions: No Other Position/Activity Restrictions: WBAT       Mobility Bed Mobility Overal bed mobility: Needs Assistance Bed Mobility: Supine to Sit     Supine to sit: Supervision     General bed mobility comments: in chair on arrival  Transfers Overall transfer level: Needs assistance Equipment used: Rolling walker (2 wheeled) Transfers: Sit to/from Stand Sit to Stand: Supervision         General transfer comment: cues for hand placement and LE management.    Balance                                   ADL Overall ADL's : Needs assistance/impaired Eating/Feeding: Independent                       Toilet Transfer: Supervision/safety;Ambulation;BSC;RW   Toileting- Clothing Manipulation and Hygiene: Supervision/safety       Functional mobility during ADLs: Supervision/safety;Rolling walker General ADL Comments: Pt with R shoulder pain with downward ( shoulder extension ) motions. pt reports most pain with depression of the scapula. pt currently with heat therapy on arrival. pt complete toilet transfer, educated on side stepping,  educated on car transfer and wife educated on 3n1 placement at home. Pt and wife educated on placing extra seating near doorway for home transfer as precaution for fatigue pt demonstrates ability to don KI with Min (A) this session      Vision                     Perception     Praxis      Cognition   Behavior During Therapy: [facility] for tasks assessed/performed Overall Cognitive Status: Within Functional Limits for tasks assessed                       Extremity/Trunk Assessment               Exercises Total Joint Exercises Ankle Circles/Pumps: AROM;Both;15 reps;Supine Quad Sets: AROM;Both;10 reps;Supine Short Arc Quad: AAROM;Right;Supine;10 reps Heel Slides: AAROM;Right;Supine;10 reps Hip ABduction/ADduction: AAROM;Right;10 reps;Supine Straight Leg Raises: AAROM;Right;10 reps;Supine Goniometric ROM: -5-45* R knee AAROM   Shoulder Instructions       General Comments      Pertinent Vitals/ Pain       Pain Assessment: Faces Pain Score: 3  Faces Pain Scale: Hurts even more Pain Location: shoulder pain Pain Descriptors / Indicators: Sore Pain Intervention(s): Limited activity within patient's tolerance;Monitored during session;Premedicated before session;Ice applied;Heat applied (ice to knee, heat to shoulder)  Home Living  Prior Functioning/Environment              Frequency Min 2X/week     Progress Toward Goals  OT Goals(current goals can now be found in the care plan section)  Progress towards OT goals: Progressing toward goals  Acute Rehab OT Goals Patient Stated Goal: Resume previous lifestyle with decreased pain, play with grandkids, 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  Plan Discharge plan remains appropriate    Co-evaluation                 End of Session Equipment Utilized During Treatment: Rolling walker CPM Right Knee CPM Right Knee: Off   Activity Tolerance Patient tolerated treatment well   Patient Left in chair;with call bell/phone within reach   Nurse Communication Mobility status;Precautions        Time: 9675-9163 OT Time Calculation (min): 20 min  Charges: OT General Charges $OT Visit: 1 Procedure OT Treatments $Self Care/Home Management : 8-22 mins  Parke Poisson B 06/17/2014, 10:37 AM Pager: [phone]

## 2014-06-17 NOTE — Progress Notes (Signed)
Patient ID: David Wall, male   DOB: [birth year], 57 y.o.   MRN: [id number] Postoperative day 2 status post right total knee arthroplasty. Patient complains of medial scapular border pain on the right shoulder with radiating pain into the right hand. This seems compared assistant with muscle spasm causing a cervical spine impingement. Recommended scapular stabilization range of motion of his neck continue with heat. Discharge to home on Monday.

## 2014-06-17 NOTE — Progress Notes (Signed)
Physical Therapy Treatment Patient Details Name: David Wall MRN: [id number] DOB: 01/23/1955 Today's Date: 06/17/2014    History of Present Illness R TKR, post op R posterior shoulder pain    PT Comments    Pt progressing with overall mobility, he walked 150' with RW and supervision. R shoulder pain is significant limiting factor, pain increases with weightbearing on RW and radiates from shoulder down to elbow with weightbearing. Encouraged pt to increase WB thru RLE to minimize strain to R shoulder. RN aware of R shoulder pain. Performed R TKA exercises with min A.    Follow Up Recommendations  Home health PT     Equipment Recommendations  Rolling walker with 5" wheels    Recommendations for Other Services OT consult     Precautions / Restrictions Precautions Precautions: Knee;Fall Required Braces or Orthoses: Knee Immobilizer - Right Knee Immobilizer - Right: Discontinue once straight leg raise with < 10 degree lag Restrictions Weight Bearing Restrictions: No Other Position/Activity Restrictions: WBAT    Mobility  Bed Mobility Overal bed mobility: Needs Assistance Bed Mobility: Supine to Sit     Supine to sit: Supervision     General bed mobility comments: instructed pt to self assist RLE with LLE, cues for technique  Transfers Overall transfer level: Needs assistance Equipment used: Rolling walker (2 wheeled) Transfers: Sit to/from Stand Sit to Stand: Supervision         General transfer comment: cues for hand placement and LE management.  Ambulation/Gait   Ambulation Distance (Feet): 150 Feet Assistive device: Rolling walker (2 wheeled) Gait Pattern/deviations: Step-to pattern;Decreased step length - right;Decreased step length - left;Trunk flexed Gait velocity: decr   General Gait Details: cues for posture, sequence and position from [employer]            Wheelchair Mobility    Modified Rankin (Stroke Patients Only)       Balance                                     Cognition Arousal/Alertness: Awake/alert Behavior During Therapy: WFL for tasks assessed/performed Overall Cognitive Status: Within Functional Limits for tasks assessed                      Exercises Total Joint Exercises Ankle Circles/Pumps: AROM;Both;15 reps;Supine Quad Sets: AROM;Both;10 reps;Supine Short Arc Quad: AAROM;Right;Supine;10 reps Heel Slides: AAROM;Right;Supine;10 reps Hip ABduction/ADduction: AAROM;Right;10 reps;Supine Straight Leg Raises: AAROM;Right;10 reps;Supine Goniometric ROM: -5-45* R knee AAROM    General Comments        Pertinent Vitals/Pain Pain Score: 3  Pain Location: 10/10 R shoulder with weight bearing using RW "it feels like a pinched nerve",  R knee 3/10 with walking Pain Descriptors / Indicators: Sore Pain Intervention(s): Limited activity within patient's tolerance;Monitored during session;Premedicated before session;Ice applied;Heat applied (ice to knee, heat to shoulder)    Home Living                      Prior Function            PT Goals (current goals can now be found in the care plan section) Acute Rehab PT Goals Patient Stated Goal: Resume previous lifestyle with decreased pain, play with grandkids, 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 PT Goal Formulation: With patient Time For Goal Achievement: 06/22/14 Potential to Achieve  Goals: Good Progress towards PT goals: Progressing toward goals    Frequency  7X/week    PT Plan Current plan remains appropriate    Co-evaluation             End of Session Equipment Utilized During Treatment: Gait belt Activity Tolerance: Patient tolerated treatment well Patient left: with call bell/phone within reach;in chair;with family/visitor present     Time: 0211-1735 PT Time Calculation (min) (ACUTE ONLY): 38 min  Charges:  $Gait Training: 8-22 mins $Therapeutic Exercise: 8-22  mins $Therapeutic Activity: 8-22 mins                    G Codes:      Philomena Doheny 06/17/2014, 9:12 AM [phone]

## 2014-06-17 NOTE — Progress Notes (Addendum)
Pt is restless,reporting pain score of 8 on right shoulder after Oxy 15mg ,robaxin 500mg ,tylenol 650 mg and Dilaudid 1 mg IV at 2115.States numbness and tingling still present on right arm from shoulder to right hand and fingers since labs were drawn from that arm early this am.Pt enc to get oob to void ,increase mobility and do exercises as taught by PT.Lays in bed to use urinal and rarely moves Refuses to wear foot pumps.K-pad in use for comfort of right shoulder. Temp 100.1 LBM was 5/27 + flatus,miralax,colace given,enc fluids,tcdb,& IS.Wife at bedside and is encouraging the same. Aggie Moats D

## 2014-06-18 LAB — CBC
HCT: 29.9 % — ABNORMAL LOW (ref 39.0–52.0)
HEMOGLOBIN: 9.9 g/dL — AB (ref 13.0–17.0)
MCH: 28.7 pg (ref 26.0–34.0)
MCHC: 33.1 g/dL (ref 30.0–36.0)
MCV: 86.7 fL (ref 78.0–100.0)
PLATELETS: 300 10*3/uL (ref 150–400)
RBC: 3.45 MIL/uL — ABNORMAL LOW (ref 4.22–5.81)
RDW: 12.7 % (ref 11.5–15.5)
WBC: 9.5 10*3/uL (ref 4.0–10.5)

## 2014-06-18 MED ORDER — RIVAROXABAN 10 MG PO TABS: 10.0000 mg | ORAL_TABLET | Freq: Every day | ORAL | Status: DC

## 2014-06-18 MED ORDER — OXYCODONE-ACETAMINOPHEN 5-325 MG PO TABS: 1.0000 | ORAL_TABLET | ORAL | Status: DC | PRN

## 2014-06-18 MED ORDER — METHOCARBAMOL 500 MG PO TABS: 500.0000 mg | ORAL_TABLET | Freq: Four times a day (QID) | ORAL | Status: DC | PRN

## 2014-06-18 NOTE — Progress Notes (Signed)
Physical Therapy Treatment Patient Details Name: David Wall MRN: [id number] DOB: 02/15/1955 Today's Date: 06/18/2014    History of Present Illness R TKR, post op R posterior shoulder pain    PT Comments    Pt progressing well with mobility. He reports sleeping better last night, R shoulder pain persists but is less intense and is not radiating today. Pt ambulated 130' in hall, performed R TKA exercises and reviewed R shoulder/neck exercises.    Follow Up Recommendations  Home health PT     Equipment Recommendations  Rolling walker with 5" wheels    Recommendations for Other Services OT consult     Precautions / Restrictions Precautions Precautions: Knee;Fall Required Braces or Orthoses: Knee Immobilizer - Right Knee Immobilizer - Right: Discontinue once straight leg raise with < 10 degree lag Restrictions Weight Bearing Restrictions: No    Mobility  Bed Mobility Overal bed mobility: Needs Assistance Bed Mobility: Supine to Sit     Supine to sit: Supervision     General bed mobility comments: instructed pt to self assist RLE with LLE, cues for technique  Transfers Overall transfer level: Modified independent Equipment used: Rolling walker (2 wheeled) Transfers: Sit to/from Stand Sit to Stand: Modified independent (Device/Increase time)         General transfer comment: good hand placement  Ambulation/Gait Ambulation/Gait assistance: Modified independent (Device/Increase time) Ambulation Distance (Feet): 130 Feet Assistive device: Rolling walker (2 wheeled) Gait Pattern/deviations: Step-through pattern;Decreased step length - right;Decreased step length - left Gait velocity: decr Gait velocity interpretation: Below normal speed for age/gender General Gait Details: cues to relax shoulders   Stairs            Wheelchair Mobility    Modified Rankin (Stroke Patients Only)       Balance                                     Cognition Arousal/Alertness: Awake/alert Behavior During Therapy: WFL for tasks assessed/performed Overall Cognitive Status: Within Functional Limits for tasks assessed                      Exercises Total Joint Exercises Ankle Circles/Pumps: AROM;Both;15 reps;Supine Quad Sets: AROM;Both;10 reps;Supine Short Arc Quad: AAROM;Right;Supine;10 reps Heel Slides: AAROM;Right;Supine;10 reps Hip ABduction/ADduction: AAROM;Right;10 reps;Supine Straight Leg Raises: AAROM;Right;10 reps;Supine Other Exercises Other Exercises: shoulder rolls and neck lateral flexion stretches in sitting   R knee flexion AAROM 50*, ext -5*; SLR -3/5    General Comments        Pertinent Vitals/Pain Pain Score: 5  Pain Location: 5/10 R shoulder, 1/10 R knee Pain Descriptors / Indicators: Sore Pain Intervention(s): Limited activity within patient's tolerance;Monitored during session;Premedicated before session;Ice applied;Heat applied (ice to knee, heat to shoulder)    Home Living                      Prior Function            PT Goals (current goals can now be found in the care plan section) Acute Rehab PT Goals Patient Stated Goal: Resume previous lifestyle with decreased pain, play with grandkids, 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 PT Goal Formulation: With patient/family Time For Goal Achievement: 06/22/14 Potential to Achieve Goals: Good Progress towards PT goals: Goals met/education completed, patient discharged from PT    Frequency    7X/week    PT Plan Current plan remains appropriate    Co-evaluation             End of Session Equipment Utilized During Treatment: Gait belt Activity Tolerance: Patient tolerated treatment well Patient left: with call bell/phone within reach;in chair;with family/visitor present     Time: 6195-0932 PT Time Calculation (min) (ACUTE ONLY): 38 min  Charges:  $Gait Training: 23-37 mins $Therapeutic  Exercise: 8-22 mins                    G Codes:      Blondell Reveal Kistler 06/18/2014, 8:16 AM

## 2014-06-18 NOTE — Progress Notes (Signed)
Pt to d/c home with [facility]. DME delivered to room prior to d/c. AVS reviewed and "My Chart" discussed with pt. Pt capable of verbalizing medications, signs and symptoms of infection, and follow-up appointments. Remains hemodynamically stable. No signs and symptoms of distress. Educated pt to return to ER in the case of SOB, dizziness, or chest pain.

## 2014-06-18 NOTE — Progress Notes (Signed)
Patient ID: David Wall, male   DOB: [birth year], 57 y.o.   MRN: [id number] Looks good overall.  Can discharge to home today.

## 2014-06-18 NOTE — Discharge Summary (Signed)
Patient ID: David Wall MRN: [id number] DOB/AGE: August 15, 1955 59 y.o.  Admit date: 06/15/2014 Discharge date: 06/18/2014  Admission Diagnoses:  Principal Problem:   Osteoarthritis of right knee Active Problems:   Status post total right knee replacement   Discharge Diagnoses:  Same  Past Medical History  Diagnosis Date  . Gout   . Asthma     hx of in childhood  . Diabetes mellitus without complication   . GERD (gastroesophageal reflux disease)   . Arthritis   . H/O urinary frequency     as noted in H&P per Dr Loanne Drilling 04/25/2014     Surgeries: Procedure(s): RIGHT TOTAL KNEE ARTHROPLASTY on 06/15/2014   Consultants:    Discharged Condition: Improved  Hospital Course: David Wall is an 59 y.o. male who was admitted 06/15/2014 for operative treatment ofOsteoarthritis of right knee. Patient has severe unremitting pain that affects sleep, daily activities, and work/hobbies. After pre-op clearance the patient was taken to the operating room on 06/15/2014 and underwent  Procedure(s): RIGHT TOTAL KNEE ARTHROPLASTY.    Patient was given perioperative antibiotics: Anti-infectives    Start     Dose/Rate Route Frequency Ordered Stop   06/15/14 1400  clindamycin (CLEOCIN) IVPB 600 mg     600 mg 100 mL/hr over 30 Minutes Intravenous Every 6 hours 06/15/14 1115 06/15/14 2019   06/15/14 0536  clindamycin (CLEOCIN) IVPB 900 mg     900 mg 100 mL/hr over 30 Minutes Intravenous On call to O.R. 06/15/14 0536 06/15/14 0800       Patient was given sequential compression devices, early ambulation, and chemoprophylaxis to prevent DVT.  Patient benefited maximally from hospital stay and there were no complications.    Recent vital signs: Patient Vitals for the past 24 hrs:  BP Temp Temp src Pulse Resp SpO2  06/18/14 0608 106/60 mmHg - - - - -  06/18/14 0600 (!) 94/48 mmHg 99.4 F (37.4 C) Oral 86 18 96 %  06/17/14 2258 - 98.2 F (36.8 C) Oral - - -  06/17/14 2119 120/79 mmHg 100.1 F  (37.8 C) Oral 94 18 98 %  06/17/14 1400 112/62 mmHg 99.4 F (37.4 C) Oral 96 16 96 %     Recent laboratory studies:  Recent Labs  06/16/14 0502 06/17/14 0531 06/18/14 0415  WBC 9.6 9.6 9.5  HGB 10.4* 9.6* 9.9*  HCT 32.7* 29.4* 29.9*  PLT 278 271 300  NA 130*  --   --   K 4.0  --   --   CL 97*  --   --   CO2 24  --   --   BUN 12  --   --   CREATININE 0.80  --   --   GLUCOSE 114*  --   --   CALCIUM 8.8*  --   --      Discharge Medications:     Medication List    STOP taking these medications        acetaminophen 500 MG tablet  Commonly known as:  TYLENOL     HYDROcodone-acetaminophen 5-325 MG per tablet  Commonly known as:  NORCO/VICODIN     ibuprofen 200 MG tablet  Commonly known as:  ADVIL,MOTRIN      TAKE these medications        methocarbamol 500 MG tablet  Commonly known as:  ROBAXIN  Take 1 tablet (500 mg total) by mouth every 6 (six) hours as needed for muscle spasms.     naproxen  500 MG tablet  Commonly known as:  NAPROSYN  Take 1 tablet (500 mg total) by mouth 2 (two) times daily.     oxyCODONE-acetaminophen 5-325 MG per tablet  Commonly known as:  ROXICET  Take 1-2 tablets by mouth every 4 (four) hours as needed.     pioglitazone 15 MG tablet  Commonly known as:  ACTOS  Take 1 tablet (15 mg total) by mouth daily.     rivaroxaban 10 MG Tabs tablet  Commonly known as:  XARELTO  Take 1 tablet (10 mg total) by mouth daily with breakfast.     tamsulosin 0.4 MG Caps capsule  Commonly known as:  FLOMAX  Take 1 capsule (0.4 mg total) by mouth daily.        Diagnostic Studies: Dg Knee Right Port  06/15/2014   CLINICAL DATA:  Status post total knee replacement  EXAM: PORTABLE RIGHT KNEE - 1-2 VIEW  COMPARISON:  April 28, 2014  FINDINGS: Frontal and lateral views were obtained. The patient is status post total knee replacement. Femoral and tibial prosthetic components appear well seated. No fracture or dislocation. Air within the joint is an  expected postoperative finding.  IMPRESSION: New prosthetic components appear well seated. No fracture or dislocation.   Electronically Signed   By: David Wall M.D.   On: 06/15/2014 09:55    Disposition: 01-Home or Self Care      Discharge Instructions    Discharge patient    Complete by:  As directed            Follow-up Information    Follow up with David Rossetti, MD In 2 weeks.   Specialty:  Orthopedic Surgery   Contact information:   [place] [address] [phone]        Signed: Mcarthur Wall 06/18/2014, 9:20 AM

## 2014-06-18 NOTE — Discharge Instructions (Signed)
Information on my medicine - XARELTO® (Rivaroxaban) ° °This medication education was reviewed with me or my healthcare representative as part of my discharge preparation.  The pharmacist that spoke with me during my hospital stay was:  Pickering, Thomas Robert, RPH ° °Why was Xarelto® prescribed for you? °Xarelto® was prescribed for you to reduce the risk of blood clots forming after orthopedic surgery. The medical term for these abnormal blood clots is venous thromboembolism (VTE). ° °What do you need to know about xarelto® ? °Take your Xarelto® ONCE DAILY at the same time every day. °You may take it either with or without food. ° °If you have difficulty swallowing the tablet whole, you may crush it and mix in applesauce just prior to taking your dose. ° °Take Xarelto® exactly as prescribed by your doctor and DO NOT stop taking Xarelto® without talking to the doctor who prescribed the medication.  Stopping without other VTE prevention medication to take the place of Xarelto® may increase your risk of developing a clot. ° °After discharge, you should have regular check-up appointments with your healthcare provider that is prescribing your Xarelto®.   ° °What do you do if you miss a dose? °If you miss a dose, take it as soon as you remember on the same day then continue your regularly scheduled once daily regimen the next day. Do not take two doses of Xarelto® on the same day.  ° °Important Safety Information °A possible side effect of Xarelto® is bleeding. You should call your healthcare provider right away if you experience any of the following: °? Bleeding from an injury or your nose that does not stop. °? Unusual colored urine (red or dark brown) or unusual colored stools (red or black). °? Unusual bruising for unknown reasons. °? A serious fall or if you hit your head (even if there is no bleeding). ° °Some medicines may interact with Xarelto® and might increase your risk of bleeding while on Xarelto®. To help  avoid this, consult your healthcare provider or pharmacist prior to using any new prescription or non-prescription medications, including herbals, vitamins, non-steroidal anti-inflammatory drugs (NSAIDs) and supplements. ° °This website has more information on Xarelto®: www.xarelto.com. ° ° °INSTRUCTIONS AFTER JOINT REPLACEMENT  ° °o Remove items at home which could result in a fall. This includes throw rugs or furniture in walking pathways °o ICE to the affected joint every three hours while awake for 30 minutes at a time, for at least the first 3-5 days, and then as needed for pain and swelling.  Continue to use ice for pain and swelling. You may notice swelling that will progress down to the foot and ankle.  This is normal after surgery.  Elevate your leg when you are not up walking on it.   °o Continue to use the breathing machine you got in the hospital (incentive spirometer) which will help keep your temperature down.  It is common for your temperature to cycle up and down following surgery, especially at night when you are not up moving around and exerting yourself.  The breathing machine keeps your lungs expanded and your temperature down. ° ° °DIET:  As you were doing prior to hospitalization, we recommend a well-balanced diet. ° °DRESSING / WOUND CARE / SHOWERING ° °Keep the surgical dressing until follow up.  The dressing is water proof, so you can shower without any extra covering.  IF THE DRESSING FALLS OFF or the wound gets wet inside, change the dressing with sterile gauze.    Please use good hand washing techniques before changing the dressing.  Do not use any lotions or creams on the incision until instructed by your surgeon.    ACTIVITY  o Increase activity slowly as tolerated, but follow the weight bearing instructions below.   o No driving for 6 weeks or until further direction given by your physician.  You cannot drive while taking narcotics.  o No lifting or carrying greater than 10 lbs.  until further directed by your surgeon. o Avoid periods of inactivity such as sitting longer than an hour when not asleep. This helps prevent blood clots.  o You may return to work once you are authorized by your doctor.     WEIGHT BEARING   Weight bearing as tolerated with assist device (walker, cane, etc) as directed, use it as long as suggested by your surgeon or therapist, typically at least 4-6 weeks.   EXERCISES  Results after joint replacement surgery are often greatly improved when you follow the exercise, range of motion and muscle strengthening exercises prescribed by your doctor. Safety measures are also important to protect the joint from further injury. Any time any of these exercises cause you to have increased pain or swelling, decrease what you are doing until you are comfortable again and then slowly increase them. If you have problems or questions, call your caregiver or physical therapist for advice.   Rehabilitation is important following a joint replacement. After just a few days of immobilization, the muscles of the leg can become weakened and shrink (atrophy).  These exercises are designed to build up the tone and strength of the thigh and leg muscles and to improve motion. Often times heat used for twenty to thirty minutes before working out will loosen up your tissues and help with improving the range of motion but do not use heat for the first two weeks following surgery (sometimes heat can increase post-operative swelling).   These exercises can be done on a training (exercise) mat, on the floor, on a table or on a bed. Use whatever works the best and is most comfortable for you.    Use music or television while you are exercising so that the exercises are a pleasant break in your day. This will make your life better with the exercises acting as a break in your routine that you can look forward to.   Perform all exercises about fifteen times, three times per day or as  directed.  You should exercise both the operative leg and the other leg as well.  Exercises include:    Quad Sets - Tighten up the muscle on the front of the thigh (Quad) and hold for 5-10 seconds.    Straight Leg Raises - With your knee straight (if you were given a brace, keep it on), lift the leg to 60 degrees, hold for 3 seconds, and slowly lower the leg.  Perform this exercise against resistance later as your leg gets stronger.   Leg Slides: Lying on your back, slowly slide your foot toward your buttocks, bending your knee up off the floor (only go as far as is comfortable). Then slowly slide your foot back down until your leg is flat on the floor again.   Angel Wings: Lying on your back spread your legs to the side as far apart as you can without causing discomfort.   Hamstring Strength:  Lying on your back, push your heel against the floor with your leg straight by tightening up the  muscles of your buttocks.  Repeat, but this time bend your knee to a comfortable angle, and push your heel against the floor.  You may put a pillow under the heel to make it more comfortable if necessary.   A rehabilitation program following joint replacement surgery can speed recovery and prevent re-injury in the future due to weakened muscles. Contact your doctor or a physical therapist for more information on knee rehabilitation.    CONSTIPATION  Constipation is defined medically as fewer than three stools per week and severe constipation as less than one stool per week.  Even if you have a regular bowel pattern at home, your normal regimen is likely to be disrupted due to multiple reasons following surgery.  Combination of anesthesia, postoperative narcotics, change in appetite and fluid intake all can affect your bowels.   YOU MUST use at least one of the following options; they are listed in order of increasing strength to get the job done.  They are all available over the counter, and you may need to  use some, POSSIBLY even all of these options:    Drink plenty of fluids (prune juice may be helpful) and high fiber foods Colace 100 mg by mouth twice a day  Senokot for constipation as directed and as needed Dulcolax (bisacodyl), take with full glass of water  Miralax (polyethylene glycol) once or twice a day as needed.  If you have tried all these things and are unable to have a bowel movement in the first 3-4 days after surgery call either your surgeon or your primary doctor.    If you experience loose stools or diarrhea, hold the medications until you stool forms back up.  If your symptoms do not get better within 1 week or if they get worse, check with your doctor.  If you experience "the worst abdominal pain ever" or develop nausea or vomiting, please contact the office immediately for further recommendations for treatment.   ITCHING:  If you experience itching with your medications, try taking only a single pain pill, or even half a pain pill at a time.  You can also use Benadryl over the counter for itching or also to help with sleep.   TED HOSE STOCKINGS:  Use stockings on both legs until for at least 2 weeks or as directed by physician office. They may be removed at night for sleeping.  MEDICATIONS:  See your medication summary on the After Visit Summary that nursing will review with you.  You may have some home medications which will be placed on hold until you complete the course of blood thinner medication.  It is important for you to complete the blood thinner medication as prescribed.  PRECAUTIONS:  If you experience chest pain or shortness of breath - call 911 immediately for transfer to the hospital emergency department.   If you develop a fever greater that 101 F, purulent drainage from wound, increased redness or drainage from wound, foul odor from the wound/dressing, or calf pain - CONTACT YOUR SURGEON.                                                   FOLLOW-UP  APPOINTMENTS:  If you do not already have a post-op appointment, please call the office for an appointment to be seen by your surgeon.  Guidelines for how  soon to be seen are listed in your “After Visit Summary”, but are typically between 1-4 weeks after surgery. ° °OTHER INSTRUCTIONS:  ° °Knee Replacement:  Do not place pillow under knee, focus on keeping the knee straight while resting. CPM instructions: 0-90 degrees, 2 hours in the morning, 2 hours in the afternoon, and 2 hours in the evening. Place foam block, curve side up under heel at all times except when in CPM or when walking.  DO NOT modify, tear, cut, or change the foam block in any way. ° °MAKE SURE YOU:  °• Understand these instructions.  °• Get help right away if you are not doing well or get worse.  ° ° °Thank you for letting us be a part of your medical care team.  It is a privilege we respect greatly.  We hope these instructions will help you stay on track for a fast and full recovery!  ° °

## 2014-06-18 NOTE — Progress Notes (Signed)
Temp down 98.2 has been sleeping snoring with no c/o. Aggie Moats D

## 2014-06-19 NOTE — Op Note (Signed)
NAMESREEKAR, BROYHILL                 ACCOUNT NO.:  [id number]  MEDICAL RECORD NO.:  [id number]  LOCATION:                                 FACILITY:  PHYSICIAN:  Lind Guest. Ninfa Linden, M.D.DATE OF BIRTH:  27-Sep-1955  DATE OF PROCEDURE:  06/15/2014 DATE OF DISCHARGE:                              OPERATIVE REPORT   PREOPERATIVE DIAGNOSIS:  Primary osteoarthritis and degenerative joint disease, right knee.  POSTOPERATIVE DIAGNOSIS:  Primary osteoarthritis and degenerative joint disease, right knee.  PROCEDURE:  Right total knee arthroplasty.  IMPLANTS:  Stryker Triathlon knee with size 5 femur, size 6 tibial tray, 11 mm fix bearing polyethylene insert, size 35 patella button.  SURGEON:  Jean Rosenthal MD.  ASSISTING:  Erskine Emery, PA-C.  ANESTHESIA:  Spinal.  TOURNIQUET TIME:  Less than 1 hour.  BLOOD LOSS:  Less than 100 mL.  ANTIBIOTICS:  900 mg of IV clindamycin.  COMPLICATIONS:  None.  INDICATIONS:  Mr. Kem is a 59 year old gentleman with known primary osteoarthritis involving his right knee.  He has failed numerous injections in that knee and has recurrent effusions.  An MRI did show foot areas of full-thickness cartilage, loss of the medial femoral condyle, the medial tibial plateau, and patellofemoral joint with extensive medial meniscal tearing.  He understood the standard arthroscopic intervention would not be as helpful given the severity of his arthritis.  He has a varus deformity of his knee as well and a mild flexion contracture.  At this point due to his daily pain, he has decreased mobility, decreased quality of life, as well as the impact it has had on his activities of daily living, he lives wishes to proceed with a total knee arthroplasty.  He understands the risks of acute blood loss anemia, nerve and vessel injury, fracture, infection, and DVT.  He understands the goals are decreased pain, improved mobility, and overall improved quality of  life.  DESCRIPTION OF PROCEDURE:  After informed consent was obtained, appropriate right knee was marked.  He was brought to the operating room and spinal anesthesia was obtained.  He was then laid in a supine position.  A Foley catheter was placed.  A nonsterile tourniquet placed on his upper right thigh.  His right leg was then prepped and draped from the thigh down to the ankle with DuraPrep and sterile drapes including a sterile stockinette.  A time-out was called to identify correct patient and correct right knee.  I then used an Esmarch to wrap out the leg and tourniquet was inflated to 300 mm of pressure.  We then made a direct midline incision over the patella and carried this proximally and distally.  We dissected down the knee joint and carried out a medial parapatellar arthrotomy finding a significant large joint effusion and significant synovitis in the knee.  We inverted the patella, removed some synovium from the knee, and then periarticular osteophytes.  We found areas of full-thickness cartilage loss on the patella, the patellofemoral joint, and medial femoral condyle.  With the knee in a flexed position, I removed remnants of ACL, PCL, medial and lateral meniscus.  I then used our extramedullary tibial cutting guide, setting for  it, taking 9 mm off the high side, neutral slope and correcting the varus and valgus.  We then made this cut without difficulty.  We then went to the femur and an intercondylar drill hole was made for the intramedullary guide for taking a 10 mm distal femoral cut.  I decided on this cut based on his mild flexion contracture.  We also set this for 5 degrees, externally rotated, and made this cut without difficulty.  We brought the knee back down into extension with a 9 mm extension block, he had full extension, actually a little bit of hyper-extension.  We then went back to the femur and put our femoral sizing guide.  Based on the epicondylar axis  and Whiteside's line with 3 degrees right, we chose a size 5 femur.  We then put our 4-in-1 cutting block for a size 5 femur, made our anterior posterior cuts followed by our chamfer cuts, and we then made femoral box cut as well.  With 90 degrees of extension, we put extension block as well, and I was pleased with this.  We then went back to the tibia and trialed for a size 6 tibia.  We made our keel punch off this and with the trial 6 tibia and a trial 5 femur, we placed 11 mm fix bearing polyethylene insert trial.  I was pleased with the range of motion and stability.  We then made our patellar cut, taking 11 mm off the undersurface of the patella and drilled 3 holes for 35 patellar button.  With all trial components in place, again I was pleased with stability and range of motion.  We then removed all trial components and irrigated the knee with normal saline solution using pulsatile lavage.  We mixed the bones with cement and then cemented our real Stryker Triathlon tibial tray, size 6, followed by the real size 5 femur.  We removed cement debris from the knee and then placed the real 11 mm fix bearing polyethylene insert.  We also cemented the patellar button.  Once the cement had hardened and dried, tourniquet was let down.  Hemostasis was obtained with electrocautery. We then irrigated the knee again with normal saline solution and closed the arthrotomy with interrupted #1 Vicryl followed by 0 Vicryl in the deep tissue, 2-0 Vicryl in subcutaneous tissue, 4-0 Monocryl subcuticular stitch, and Steri-Strips on the skin.  Well-padded sterile dressing was applied, and he was taken to recovery room in stable condition.  All final counts were correct.  There were no complications noted.  Of note, Erskine Emery, PA-C assisted in the entire case and his assistance was crucial to facilitating all aspects of this case.     Lind Guest. Ninfa Linden, M.D.     CYB/MEDQ  D:  06/15/2014   T:  06/16/2014  Job:  [id number]

## 2014-06-24 ENCOUNTER — Emergency Department (HOSPITAL_COMMUNITY)
Admission: EM | Admit: 2014-06-24 | Discharge: 2014-06-24 | Discharge status: Against Medical Advice | Payer: BLUE CROSS/BLUE SHIELD | Attending: Emergency Medicine | Admitting: Emergency Medicine

## 2014-06-24 ENCOUNTER — Encounter (HOSPITAL_COMMUNITY): Payer: Self-pay | Admitting: Emergency Medicine

## 2014-06-24 DIAGNOSIS — E119 Type 2 diabetes mellitus without complications: Secondary | ICD-10-CM | POA: Diagnosis not present

## 2014-06-24 DIAGNOSIS — J45909 Unspecified asthma, uncomplicated: Secondary | ICD-10-CM | POA: Diagnosis not present

## 2014-06-24 DIAGNOSIS — M25511 Pain in right shoulder: Secondary | ICD-10-CM | POA: Insufficient documentation

## 2014-06-24 NOTE — ED Notes (Signed)
Pt states he has a doctors appointment in the morning and is going home

## 2014-06-24 NOTE — ED Notes (Signed)
Pt states he had knee replacement on Friday before last and since the surgery he has been having right shoulder pain  Pt states the pain has progressively gotten worse  Pt states now the pain is worse and now radiates down his arm and has tingling in his hand  Pt states he has had some dizziness today as well

## 2014-07-02 ENCOUNTER — Ambulatory Visit: Payer: BLUE CROSS/BLUE SHIELD | Attending: Orthopaedic Surgery | Admitting: Physical Therapy

## 2014-07-02 DIAGNOSIS — R29898 Other symptoms and signs involving the musculoskeletal system: Secondary | ICD-10-CM | POA: Diagnosis not present

## 2014-07-02 DIAGNOSIS — M25561 Pain in right knee: Secondary | ICD-10-CM | POA: Insufficient documentation

## 2014-07-02 DIAGNOSIS — M25661 Stiffness of right knee, not elsewhere classified: Secondary | ICD-10-CM

## 2014-07-02 DIAGNOSIS — M25511 Pain in right shoulder: Secondary | ICD-10-CM

## 2014-07-02 DIAGNOSIS — Z723 Lack of physical exercise: Secondary | ICD-10-CM

## 2014-07-02 DIAGNOSIS — R262 Difficulty in walking, not elsewhere classified: Secondary | ICD-10-CM

## 2014-07-02 NOTE — Patient Instructions (Signed)
Knee Extension Mobilization: Towel Prop   With rolled towel under right ankle,  Hold ____ minutes. Perform quad set, Hold for 3-5 secondes Repeat _10_ times per set. Do __2__ sets per session. Do _2-3___ sessions per day.  Hamstring Step 1   Straighten left knee. Keep knee level with other knee or on bolster. Hold _30-60__ seconds. Relax knee by returning foot to start. Repeat __3_ times.  KNEE: Knee Hang - Prone   Lie on stomach. Place towel above knee; hang feet off surface. Keep feet straight. Hold 60__ seconds. _10__ reps per set, _1__ set 1-2 times a day. Add __2_ lb weights to ankles.  Voncille Lo, PT 07/02/2014 1:25 PM Phone: [phone] Fax: [phone]

## 2014-07-02 NOTE — Therapy (Signed)
[place], [place], [address] Phone: [phone]   Fax:  [phone]  Physical Therapy Evaluation  Patient Details  Name: David Wall MRN: [id number] Date of Birth: Jan 08, 1956 Referring Provider:  Mcarthur Rossetti*  Encounter Date: 07/02/2014      PT End of Session - 07/02/14 1239    Visit Number 1   Number of Visits 16   Date for PT Re-Evaluation 08/27/14   Authorization Type [employer]   Authorization Time Period 08/27/14   PT Start Time 1236   PT Stop Time 1330   PT Time Calculation (min) 54 min      Past Medical History  Diagnosis Date  . Gout   . Asthma     hx of in childhood  . Diabetes mellitus without complication   . GERD (gastroesophageal reflux disease)   . Arthritis   . H/O urinary frequency     as noted in H&P per Dr Loanne Drilling 04/25/2014     Past Surgical History  Procedure Laterality Date  . Eye surgery      laser surgery bilat   . Upper gi endoscopy    . Total knee arthroplasty Right 06/15/2014    Procedure: RIGHT TOTAL KNEE ARTHROPLASTY;  Surgeon: Mcarthur Rossetti, MD;  Location: [facility];  Service: Orthopedics;  Laterality: Right;    There were no vitals filed for this visit.  Visit Diagnosis:  Knee pain, right  Shoulder pain, right  Difficulty walking  Inactivity  Decreased ROM of right knee      Subjective Assessment - 07/02/14 1253    Subjective R knee pain 1/10 discomfort, cramps,  Walking with the cane   Patient is accompained by: Family member  wife   Pertinent History R TKR 06-15-14   Limitations Standing;Walking   How long can you sit comfortably? unlimited    How long can you stand comfortably? 15 min   How long can you walk comfortably? 15 min   Patient Stated Goals Walk without a cane,, negotiating steps  without a cane   Currently in Pain? Yes   Pain Score 1    Pain Location Knee   Pain Orientation Right   Pain Descriptors / Indicators  Discomfort   Pain Type Chronic pain   Pain Onset 1 to 4 weeks ago   Pain Frequency Intermittent   Aggravating Factors  bending knee   Pain Relieving Factors ice and elevation and medication   Multiple Pain Sites Yes   Pain Score 6   Pain Location Shoulder   Pain Orientation Right   Pain Descriptors / Indicators Spasm;Aching   Pain Type Acute pain   Pain Onset 1 to 4 weeks ago   Pain Frequency Constant   Aggravating Factors  Since  surgery pt had pain especially using walker            [facility] PT Assessment - 07/02/14 1258    Assessment   Medical Diagnosis Right TKR   Onset Date/Surgical Date 06/15/14   Hand Dominance Right   Prior Therapy HHPT 8 visits   Precautions   Precautions Knee   Precaution Comments dont' over exert   Required Braces or Orthoses --  none   Restrictions   Weight Bearing Restrictions Yes   Balance Screen   Has the patient fallen in the past 6 months No   Has the patient had a decrease in activity level because of a fear of falling?  No  Is the patient reluctant to leave their home because of a fear of falling?  No   Home Environment   Living Environment Private residence   Type of [place] to enter   Entrance Stairs-Number of Steps [place] One level   Prior Function   Level of Independence Independent;Independent with basic ADLs;Independent with household mobility without device;Independent with community mobility without device   Cognition   Overall Cognitive Status Within Functional Limits for tasks assessed   Observation/Other Assessments   Observations Edema upper pole patella R/L 48/43cm, lower 41/37 cm    Focus on Therapeutic Outcomes (FOTO)  intake 53%, liimitation 47% predicted 30%   AROM   Right Knee Extension 10   Right Knee Flexion 133   Left Knee Extension 2   Left Knee Flexion 103   Right Ankle Dorsiflexion -5   Right Ankle Plantar Flexion 45   Left Ankle Dorsiflexion 5   Left Ankle Plantar  Flexion 55   Strength   Right Hip Flexion 4+/5   Right Hip ABduction 4-/5   Left Hip Flexion 5/5   Left Hip ABduction 4/5   Right Knee Flexion 4+/5   Right Knee Extension 4/5   Left Knee Flexion 5/5   Left Knee Extension 5/5   Palpation   Palpation comment Tenderness over R surgical site and increased warmth due to edema                   [facility] Adult PT Treatment/Exercise - 07/02/14 1315    Knee/Hip Exercises: Stretches   Passive Hamstring Stretch 3 reps;30 seconds  VC for relaxation of shoulders while holding sheet   Passive Hamstring Stretch Limitations 60 degrees    Knee/Hip Exercises: Supine   Quad Sets 1 set;Right;10 reps  with heel elevated with towel, hold for 5 sec   Knee/Hip Exercises: Prone   Other Prone Exercises knee extension with 2 lb wt with towel above knee for proloned stretch   Modalities   Modalities Vasopneumatic   Vasopneumatic   Number Minutes Vasopneumatic  15 minutes   Vasopnuematic Location  Knee  Right   Vasopneumatic Pressure Medium   Vasopneumatic Temperature  --  32 degrees                PT Education - 07/02/14 1315    Education provided Yes   Education Details Explanation of findings, Edema explanation and intial HEP for increased knee extension   Person(s) Educated Patient   Methods Explanation;Demonstration;Tactile cues;Verbal cues;Handout   Comprehension Verbalized understanding;Returned demonstration;Need further instruction;Verbal cues required          PT Short Term Goals - 07/02/14 1247    PT SHORT TERM GOAL #1   Title Pt will be independent with initial HEP knee   Time 4   Period Weeks   Status New   PT SHORT TERM GOAL #2   Title Report pain decrease with negotiating steps decreased by 50%using SPC   Time 4   Period Weeks   Status New   PT SHORT TERM GOAL #3   Title Demonstrate and verbalize understanding of condition management including RICE, positioning, use of AD , HEP   Time 4   Period Weeks    Status New   PT SHORT TERM GOAL #4   Title Pt to be evaluated by MD [facility] MRI by next visit for R shoulder. eval and make goals for Right shoulder   Time 2  Period Weeks   Status New           PT Long Term Goals - 07/02/14 1434    PT LONG TERM GOAL #1   Title Pt will be independent with advanced HEP   Time 8   Period Weeks   Status New   PT LONG TERM GOAL #2   Title Pain will decreased to 0/10 with all functional activities   Time 8   Period Weeks   Status New   PT LONG TERM GOAL #3   Title Right knee AROM will be +5 ext to at least +115 for improved mobility to ride in car and negotiate steps   Time 8   Period Weeks   Status New   PT LONG TERM GOAL #4   Title FOTO will improve from 47% limitation to 30% limitatiaon indicating improved functional mobililty   Time 8   Period Weeks   Status New   PT LONG TERM GOAL #5   Title Pt will improve strength symmetrically inLE's to begin home walking program and return to exercise in community   Time 8   Period Weeks   Status New   Additional Long Term Goals   Additional Long Term Goals Yes               Plan - 07/02/14 1438    Clinical Impression Statement Pt is a 59 yo male  s/p R TKA on 06/15/14 by Dr. Ninfa Linden. Pt present with impairments including pain, knee weakness, imparied AROM , difficulty with walking, stairs and transtitional movements into and out of car.  Pt presents with recent pain in Right shoulder from surgery and exacerbated by using a walker post TKR.  Pt now utilizes a [facility] but is scheduled for an MRI of R shoulder. Pt would benefit from skilled PT 2 x a week for 8 weeks to address R knee and R shoulder issues as well as balance and fall prevention as per MD order.  Pt also has increased edema and heat in R knee.  Circumferential measurement uppr pole of patellar R/L  48 cm/41 cm.    Pt will benefit from skilled therapeutic intervention in order to improve on the following deficits Abnormal  gait;Decreased activity tolerance;Decreased balance;Decreased endurance;Decreased range of motion;Decreased mobility;Decreased strength;Decreased scar mobility;Difficulty walking;Increased fascial restricitons;Increased edema;Pain;Postural dysfunction;Improper body mechanics;Impaired flexibility   Rehab Potential Excellent   PT Frequency 2x / week   PT Duration 8 weeks   PT Treatment/Interventions ADLs/Self Care Home Management;Cryotherapy;Electrical Stimulation;Moist Heat;Stair training;Functional mobility training;Gait training;Therapeutic exercise;Balance training;Neuromuscular re-education;Manual techniques;Patient/family education;Scar mobilization;Passive range of motion;Dry needling;Vasopneumatic Device;Taping   PT Next Visit Plan Soft tissue mobilization,  Shoulder evaluation with order from MD, progress knee AROM   PT Home Exercise Plan knee extension exercises, prone with wt, hamstring stretch and quad set with heel elevated with towel.   Consulted and Agree with Plan of Care Patient         Problem List Patient Active Problem List   Diagnosis Date Noted  . Osteoarthritis of right knee 06/15/2014  . Status post total right knee replacement 06/15/2014  . Sleep disorder 04/25/2014  . Inguinal hernia 12/21/2013  . Diabetes 10/11/2013  . Routine general medical examination at a health care facility 10/10/2013  . Screening for prostate cancer 10/10/2013  . GOUT 04/26/2008  . RECTAL BLEEDING 04/26/2008  . CHEST PAIN UNSPECIFIED 04/25/2007  . HYPERCHOLESTEROLEMIA 01/26/2007  . UNSPECIFIED CHRONIC ISCHEMIC HEART DISEASE 01/26/2007  . CERVICAL RADICULOPATHY, RIGHT 01/26/2007  .  INSOMNIA 01/26/2007   Voncille Lo, PT 07/02/2014 2:57 PM Phone: [phone] Fax: [facility] [address] [place], [place], [address] Phone: [phone]   Fax:  [phone]

## 2014-07-04 ENCOUNTER — Other Ambulatory Visit: Payer: Self-pay | Admitting: Orthopaedic Surgery

## 2014-07-04 DIAGNOSIS — M25511 Pain in right shoulder: Secondary | ICD-10-CM

## 2014-07-09 ENCOUNTER — Ambulatory Visit: Payer: BLUE CROSS/BLUE SHIELD

## 2014-07-09 DIAGNOSIS — R29898 Other symptoms and signs involving the musculoskeletal system: Secondary | ICD-10-CM

## 2014-07-09 DIAGNOSIS — M25661 Stiffness of right knee, not elsewhere classified: Secondary | ICD-10-CM

## 2014-07-09 DIAGNOSIS — M25561 Pain in right knee: Secondary | ICD-10-CM

## 2014-07-09 NOTE — Therapy (Signed)
[place], [place], [address] Phone: [phone]   Fax:  [phone]  Physical Therapy Treatment  Patient Details  Name: David Wall MRN: [id number] Date of Birth: January 29, 1955 Referring Provider:  Renato Shin, MD  Encounter Date: 07/09/2014      PT End of Session - 07/09/14 1227    Visit Number 2   Number of Visits 16   Date for PT Re-Evaluation 08/27/14   PT Start Time 1100   PT Stop Time 1155   PT Time Calculation (min) 55 min   Activity Tolerance Patient tolerated treatment well   Behavior During Therapy [facility] for tasks assessed/performed      Past Medical History  Diagnosis Date  . Gout   . Asthma     hx of in childhood  . Diabetes mellitus without complication   . GERD (gastroesophageal reflux disease)   . Arthritis   . H/O urinary frequency     as noted in H&P per Dr Loanne Drilling 04/25/2014     Past Surgical History  Procedure Laterality Date  . Eye surgery      laser surgery bilat   . Upper gi endoscopy    . Total knee arthroplasty Right 06/15/2014    Procedure: RIGHT TOTAL KNEE ARTHROPLASTY;  Surgeon: Mcarthur Rossetti, MD;  Location: [facility];  Service: Orthopedics;  Laterality: Right;    There were no vitals filed for this visit.  Visit Diagnosis:  Knee pain, right  Decreased ROM of right knee  Weakness of right leg      Subjective Assessment - 07/09/14 1103    Subjective RT knee setback. Can't bend as much. Think I was on it more. More discomfort. RT shoulder painh . MRI end of month   Currently in Pain? Yes   Pain Score 2    Pain Location Knee   Pain Orientation Right   Pain Descriptors / Indicators Discomfort   Pain Type Chronic pain   Pain Onset 1 to 4 weeks ago   Pain Frequency Intermittent   Aggravating Factors  Bending    Pain Relieving Factors ice elevation, meds   Multiple Pain Sites Yes            [facility] PT Assessment - 07/09/14 1124     Observation/Other Assessments-Edema    Edema Circumferential  48 cm at superior pole of patella   AROM   Right Knee Extension 20   Right Knee Flexion 97                     [facility] Adult PT Treatment/Exercise - 07/09/14 1106    Knee/Hip Exercises: Aerobic   Stationary Bike Nustep L5 seat 14, 6 min   Knee/Hip Exercises: Standing   Functional Squat --  12x sit to stand no UE assist   Knee/Hip Exercises: Seated   Long Arc Quad Strengthening;Right;1 set;15 reps   Long Arc Quad Weight 4 lbs.   Knee/Hip Exercises: Supine   Bridges Strengthening;Both;1 set;20 reps   Bridges Limitations legs on orange ball   Other Supine Knee Exercises LE flexion /extension x 25 fet on orange ball   Vasopneumatic   Number Minutes Vasopneumatic  15 minutes   Vasopnuematic Location  Knee   Vasopneumatic Pressure Medium   Vasopneumatic Temperature  32   Manual Therapy   Manual Therapy Soft tissue mobilization;Joint mobilization   Joint Mobilization patella and AP tibial glides 30 x 4    Soft tissue mobilization  With use of Rock blade and  hands for scer mobs and retrograde  massage.                   PT Short Term Goals - 07/02/14 1247    PT SHORT TERM GOAL #1   Title Pt will be independent with initial HEP knee   Time 4   Period Weeks   Status New   PT SHORT TERM GOAL #2   Title Report pain decrease with negotiating steps decreased by 50%using SPC   Time 4   Period Weeks   Status New   PT SHORT TERM GOAL #3   Title Demonstrate and verbalize understanding of condition management including RICE, positioning, use of AD , HEP   Time 4   Period Weeks   Status New   PT SHORT TERM GOAL #4   Title Pt to be evaluated by MD [facility] by next visit for R shoulder. eval and make goals for Right shoulder   Time 2   Period Weeks   Status New           PT Long Term Goals - 07/02/14 1434    PT LONG TERM GOAL #1   Title Pt will be independent with advanced HEP   Time 8    Period Weeks   Status New   PT LONG TERM GOAL #2   Title Pain will decreased to 0/10 with all functional activities   Time 8   Period Weeks   Status New   PT LONG TERM GOAL #3   Title Right knee AROM will be +5 ext to at least +115 for improved mobility to ride in car and negotiate steps   Time 8   Period Weeks   Status New   PT LONG TERM GOAL #4   Title FOTO will improve from 47% limitation to 30% limitatiaon indicating improved functional mobililty   Time 8   Period Weeks   Status New   PT LONG TERM GOAL #5   Title Pt will improve strength symmetrically inLE's to begin home walking program and return to exercise in community   Time 8   Period Weeks   Status New   Additional Long Term Goals   Additional Long Term Goals Yes               Plan - 07/09/14 1227    Clinical Impression Statement Mr Ohlin tolerated treatment well and he had 107 degrees act assist flexion at end of sesson.     PT Next Visit Plan Soft tissue mobilization,  Shoulder evaluation with order from MD, progress knee AROM, manual, vaso as needed   Consulted and Agree with Plan of Care Patient        Problem List Patient Active Problem List   Diagnosis Date Noted  . Osteoarthritis of right knee 06/15/2014  . Status post total right knee replacement 06/15/2014  . Sleep disorder 04/25/2014  . Inguinal hernia 12/21/2013  . Diabetes 10/11/2013  . Routine general medical examination at a health care facility 10/10/2013  . Screening for prostate cancer 10/10/2013  . GOUT 04/26/2008  . RECTAL BLEEDING 04/26/2008  . CHEST PAIN UNSPECIFIED 04/25/2007  . HYPERCHOLESTEROLEMIA 01/26/2007  . UNSPECIFIED CHRONIC ISCHEMIC HEART DISEASE 01/26/2007  . CERVICAL RADICULOPATHY, RIGHT 01/26/2007  . INSOMNIA 01/26/2007    Darrel Hoover PT 07/09/2014, 12:29 PM  [facility] [address] [place], [place], [address] Phone: [phone]  Fax:   [phone]

## 2014-07-13 ENCOUNTER — Ambulatory Visit: Payer: BLUE CROSS/BLUE SHIELD

## 2014-07-13 DIAGNOSIS — R29898 Other symptoms and signs involving the musculoskeletal system: Secondary | ICD-10-CM

## 2014-07-13 DIAGNOSIS — M25661 Stiffness of right knee, not elsewhere classified: Secondary | ICD-10-CM

## 2014-07-13 DIAGNOSIS — M25561 Pain in right knee: Secondary | ICD-10-CM | POA: Diagnosis not present

## 2014-07-13 NOTE — Therapy (Signed)
[place], [place], [address] Phone: [phone]   Fax:  [phone]  Physical Therapy Treatment  Patient Details  Name: David Wall MRN: [id number] Date of Birth: 1955-03-23 Referring Provider:  Renato Shin, MD  Encounter Date: 07/13/2014      PT End of Session - 07/13/14 0949    Visit Number 3   Number of Visits 16   Date for PT Re-Evaluation 08/27/14   PT Start Time 0845   PT Stop Time 1008   PT Time Calculation (min) 83 min   Activity Tolerance Patient tolerated treatment well   Behavior During Therapy [facility] for tasks assessed/performed      Past Medical History  Diagnosis Date  . Gout   . Asthma     hx of in childhood  . Diabetes mellitus without complication   . GERD (gastroesophageal reflux disease)   . Arthritis   . H/O urinary frequency     as noted in H&P per Dr Loanne Drilling 04/25/2014     Past Surgical History  Procedure Laterality Date  . Eye surgery      laser surgery bilat   . Upper gi endoscopy    . Total knee arthroplasty Right 06/15/2014    Procedure: RIGHT TOTAL KNEE ARTHROPLASTY;  Surgeon: Mcarthur Rossetti, MD;  Location: [facility];  Service: Orthopedics;  Laterality: Right;    There were no vitals filed for this visit.  Visit Diagnosis:  Knee pain, right  Decreased ROM of right knee  Weakness of right leg      Subjective Assessment - 07/13/14 0853    Subjective Hanging in there. Shouder painonly, knee fine   Currently in Pain? No/denies   Multiple Pain Sites No            [facility] PT Assessment - 07/13/14 0947    AROM   Left Knee Extension -15   Left Knee Flexion 105                     [facility] Adult PT Treatment/Exercise - 07/13/14 0854    Knee/Hip Exercises: Aerobic   Stationary Bike Nustep L5 seat 14, 6 min   Knee/Hip Exercises: Standing   Lateral Step Up Right;1 set;15 reps   Forward Step Up Right;1 set;15 reps   Functional Squat  Limitations Bilateral hip hinge x 20 with verbal/tactilem cues and demo by PT    Wall Squat 1 set;15 reps;3 seconds   Other Standing Knee Exercises Foot on 18 inch platform knee flexion stretch x 15 then 30 sec x 3   Knee/Hip Exercises: Seated   Long Arc Quad Strengthening;Right;1 set;20 reps   Long Arc Quad Weight 5 lbs.   Knee/Hip Exercises: Supine   Quad Sets 1 set;Right;20 reps   Other Supine Knee/Hip Exercises LE flexion /extension x 25 fet on orange ball, then bridge x 15    Vasopneumatic   Number Minutes Vasopneumatic  15 minutes   Vasopnuematic Location  Knee   Vasopneumatic Pressure Medium   Vasopneumatic Temperature  32   Manual Therapy   Soft tissue mobilization posterior LT thigh and calf/knee with rock blade. Followed by gentle strech and TKE prone.   passive extension -10 degrees                PT Education - 07/13/14 0949    Education provided Yes   Education Details prone leg hang   Person(s) Educated Patient   Methods Explanation;Demonstration;Handout  Comprehension Returned demonstration          PT Short Term Goals - 07/02/14 1247    PT SHORT TERM GOAL #1   Title Pt will be independent with initial HEP knee   Time 4   Period Weeks   Status New   PT SHORT TERM GOAL #2   Title Report pain decrease with negotiating steps decreased by 50%using SPC   Time 4   Period Weeks   Status New   PT SHORT TERM GOAL #3   Title Demonstrate and verbalize understanding of condition management including RICE, positioning, use of AD , HEP   Time 4   Period Weeks   Status New   PT SHORT TERM GOAL #4   Title Pt to be evaluated by MD [facility] by next visit for R shoulder. eval and make goals for Right shoulder   Time 2   Period Weeks   Status New           PT Long Term Goals - 07/02/14 1434    PT LONG TERM GOAL #1   Title Pt will be independent with advanced HEP   Time 8   Period Weeks   Status New   PT LONG TERM GOAL #2   Title Pain will  decreased to 0/10 with all functional activities   Time 8   Period Weeks   Status New   PT LONG TERM GOAL #3   Title Right knee AROM will be +5 ext to at least +115 for improved mobility to ride in car and negotiate steps   Time 8   Period Weeks   Status New   PT LONG TERM GOAL #4   Title FOTO will improve from 47% limitation to 30% limitatiaon indicating improved functional mobililty   Time 8   Period Weeks   Status New   PT LONG TERM GOAL #5   Title Pt will improve strength symmetrically inLE's to begin home walking program and return to exercise in community   Time 8   Period Weeks   Status New   Additional Long Term Goals   Additional Long Term Goals Yes               Plan - 07/13/14 0950    Clinical Impression Statement David Wall is doing well with minimal pain and good tolerance for exercise. He is still limited with ROM  and we will push this at patient  tolerance    PT Next Visit Plan Soft tissue mobilization,  , progress knee AROM, manual, vaso as needed, strengthening   Consulted and Agree with Plan of Care Patient        Problem List Patient Active Problem List   Diagnosis Date Noted  . Osteoarthritis of right knee 06/15/2014  . Status post total right knee replacement 06/15/2014  . Sleep disorder 04/25/2014  . Inguinal hernia 12/21/2013  . Diabetes 10/11/2013  . Routine general medical examination at a health care facility 10/10/2013  . Screening for prostate cancer 10/10/2013  . GOUT 04/26/2008  . RECTAL BLEEDING 04/26/2008  . CHEST PAIN UNSPECIFIED 04/25/2007  . HYPERCHOLESTEROLEMIA 01/26/2007  . UNSPECIFIED CHRONIC ISCHEMIC HEART DISEASE 01/26/2007  . CERVICAL RADICULOPATHY, RIGHT 01/26/2007  . INSOMNIA 01/26/2007    Darrel Hoover PT 07/13/2014, 9:54 AM  [facility] [address] [place], [place], [address] Phone: [phone]   Fax:  [phone]

## 2014-07-13 NOTE — Patient Instructions (Signed)
Knee Extension Mobilization: Hang (Prone)   With table supporting thighs, place ____ pound weight on right ankle. Hold 1-5____ minutes.  Leg out as far as you are able to stay for a few minutes Repeat ____ times per set. Do ____ sets per session. Do ____ sessions per day.  http://orth.exer.us/722   Copyright  VHI. All rights reserved.

## 2014-07-16 ENCOUNTER — Ambulatory Visit: Payer: BLUE CROSS/BLUE SHIELD

## 2014-07-16 DIAGNOSIS — M25561 Pain in right knee: Secondary | ICD-10-CM | POA: Diagnosis not present

## 2014-07-16 DIAGNOSIS — M25661 Stiffness of right knee, not elsewhere classified: Secondary | ICD-10-CM

## 2014-07-16 DIAGNOSIS — R29898 Other symptoms and signs involving the musculoskeletal system: Secondary | ICD-10-CM

## 2014-07-16 NOTE — Patient Instructions (Signed)
Discussed need for towel roll or hand for cue with TKE/QS in supine to max range

## 2014-07-16 NOTE — Therapy (Signed)
[place], [place], [address] Phone: [phone]   Fax:  [phone]  Physical Therapy Treatment  Patient Details  Name: David Wall MRN: [id number] Date of Birth: 15-Oct-1955 Referring Provider:  Renato Shin, MD  Encounter Date: 07/16/2014      PT End of Session - 07/16/14 1141    Visit Number 4   Number of Visits 16   Date for PT Re-Evaluation 08/27/14   PT Start Time 1100   PT Stop Time 1155   PT Time Calculation (min) 55 min   Activity Tolerance Patient tolerated treatment well   Behavior During Therapy [facility] for tasks assessed/performed      Past Medical History  Diagnosis Date  . Gout   . Asthma     hx of in childhood  . Diabetes mellitus without complication   . GERD (gastroesophageal reflux disease)   . Arthritis   . H/O urinary frequency     as noted in H&P per Dr Loanne Drilling 04/25/2014     Past Surgical History  Procedure Laterality Date  . Eye surgery      laser surgery bilat   . Upper gi endoscopy    . Total knee arthroplasty Right 06/15/2014    Procedure: RIGHT TOTAL KNEE ARTHROPLASTY;  Surgeon: Mcarthur Rossetti, MD;  Location: [facility];  Service: Orthopedics;  Laterality: Right;    There were no vitals filed for this visit.  Visit Diagnosis:  Knee pain, right  Decreased ROM of right knee  Weakness of right leg                       [facility] Adult PT Treatment/Exercise - 07/16/14 1055    Knee/Hip Exercises: Aerobic   Stationary Bike Nustep L5 seat 14, 8 min   Knee/Hip Exercises: Standing   Forward Step Up Right;1 set;15 reps   Forward Step Up Limitations with Lt leg stepping up  in air wiht LE flexion.    Wall Squat 1 set;15 reps;5 seconds   Knee/Hip Exercises: Seated   Long Arc Quad Strengthening;Right;1 set;20 reps   Long Arc Quad Weight 7 lbs.   Knee/Hip Exercises: Supine   Quad Sets AROM;Right;3 sets;15 reps   Short Arc [employer]  Strengthening;Right;1 set;20 reps   Short Arc [employer] Limitations 5 pounds    Knee/Hip Exercises: Prone   Other Prone Exercises TKE ontoes x 15   Vasopneumatic   Number Minutes Vasopneumatic  15 minutes   Vasopnuematic Location  Knee   Vasopneumatic Pressure Medium   Vasopneumatic Temperature  35   Manual Therapy   Joint Mobilization patella and PA tibial glides 30 x 4    Soft tissue mobilization posterior LT thigh and calf/knee with rock blade. Followed by gentle strech and TKE prone.   passive extension -10 degrees                  PT Short Term Goals - 07/16/14 1142    PT SHORT TERM GOAL #1   Title Pt will be independent with initial HEP knee   Status Achieved   PT SHORT TERM GOAL #2   Title Report pain decrease with negotiating steps decreased by 50%using SPC   Status Achieved   PT SHORT TERM GOAL #3   Title Demonstrate and verbalize understanding of condition management including RICE, positioning, use of AD , HEP   Status Achieved   PT SHORT TERM GOAL #4   Title  Pt to be evaluated by MD [facility] by next visit for R shoulder. eval and make goals for Right shoulder   Status On-going           PT Long Term Goals - 07/02/14 1434    PT LONG TERM GOAL #1   Title Pt will be independent with advanced HEP   Time 8   Period Weeks   Status New   PT LONG TERM GOAL #2   Title Pain will decreased to 0/10 with all functional activities   Time 8   Period Weeks   Status New   PT LONG TERM GOAL #3   Title Right knee AROM will be +5 ext to at least +115 for improved mobility to ride in car and negotiate steps   Time 8   Period Weeks   Status New   PT LONG TERM GOAL #4   Title FOTO will improve from 47% limitation to 30% limitatiaon indicating improved functional mobililty   Time 8   Period Weeks   Status New   PT LONG TERM GOAL #5   Title Pt will improve strength symmetrically inLE's to begin home walking program and return to exercise in community   Time 8    Period Weeks   Status New   Additional Long Term Goals   Additional Long Term Goals Yes               Plan - 07/16/14 1141    Clinical Impression Statement Mr Huezo reports pain with knee extension range  but he was able to relax enough to max stretching. Flexion active 100 degrees today.    PT Next Visit Plan Soft tissue mobilization,  , progress knee AROM, manual, vaso as needed, strengthening   PT Home Exercise Plan knee extension exercises, prone with wt, hamstring stretch and quad set with heel elevated with towel.   Consulted and Agree with Plan of Care Patient        Problem List Patient Active Problem List   Diagnosis Date Noted  . Osteoarthritis of right knee 06/15/2014  . Status post total right knee replacement 06/15/2014  . Sleep disorder 04/25/2014  . Inguinal hernia 12/21/2013  . Diabetes 10/11/2013  . Routine general medical examination at a health care facility 10/10/2013  . Screening for prostate cancer 10/10/2013  . GOUT 04/26/2008  . RECTAL BLEEDING 04/26/2008  . CHEST PAIN UNSPECIFIED 04/25/2007  . HYPERCHOLESTEROLEMIA 01/26/2007  . UNSPECIFIED CHRONIC ISCHEMIC HEART DISEASE 01/26/2007  . CERVICAL RADICULOPATHY, RIGHT 01/26/2007  . INSOMNIA 01/26/2007    Darrel Hoover PT 07/16/2014, 11:44 AM  [facility] [address] [place], [place], [address] Phone: [phone]   Fax:  [phone]

## 2014-07-19 ENCOUNTER — Ambulatory Visit
Admission: RE | Admit: 2014-07-19 | Discharge: 2014-07-19 | Disposition: A | Discharge status: Home / Self Care | Payer: BLUE CROSS/BLUE SHIELD | Source: Ambulatory Visit | Attending: Orthopaedic Surgery | Admitting: Orthopaedic Surgery

## 2014-07-19 DIAGNOSIS — M25511 Pain in right shoulder: Secondary | ICD-10-CM

## 2014-07-20 ENCOUNTER — Ambulatory Visit: Payer: BLUE CROSS/BLUE SHIELD | Attending: Orthopaedic Surgery

## 2014-07-20 DIAGNOSIS — M25561 Pain in right knee: Secondary | ICD-10-CM | POA: Diagnosis not present

## 2014-07-20 DIAGNOSIS — Z723 Lack of physical exercise: Secondary | ICD-10-CM | POA: Diagnosis present

## 2014-07-20 DIAGNOSIS — M25511 Pain in right shoulder: Secondary | ICD-10-CM | POA: Diagnosis present

## 2014-07-20 DIAGNOSIS — R262 Difficulty in walking, not elsewhere classified: Secondary | ICD-10-CM | POA: Diagnosis present

## 2014-07-20 DIAGNOSIS — R29898 Other symptoms and signs involving the musculoskeletal system: Secondary | ICD-10-CM | POA: Diagnosis present

## 2014-07-20 DIAGNOSIS — M25661 Stiffness of right knee, not elsewhere classified: Secondary | ICD-10-CM

## 2014-07-20 NOTE — Therapy (Signed)
[place], [place], [address] Phone: [phone]   Fax:  [phone]  Physical Therapy Treatment  Patient Details  Name: David Wall MRN: [id number] Date of Birth: 28-Dec-1955 Referring Provider:  Renato Shin, MD  Encounter Date: 07/20/2014      PT End of Session - 07/20/14 1226    Visit Number 5   Number of Visits 16   Date for PT Re-Evaluation 08/27/14   PT Start Time 0845   PT Stop Time 0945   PT Time Calculation (min) 60 min   Activity Tolerance Patient tolerated treatment well   Behavior During Therapy [facility] for tasks assessed/performed      Past Medical History  Diagnosis Date  . Gout   . Asthma     hx of in childhood  . Diabetes mellitus without complication   . GERD (gastroesophageal reflux disease)   . Arthritis   . H/O urinary frequency     as noted in H&P per Dr Loanne Drilling 04/25/2014     Past Surgical History  Procedure Laterality Date  . Eye surgery      laser surgery bilat   . Upper gi endoscopy    . Total knee arthroplasty Right 06/15/2014    Procedure: RIGHT TOTAL KNEE ARTHROPLASTY;  Surgeon: Mcarthur Rossetti, MD;  Location: [facility];  Service: Orthopedics;  Laterality: Right;    There were no vitals filed for this visit.  Visit Diagnosis:  Knee pain, right  Decreased ROM of right knee  Weakness of right leg  Difficulty walking      Subjective Assessment - 07/20/14 0852    Subjective Doing great with knee   Currently in Pain? Yes   Pain Score 2    Pain Location Knee   Pain Orientation Right   Pain Descriptors / Indicators Discomfort   Pain Type Surgical pain   Pain Onset More than a month ago   Pain Frequency Intermittent   Aggravating Factors  bending knee   Pain Relieving Factors ice elevation , meds   Multiple Pain Sites Yes  RT shoulder            [facility] PT Assessment - 07/20/14 0911    AROM   Right Knee Extension 20   Right Knee Flexion 105   passive 110                     [facility] Adult PT Treatment/Exercise - 07/20/14 0854    Knee/Hip Exercises: Aerobic   Stationary Bike no resistance 5 min for stretching   Knee/Hip Exercises: Seated   Long Arc Quad Strengthening;Right;1 set   [employer] Weight 8 lbs.   Long Arc [employer] Limitations 30 reps   Vasopneumatic   Number Minutes Vasopneumatic  15 minutes   Vasopnuematic Location  Knee   Vasopneumatic Pressure Medium   Vasopneumatic Temperature  35   Manual Therapy   Manual Therapy Soft tissue mobilization;Joint mobilization   Manual therapy comments PROm flexin and extension with oscillations   Joint Mobilization patella and PA tibial glides 30 x 4    Soft tissue mobilization with rockblade to patella tendon and atround patella and with retrograde glides of the tool.      Step ups 8 inches 2 x 15, Rt leg , heel cord stretch off step. 30 sec x 3, TKE into ball with SAQ  TKE into ball standing at counter. x30  PT Short Term Goals - 07/16/14 1142    PT SHORT TERM GOAL #1   Title Pt will be independent with initial HEP knee   Status Achieved   PT SHORT TERM GOAL #2   Title Report pain decrease with negotiating steps decreased by 50%using SPC   Status Achieved   PT SHORT TERM GOAL #3   Title Demonstrate and verbalize understanding of condition management including RICE, positioning, use of AD , HEP   Status Achieved   PT SHORT TERM GOAL #4   Title Pt to be evaluated by MD [facility] by next visit for R shoulder. eval and make goals for Right shoulder   Status On-going           PT Long Term Goals - 07/02/14 1434    PT LONG TERM GOAL #1   Title Pt will be independent with advanced HEP   Time 8   Period Weeks   Status New   PT LONG TERM GOAL #2   Title Pain will decreased to 0/10 with all functional activities   Time 8   Period Weeks   Status New   PT LONG TERM GOAL #3   Title Right knee AROM will be +5 ext to at least +115 for  improved mobility to ride in car and negotiate steps   Time 8   Period Weeks   Status New   PT LONG TERM GOAL #4   Title FOTO will improve from 47% limitation to 30% limitatiaon indicating improved functional mobililty   Time 8   Period Weeks   Status New   PT LONG TERM GOAL #5   Title Pt will improve strength symmetrically inLE's to begin home walking program and return to exercise in community   Time 8   Period Weeks   Status New   Additional Long Term Goals   Additional Long Term Goals Yes               Plan - 07/20/14 1227    Clinical Impression Statement He tolerated treament but pushing flexion was painful. Range improved to 110 degrees post.. He continues to need flexion and extension range . Functioal strength improveing with step ups.    PT Next Visit Plan Soft tissue mobilization,  , progress knee AROM, manual, vaso as needed, strengthening   Consulted and Agree with Plan of Care Patient        Problem List Patient Active Problem List   Diagnosis Date Noted  . Osteoarthritis of right knee 06/15/2014  . Status post total right knee replacement 06/15/2014  . Sleep disorder 04/25/2014  . Inguinal hernia 12/21/2013  . Diabetes 10/11/2013  . Routine general medical examination at a health care facility 10/10/2013  . Screening for prostate cancer 10/10/2013  . GOUT 04/26/2008  . RECTAL BLEEDING 04/26/2008  . CHEST PAIN UNSPECIFIED 04/25/2007  . HYPERCHOLESTEROLEMIA 01/26/2007  . UNSPECIFIED CHRONIC ISCHEMIC HEART DISEASE 01/26/2007  . CERVICAL RADICULOPATHY, RIGHT 01/26/2007  . INSOMNIA 01/26/2007    Darrel Hoover PT 07/20/2014, 12:30 PM  [facility] [address] [place], [place], [address] Phone: [phone]   Fax:  [phone]

## 2014-07-24 ENCOUNTER — Ambulatory Visit: Payer: BLUE CROSS/BLUE SHIELD

## 2014-07-24 DIAGNOSIS — M25561 Pain in right knee: Secondary | ICD-10-CM | POA: Diagnosis not present

## 2014-07-24 DIAGNOSIS — M25661 Stiffness of right knee, not elsewhere classified: Secondary | ICD-10-CM

## 2014-07-24 DIAGNOSIS — R29898 Other symptoms and signs involving the musculoskeletal system: Secondary | ICD-10-CM

## 2014-07-24 NOTE — Therapy (Signed)
[place], [place], [address] Phone: [phone]   Fax:  [phone]  Physical Therapy Treatment  Patient Details  Name: David Wall MRN: [id number] Date of Birth: [birth year] Referring Provider:  Renato Shin, MD  Encounter Date: 07/24/2014      PT End of Session - 07/24/14 1236    Visit Number 6   Number of Visits 16   Date for PT Re-Evaluation 08/27/14   PT Start Time 2703   PT Stop Time 1250   PT Time Calculation (min) 65 min   Activity Tolerance Patient tolerated treatment well   Behavior During Therapy [facility] for tasks assessed/performed      Past Medical History  Diagnosis Date  . Gout   . Asthma     hx of in childhood  . Diabetes mellitus without complication   . GERD (gastroesophageal reflux disease)   . Arthritis   . H/O urinary frequency     as noted in H&P per Dr Loanne Drilling 04/25/2014     Past Surgical History  Procedure Laterality Date  . Eye surgery      laser surgery bilat   . Upper gi endoscopy    . Total knee arthroplasty Right 06/15/2014    Procedure: RIGHT TOTAL KNEE ARTHROPLASTY;  Surgeon: Mcarthur Rossetti, MD;  Location: [facility];  Service: Orthopedics;  Laterality: Right;    There were no vitals filed for this visit.  Visit Diagnosis:  Knee pain, right  Decreased ROM of right knee  Weakness of right leg      Subjective Assessment - 07/24/14 1159    Subjective Knee feels alright . 6 hour car ride not comfortable but Ok  now   Currently in Pain? Yes   Pain Score 2    Multiple Pain Sites No                         [facility] Adult PT Treatment/Exercise - 07/24/14 1200    Knee/Hip Exercises: Aerobic   Stationary Bike L1 8 min  full range   Knee/Hip Exercises: Standing   Forward Step Up Right;Hand Hold: 1  x25 with reach forward LT leg   Other Standing Knee Exercises Side steps with red band around distal thighs   Knee/Hip Exercises: Seated   Long  Arc Quad Strengthening;1 set;Right   Long Arc Quad Weight 8 lbs.   Long Arc Quad Limitations 30 reps 5 sec hold minimum   Knee/Hip Exercises: Supine   Quad Sets AROM;Right;3 sets;15 reps   Short Arc [employer] Strengthening;Right;1 set;20 reps   Short [employer] Limitations done in conjunction with qs/.push into ball 5 sec   Knee Extension AROM;Right;1 set   Vasopneumatic   Number Minutes Vasopneumatic  15 minutes   Vasopnuematic Location  Knee   Vasopneumatic Pressure Medium   Vasopneumatic Temperature  35   Manual Therapy   Manual Therapy Passive ROM   Joint Mobilization patella and PA tibial glides 30 x 4    Passive ROM extension stretching                  PT Short Term Goals - 07/16/14 1142    PT SHORT TERM GOAL #1   Title Pt will be independent with initial HEP knee   Status Achieved   PT SHORT TERM GOAL #2   Title Report pain decrease with negotiating steps decreased by 50%using SPC   Status Achieved  PT SHORT TERM GOAL #3   Title Demonstrate and verbalize understanding of condition management including RICE, positioning, use of AD , HEP   Status Achieved   PT SHORT TERM GOAL #4   Title Pt to be evaluated by MD [facility] by next visit for R shoulder. eval and make goals for Right shoulder   Status On-going           PT Long Term Goals - 07/02/14 1434    PT LONG TERM GOAL #1   Title Pt will be independent with advanced HEP   Time 8   Period Weeks   Status New   PT LONG TERM GOAL #2   Title Pain will decreased to 0/10 with all functional activities   Time 8   Period Weeks   Status New   PT LONG TERM GOAL #3   Title Right knee AROM will be +5 ext to at least +115 for improved mobility to ride in car and negotiate steps   Time 8   Period Weeks   Status New   PT LONG TERM GOAL #4   Title FOTO will improve from 47% limitation to 30% limitatiaon indicating improved functional mobililty   Time 8   Period Weeks   Status New   PT LONG TERM GOAL  #5   Title Pt will improve strength symmetrically inLE's to begin home walking program and return to exercise in community   Time 8   Period Weeks   Status New   Additional Long Term Goals   Additional Long Term Goals Yes               Plan - 07/24/14 1237    Clinical Impression Statement Pain with end range cont decreased range , functional mobility cont to improve and pain minimal except with stretching   PT Next Visit Plan Soft tissue mobilization,  , progress knee AROM, manual, vaso as needed, strengthening   Consulted and Agree with Plan of Care Patient        Problem List Patient Active Problem List   Diagnosis Date Noted  . Osteoarthritis of right knee 06/15/2014  . Status post total right knee replacement 06/15/2014  . Sleep disorder 04/25/2014  . Inguinal hernia 12/21/2013  . Diabetes 10/11/2013  . Routine general medical examination at a health care facility 10/10/2013  . Screening for prostate cancer 10/10/2013  . GOUT 04/26/2008  . RECTAL BLEEDING 04/26/2008  . CHEST PAIN UNSPECIFIED 04/25/2007  . HYPERCHOLESTEROLEMIA 01/26/2007  . UNSPECIFIED CHRONIC ISCHEMIC HEART DISEASE 01/26/2007  . CERVICAL RADICULOPATHY, RIGHT 01/26/2007  . INSOMNIA 01/26/2007    Darrel Hoover PT 07/24/2014, 1:47 PM  [facility] [address] [place], [place], [address] Phone: [phone]   Fax:  [phone]

## 2014-07-26 ENCOUNTER — Other Ambulatory Visit: Payer: Self-pay | Admitting: Physician Assistant

## 2014-07-26 DIAGNOSIS — M25511 Pain in right shoulder: Secondary | ICD-10-CM

## 2014-07-27 ENCOUNTER — Ambulatory Visit: Payer: BLUE CROSS/BLUE SHIELD

## 2014-07-27 DIAGNOSIS — M25561 Pain in right knee: Secondary | ICD-10-CM

## 2014-07-27 DIAGNOSIS — R29898 Other symptoms and signs involving the musculoskeletal system: Secondary | ICD-10-CM

## 2014-07-27 DIAGNOSIS — M25661 Stiffness of right knee, not elsewhere classified: Secondary | ICD-10-CM

## 2014-07-27 NOTE — Therapy (Signed)
[place], [place], [address] Phone: [phone]   Fax:  [phone]  Physical Therapy Treatment  Patient Details  Name: David Wall MRN: [id number] Date of Birth: 01/07/1956 Referring Provider:  Renato Shin, MD  Encounter Date: 07/27/2014      PT End of Session - 07/27/14 0918    Visit Number 7   Number of Visits 16   Date for PT Re-Evaluation 08/27/14   PT Start Time 0800   PT Stop Time 0910   PT Time Calculation (min) 70 min   Activity Tolerance Patient tolerated treatment well   Behavior During Therapy [facility] for tasks assessed/performed      Past Medical History  Diagnosis Date  . Gout   . Asthma     hx of in childhood  . Diabetes mellitus without complication   . GERD (gastroesophageal reflux disease)   . Arthritis   . H/O urinary frequency     as noted in H&P per Dr Loanne Drilling 04/25/2014     Past Surgical History  Procedure Laterality Date  . Eye surgery      laser surgery bilat   . Upper gi endoscopy    . Total knee arthroplasty Right 06/15/2014    Procedure: RIGHT TOTAL KNEE ARTHROPLASTY;  Surgeon: Mcarthur Rossetti, MD;  Location: [facility];  Service: Orthopedics;  Laterality: Right;    There were no vitals filed for this visit.  Visit Diagnosis:  Knee pain, right  Decreased ROM of right knee  Weakness of right leg      Subjective Assessment - 07/27/14 0804    Subjective A little sore but I expect that.    Currently in Pain? Yes   Pain Score 2    Pain Orientation Right   Pain Descriptors / Indicators Sore   Pain Type Surgical pain   Pain Onset More than a month ago   Pain Frequency Intermittent   Aggravating Factors  stretching   Pain Relieving Factors ice, elevation, meds   Multiple Pain Sites No                         [facility] Adult PT Treatment/Exercise - 07/27/14 0805    Knee/Hip Exercises: Aerobic   Stationary Bike L1 8 min  full range   Knee/Hip Exercises: Standing   Heel Raises Both;Right;1 set;10 reps   Lateral Step Up Right;1 set;15 reps;Hand Hold: 1;Step Height: 8"   Forward Step Up Right;1 set;15 reps;Hand Hold: 1;Step Height: 8"   Other Standing Knee Exercises stand with LT foot on seat  of the lat bar pull with rotation with blue rebounder ball   Knee/Hip Exercises: Seated   Long Arc Quad Strengthening;Right;1 set   [employer] Weight 10 lbs.   Long Arc Quad Limitations 40 reps 5 sec hold minimum   Vasopneumatic   Number Minutes Vasopneumatic  15 minutes   Vasopnuematic Location  Knee   Vasopneumatic Pressure Medium   Vasopneumatic Temperature  35   Manual Therapy   Joint Mobilization patella all directions.    Passive ROM flexion stretching  after STW with rockblade.   post stretcing glexion to 115 degrees                  PT Short Term Goals - 07/27/14 0920    PT SHORT TERM GOAL #1   Title Pt will be independent with initial HEP knee   Status Achieved  PT SHORT TERM GOAL #2   Title Report pain decrease with negotiating steps decreased by 50%using SPC   Status Achieved   PT SHORT TERM GOAL #3   Title Demonstrate and verbalize understanding of condition management including RICE, positioning, use of AD , HEP   Status Achieved   PT SHORT TERM GOAL #4   Title Pt to be evaluated by MD [facility] by next visit for R shoulder. eval and make goals for Right shoulder   Status On-going           PT Long Term Goals - 07/27/14 1638    PT LONG TERM GOAL #1   Title Pt will be independent with advanced HEP   Status On-going   PT LONG TERM GOAL #2   Title Pain will decreased to 0/10 with all functional activities   Status On-going   PT LONG TERM GOAL #3   Title Right knee AROM will be +5 ext to at least +115 for improved mobility to ride in car and negotiate steps   Status On-going   PT LONG TERM GOAL #4   Title FOTO will improve from 47% limitation to 30% limitatiaon indicating improved  functional mobililty   Status Unable to assess   PT LONG TERM GOAL #5   Title Pt will improve strength symmetrically inLE's to begin home walking program and return to exercise in community   Status On-going               Plan - 07/27/14 4665    Clinical Impression Statement No significant changes . More sore today but did not pull back on dffort with exercises and tolerated streeching without complaint. Continue to push range   and strength   PT Next Visit Plan Soft tissue mobilization,  , progress knee AROM, manual, vaso as needed, strengthening   PT Home Exercise Plan knee extension exercises, prone with wt, hamstring stretch and quad set with heel elevated with towel.   Consulted and Agree with Plan of Care Patient        Problem List Patient Active Problem List   Diagnosis Date Noted  . Osteoarthritis of right knee 06/15/2014  . Status post total right knee replacement 06/15/2014  . Sleep disorder 04/25/2014  . Inguinal hernia 12/21/2013  . Diabetes 10/11/2013  . Routine general medical examination at a health care facility 10/10/2013  . Screening for prostate cancer 10/10/2013  . GOUT 04/26/2008  . RECTAL BLEEDING 04/26/2008  . CHEST PAIN UNSPECIFIED 04/25/2007  . HYPERCHOLESTEROLEMIA 01/26/2007  . UNSPECIFIED CHRONIC ISCHEMIC HEART DISEASE 01/26/2007  . CERVICAL RADICULOPATHY, RIGHT 01/26/2007  . INSOMNIA 01/26/2007    Darrel Hoover PT 07/27/2014, 9:22 AM  [facility] [address] [place], [place], [address] Phone: [phone]   Fax:  [phone]

## 2014-07-30 ENCOUNTER — Ambulatory Visit: Payer: BLUE CROSS/BLUE SHIELD | Admitting: Physical Therapy

## 2014-07-30 DIAGNOSIS — M25561 Pain in right knee: Secondary | ICD-10-CM | POA: Diagnosis not present

## 2014-07-30 DIAGNOSIS — M25511 Pain in right shoulder: Secondary | ICD-10-CM

## 2014-07-30 DIAGNOSIS — R29898 Other symptoms and signs involving the musculoskeletal system: Secondary | ICD-10-CM

## 2014-07-30 DIAGNOSIS — Z723 Lack of physical exercise: Secondary | ICD-10-CM

## 2014-07-30 DIAGNOSIS — M25661 Stiffness of right knee, not elsewhere classified: Secondary | ICD-10-CM

## 2014-07-30 DIAGNOSIS — R262 Difficulty in walking, not elsewhere classified: Secondary | ICD-10-CM

## 2014-07-30 NOTE — Therapy (Signed)
[place], [place], [address] Phone: [phone]   Fax:  [phone]  Physical Therapy Treatment  Patient Details  Name: David Wall MRN: [id number] Date of Birth: 09-22-1955 Referring Provider:  Mcarthur Rossetti*  Encounter Date: 07/30/2014      PT End of Session - 07/30/14 1331    Visit Number 8   Number of Visits 16   Date for PT Re-Evaluation 08/27/14   Authorization Type [employer]   Authorization Time Period 08/27/14   PT Start Time 1332   PT Stop Time 1430   PT Time Calculation (min) 58 min   Activity Tolerance Patient tolerated treatment well   Behavior During Therapy [facility] for tasks assessed/performed      Past Medical History  Diagnosis Date  . Gout   . Asthma     hx of in childhood  . Diabetes mellitus without complication   . GERD (gastroesophageal reflux disease)   . Arthritis   . H/O urinary frequency     as noted in H&P per Dr Loanne Drilling 04/25/2014     Past Surgical History  Procedure Laterality Date  . Eye surgery      laser surgery bilat   . Upper gi endoscopy    . Total knee arthroplasty Right 06/15/2014    Procedure: RIGHT TOTAL KNEE ARTHROPLASTY;  Surgeon: Mcarthur Rossetti, MD;  Location: [facility];  Service: Orthopedics;  Laterality: Right;    There were no vitals filed for this visit.  Visit Diagnosis:  Knee pain, right  Decreased ROM of right knee  Weakness of right leg  Difficulty walking  Shoulder pain, right  Inactivity      Subjective Assessment - 07/30/14 1330    Subjective sore but that's it.    How long can you stand comfortably? 15 min   How long can you walk comfortably? 15 min   Patient Stated Goals Walk without a cane,, negotiating steps  without a cane   Currently in Pain? No/denies   Multiple Pain Sites No            [facility] PT Assessment - 07/30/14 0001    AROM   Right Knee Extension 20  20 prior to mobs, 15 after STW and mobs    Right Knee Flexion 110                     [facility] Adult PT Treatment/Exercise - 07/30/14 0001    Knee/Hip Exercises: Aerobic   Stationary Bike L4 X 69min   Knee/Hip Exercises: Supine   Other Supine Knee/Hip Exercises lie on floor with legs up wall for increasing extension and edema   Modalities   Modalities Vasopneumatic   Vasopneumatic   Number Minutes Vasopneumatic  15 minutes   Vasopnuematic Location  Knee   Vasopneumatic Pressure Medium   Vasopneumatic Temperature  35   Manual Therapy   Manual Therapy Passive ROM   Manual therapy comments PROM flex/extension with oscillations   Joint Mobilization patella and AP tibial glides 30X5   Soft tissue mobilization Rt hams, quads, calf    Passive ROM extension and flexion stretching                  PT Short Term Goals - 07/27/14 0920    PT SHORT TERM GOAL #1   Title Pt will be independent with initial HEP knee   Status Achieved   PT SHORT TERM GOAL #2   Title  Report pain decrease with negotiating steps decreased by 50%using SPC   Status Achieved   PT SHORT TERM GOAL #3   Title Demonstrate and verbalize understanding of condition management including RICE, positioning, use of AD , HEP   Status Achieved   PT SHORT TERM GOAL #4   Title Pt to be evaluated by MD [facility] by next visit for R shoulder. eval and make goals for Right shoulder   Status On-going           PT Long Term Goals - 07/27/14 0921    PT LONG TERM GOAL #1   Title Pt will be independent with advanced HEP   Status On-going   PT LONG TERM GOAL #2   Title Pain will decreased to 0/10 with all functional activities   Status On-going   PT LONG TERM GOAL #3   Title Right knee AROM will be +5 ext to at least +115 for improved mobility to ride in car and negotiate steps   Status On-going   PT LONG TERM GOAL #4   Title FOTO will improve from 47% limitation to 30% limitatiaon indicating improved functional mobililty   Status Unable to  assess   PT LONG TERM GOAL #5   Title Pt will improve strength symmetrically inLE's to begin home walking program and return to exercise in community   Status On-going               Plan - 07/30/14 1412    Clinical Impression Statement Pt gained 5 degrees for extension today after mobilization, STW. He will add in Leg up wall for HEP.    Pt will benefit from skilled therapeutic intervention in order to improve on the following deficits Abnormal gait;Decreased activity tolerance;Decreased balance;Decreased endurance;Decreased range of motion;Decreased mobility;Decreased strength;Decreased scar mobility;Difficulty walking;Increased fascial restricitons;Increased edema;Pain;Postural dysfunction;Improper body mechanics;Impaired flexibility   Rehab Potential Excellent   PT Frequency 2x / week   PT Duration 8 weeks   PT Next Visit Plan Soft tissue mobilization,  , progress knee AROM, manual, vaso as needed, strengthening   PT Home Exercise Plan knee extension exercises, prone with wt, hamstring stretch and quad set with heel elevated with towel.   Consulted and Agree with Plan of Care Patient        Problem List Patient Active Problem List   Diagnosis Date Noted  . Osteoarthritis of right knee 06/15/2014  . Status post total right knee replacement 06/15/2014  . Sleep disorder 04/25/2014  . Inguinal hernia 12/21/2013  . Diabetes 10/11/2013  . Routine general medical examination at a health care facility 10/10/2013  . Screening for prostate cancer 10/10/2013  . GOUT 04/26/2008  . RECTAL BLEEDING 04/26/2008  . CHEST PAIN UNSPECIFIED 04/25/2007  . HYPERCHOLESTEROLEMIA 01/26/2007  . UNSPECIFIED CHRONIC ISCHEMIC HEART DISEASE 01/26/2007  . CERVICAL RADICULOPATHY, RIGHT 01/26/2007  . INSOMNIA 01/26/2007     07/30/2014, 2:16 PM  [place], [place], [address] Phone: [phone]   Fax:   [phone]

## 2014-08-04 ENCOUNTER — Other Ambulatory Visit: Payer: BLUE CROSS/BLUE SHIELD

## 2014-08-08 ENCOUNTER — Ambulatory Visit: Payer: BLUE CROSS/BLUE SHIELD

## 2014-08-14 ENCOUNTER — Encounter: Payer: BLUE CROSS/BLUE SHIELD | Admitting: Physical Therapy

## 2014-08-16 ENCOUNTER — Ambulatory Visit: Payer: BLUE CROSS/BLUE SHIELD | Admitting: Physical Therapy

## 2014-08-16 DIAGNOSIS — Z723 Lack of physical exercise: Secondary | ICD-10-CM

## 2014-08-16 DIAGNOSIS — M25661 Stiffness of right knee, not elsewhere classified: Secondary | ICD-10-CM

## 2014-08-16 DIAGNOSIS — R29898 Other symptoms and signs involving the musculoskeletal system: Secondary | ICD-10-CM

## 2014-08-16 DIAGNOSIS — M25511 Pain in right shoulder: Secondary | ICD-10-CM

## 2014-08-16 DIAGNOSIS — R262 Difficulty in walking, not elsewhere classified: Secondary | ICD-10-CM

## 2014-08-16 DIAGNOSIS — M25561 Pain in right knee: Secondary | ICD-10-CM | POA: Diagnosis not present

## 2014-08-16 NOTE — Therapy (Signed)
[place], [place], [address] Phone: [phone]   Fax:  [phone]  Physical Therapy Treatment  Patient Details  Name: David Wall MRN: [id number] Date of Birth: 02-11-1955 Referring Provider:  Mcarthur Rossetti*  Encounter Date: 08/16/2014      PT End of Session - 08/16/14 1543    Visit Number 9   Number of Visits 16   Date for PT Re-Evaluation 08/27/14   PT Start Time 0301   PT Stop Time 0357   PT Time Calculation (min) 56 min      Past Medical History  Diagnosis Date  . Gout   . Asthma     hx of in childhood  . Diabetes mellitus without complication   . GERD (gastroesophageal reflux disease)   . Arthritis   . H/O urinary frequency     as noted in H&P per Dr Loanne Drilling 04/25/2014     Past Surgical History  Procedure Laterality Date  . Eye surgery      laser surgery bilat   . Upper gi endoscopy    . Total knee arthroplasty Right 06/15/2014    Procedure: RIGHT TOTAL KNEE ARTHROPLASTY;  Surgeon: Mcarthur Rossetti, MD;  Location: [facility];  Service: Orthopedics;  Laterality: Right;    There were no vitals filed for this visit.  Visit Diagnosis:  Knee pain, right  Decreased ROM of right knee  Weakness of right leg  Difficulty walking  Shoulder pain, right  Inactivity      Subjective Assessment - 08/16/14 1555    Currently in Pain? No/denies                         [facility] Adult PT Treatment/Exercise - 08/16/14 0001    Knee/Hip Exercises: Aerobic   Stationary Bike L4 X 83min   Knee/Hip Exercises: Machines for Strengthening   Cybex Knee Extension 1 plate x 15 focus on eccentric control   Cybex Knee Flexion 3 plates bilater x 20   Knee/Hip Exercises: Standing   Other Standing Knee Exercises Terminal knee extension with blue band 3 way x 10 each   Knee/Hip Exercises: Supine   Terminal Knee Extension Right;20 reps   Terminal Knee Extension Limitations with  heel prop   Patellar Mobs all planes   Knee/Hip Exercises: Prone   Prone Knee Hang 2 minutes;Weights   Prone Knee Hang Weights (lbs) 5   Vasopneumatic   Number Minutes Vasopneumatic  15 minutes   Vasopnuematic Location  Knee   Vasopneumatic Pressure Medium   Vasopneumatic Temperature  35   Manual Therapy   Joint Mobilization patella and AP tibial glides 30X5                  PT Short Term Goals - 07/27/14 0920    PT SHORT TERM GOAL #1   Title Pt will be independent with initial HEP knee   Status Achieved   PT SHORT TERM GOAL #2   Title Report pain decrease with negotiating steps decreased by 50%using SPC   Status Achieved   PT SHORT TERM GOAL #3   Title Demonstrate and verbalize understanding of condition management including RICE, positioning, use of AD , HEP   Status Achieved   PT SHORT TERM GOAL #4   Title Pt to be evaluated by MD [facility] by next visit for R shoulder. eval and make goals for Right shoulder   Status On-going  PT Long Term Goals - 07/27/14 5852    PT LONG TERM GOAL #1   Title Pt will be independent with advanced HEP   Status On-going   PT LONG TERM GOAL #2   Title Pain will decreased to 0/10 with all functional activities   Status On-going   PT LONG TERM GOAL #3   Title Right knee AROM will be +5 ext to at least +115 for improved mobility to ride in car and negotiate steps   Status On-going   PT LONG TERM GOAL #4   Title FOTO will improve from 47% limitation to 30% limitatiaon indicating improved functional mobililty   Status Unable to assess   PT LONG TERM GOAL #5   Title Pt will improve strength symmetrically inLE's to begin home walking program and return to exercise in community   Status On-going               Plan - 08/16/14 1553    Clinical Impression Statement Pt instructed in correct technique/position for prone knee hangs for HEP and 2 minute hold time. Pt instructed in standing TKE with blue band for HEP  as well. ROM 155 flexion passively and -10 passively for extension.    PT Next Visit Plan Soft tissue mobilization,  , progress knee AROM, manual, vaso as needed, strengthening FOTO        Problem List Patient Active Problem List   Diagnosis Date Noted  . Osteoarthritis of right knee 06/15/2014  . Status post total right knee replacement 06/15/2014  . Sleep disorder 04/25/2014  . Inguinal hernia 12/21/2013  . Diabetes 10/11/2013  . Routine general medical examination at a health care facility 10/10/2013  . Screening for prostate cancer 10/10/2013  . GOUT 04/26/2008  . RECTAL BLEEDING 04/26/2008  . CHEST PAIN UNSPECIFIED 04/25/2007  . HYPERCHOLESTEROLEMIA 01/26/2007  . UNSPECIFIED CHRONIC ISCHEMIC HEART DISEASE 01/26/2007  . CERVICAL RADICULOPATHY, RIGHT 01/26/2007  . INSOMNIA 01/26/2007    Dorene Ar, PTA 08/16/2014, 4:07 PM  [place], [place], [address] Phone: [phone]   Fax:  [phone]

## 2014-08-20 ENCOUNTER — Ambulatory Visit: Payer: BLUE CROSS/BLUE SHIELD | Attending: Orthopaedic Surgery

## 2014-08-20 DIAGNOSIS — M25561 Pain in right knee: Secondary | ICD-10-CM | POA: Insufficient documentation

## 2014-08-20 DIAGNOSIS — R29898 Other symptoms and signs involving the musculoskeletal system: Secondary | ICD-10-CM | POA: Insufficient documentation

## 2014-08-20 DIAGNOSIS — R262 Difficulty in walking, not elsewhere classified: Secondary | ICD-10-CM

## 2014-08-20 DIAGNOSIS — M25661 Stiffness of right knee, not elsewhere classified: Secondary | ICD-10-CM

## 2014-08-20 NOTE — Therapy (Signed)
[place], [place], [address] Phone: [phone]   Fax:  [phone]  Physical Therapy Treatment  Patient Details  Name: David Wall MRN: [id number] Date of Birth: 03-19-1955 Referring Provider:  Renato Shin, MD  Encounter Date: 08/20/2014      PT End of Session - 08/20/14 1017    Visit Number 10   Number of Visits 16   Date for PT Re-Evaluation 08/27/14   PT Start Time 0930   PT Stop Time 1030   PT Time Calculation (min) 60 min   Activity Tolerance Patient tolerated treatment well   Behavior During Therapy [facility] for tasks assessed/performed      Past Medical History  Diagnosis Date  . Gout   . Asthma     hx of in childhood  . Diabetes mellitus without complication   . GERD (gastroesophageal reflux disease)   . Arthritis   . H/O urinary frequency     as noted in H&P per Dr Loanne Drilling 04/25/2014     Past Surgical History  Procedure Laterality Date  . Eye surgery      laser surgery bilat   . Upper gi endoscopy    . Total knee arthroplasty Right 06/15/2014    Procedure: RIGHT TOTAL KNEE ARTHROPLASTY;  Surgeon: Mcarthur Rossetti, MD;  Location: [facility];  Service: Orthopedics;  Laterality: Right;    There were no vitals filed for this visit.  Visit Diagnosis:  Knee pain, right  Decreased ROM of right knee  Difficulty walking  Weakness of right leg      Subjective Assessment - 08/20/14 0952    Subjective Using much less pain meds. Take now and then.    Currently in Pain? No/denies            [facility] PT Assessment - 08/20/14 0954    AROM   Right Knee Extension 14  postures on mat at 10 degrees   Right Knee Flexion 113                     [facility] Adult PT Treatment/Exercise - 08/20/14 0953    Knee/Hip Exercises: Aerobic   Stationary Bike L3 8 min   Knee/Hip Exercises: Machines for Strengthening   Cybex Knee Extension 2 plates 3 sets of 10   Cybex Knee Flexion 3  plates bilater x 30   Knee/Hip Exercises: Supine   Short Arc Quad Sets Strengthening;Right;1 set;20 reps   Short Arc Quad Sets Limitations cued to press into bolster   Knee/Hip Exercises: Prone   Other Prone Exercises knee extension with 2 lb wt with towel above knee for prolonged stretch, 5 min   Manual Therapy   Joint Mobilization patella and AP tibial glides 30X5   Passive ROM extension and flexion stretching     Cold pack at end 12 min             PT Short Term Goals - 08/20/14 1020    PT SHORT TERM GOAL #1   Title Pt will be independent with initial HEP knee   Status Achieved   PT SHORT TERM GOAL #2   Title Report pain decrease with negotiating steps decreased by 50%using SPC   Status Achieved   PT SHORT TERM GOAL #3   Title Demonstrate and verbalize understanding of condition management including RICE, positioning, use of AD , HEP   Status Achieved           PT Long  Term Goals - 08/20/14 1021    PT LONG TERM GOAL #1   Title Pt will be independent with advanced HEP   Status On-going   PT LONG TERM GOAL #2   Title Pain will decreased to 0/10 with all functional activities   Baseline 1-2/10    Status On-going   PT LONG TERM GOAL #3   Title Right knee AROM will be +5 ext to at least +115 for improved mobility to ride in car and negotiate steps   Baseline -15 to 113 degrees   Status On-going   PT LONG TERM GOAL #5   Title Pt will improve strength symmetrically inLE's to begin home walking program and return to exercise in community   Status On-going               Plan - 08/20/14 1018    Clinical Impression Statement COntinue with stretching and strenghtening. Range continues to e limited Strength improved. PAin minimal if at all . Tolerates stretching well.    PT Next Visit Plan Soft tissue mobilization,  , progress knee AROM, manual, vaso as needed, strengthening FOTO   Consulted and Agree with Plan of Care Patient        Problem List Patient  Active Problem List   Diagnosis Date Noted  . Osteoarthritis of right knee 06/15/2014  . Status post total right knee replacement 06/15/2014  . Sleep disorder 04/25/2014  . Inguinal hernia 12/21/2013  . Diabetes 10/11/2013  . Routine general medical examination at a health care facility 10/10/2013  . Screening for prostate cancer 10/10/2013  . GOUT 04/26/2008  . RECTAL BLEEDING 04/26/2008  . CHEST PAIN UNSPECIFIED 04/25/2007  . HYPERCHOLESTEROLEMIA 01/26/2007  . UNSPECIFIED CHRONIC ISCHEMIC HEART DISEASE 01/26/2007  . CERVICAL RADICULOPATHY, RIGHT 01/26/2007  . INSOMNIA 01/26/2007    Darrel Hoover PT 08/20/2014, 10:30 AM  [facility] [address] [place], [place], [address] Phone: [phone]   Fax:  [phone]

## 2014-08-22 ENCOUNTER — Ambulatory Visit: Payer: BLUE CROSS/BLUE SHIELD

## 2014-08-22 DIAGNOSIS — M25561 Pain in right knee: Secondary | ICD-10-CM | POA: Diagnosis not present

## 2014-08-22 DIAGNOSIS — M25661 Stiffness of right knee, not elsewhere classified: Secondary | ICD-10-CM

## 2014-08-22 DIAGNOSIS — R29898 Other symptoms and signs involving the musculoskeletal system: Secondary | ICD-10-CM

## 2014-08-22 DIAGNOSIS — R262 Difficulty in walking, not elsewhere classified: Secondary | ICD-10-CM

## 2014-08-22 NOTE — Therapy (Signed)
[place], [place], [address] Phone: [phone]   Fax:  [phone]  Physical Therapy Treatment  Patient Details  Name: David Wall MRN: [id number] Date of Birth: July 12, 1955 Referring Provider:  Renato Shin, MD  Encounter Date: 08/22/2014      PT End of Session - 08/22/14 1018    Visit Number 11   Number of Visits 16   Date for PT Re-Evaluation 08/27/14   PT Start Time 0925   PT Stop Time 1030   PT Time Calculation (min) 65 min   Activity Tolerance Patient tolerated treatment well   Behavior During Therapy [facility] for tasks assessed/performed      Past Medical History  Diagnosis Date  . Gout   . Asthma     hx of in childhood  . Diabetes mellitus without complication   . GERD (gastroesophageal reflux disease)   . Arthritis   . H/O urinary frequency     as noted in H&P per Dr Loanne Drilling 04/25/2014     Past Surgical History  Procedure Laterality Date  . Eye surgery      laser surgery bilat   . Upper gi endoscopy    . Total knee arthroplasty Right 06/15/2014    Procedure: RIGHT TOTAL KNEE ARTHROPLASTY;  Surgeon: Mcarthur Rossetti, MD;  Location: [facility];  Service: Orthopedics;  Laterality: Right;    There were no vitals filed for this visit.  Visit Diagnosis:  Knee pain, right  Decreased ROM of right knee  Difficulty walking  Weakness of right leg      Subjective Assessment - 08/22/14 0930    Subjective No pain. feels good. Biggist limitation with bending /squatting. Fatigue  makes me not bend knee as much as needed. walking down steps straight now.    Currently in Pain? No/denies            [facility] PT Assessment - 08/22/14 0950    Assessment   Next MD Visit 09/05/14                     [facility] Adult PT Treatment/Exercise - 08/22/14 0932    Neuro Re-ed    Neuro Re-ed Details  Stand and ball toss with LT toe behind on floor and Rt stance. x 30 reps and single leg  stand  Lt 16 and RT 22 sec max today. walking line x 20 feet without loss of balance.    Knee/Hip Exercises: Aerobic   Stationary Bike L3 8 min   Knee/Hip Exercises: Machines for Strengthening   Cybex Leg Press 3 plates x 10 then 5 plates x 10 2 sets.   Knee/Hip Exercises: Standing   Knee Flexion Strengthening;Right;1 set  30 reps 5 pounds   Forward Step Up Right;1 set;20 reps;Hand Hold: 1;Step Height: 8"   Forward Step Up Limitations cued to use gluteals to assist quads   Knee/Hip Exercises: Seated   Long Arc Quad Strengthening;Right;1 set  30 reps 10 sec hold   Long Arc Quad Weight 8 lbs.   Knee/Hip Exercises: Supine   Quad Sets AROM;Right;3 sets;10 reps   Short Arc [employer] Strengthening;Right;15 reps;1 set   [employer] Limitations cued to press knee into 1/2 foam roller   Heel Slides Right;1 set;15 reps   Heel Slides Limitations terminal range with verbal and tactile cues.    Cryotherapy   Number Minutes Cryotherapy 12 Minutes   Cryotherapy Location Knee   Type of  Cryotherapy Ice pack   Manual Therapy   Passive ROM extension stretching and AP mobs femur on tibia grade 4 30-45 reps 3-4 sets  3 bouts      FOTO: 28% Mobility today          PT Education - 08/22/14 1017    Education provided Yes   Education Details Discussed long term swellling and range, function   Person(s) Educated Patient   Methods Explanation   Comprehension Verbalized understanding          PT Short Term Goals - 08/20/14 1020    PT SHORT TERM GOAL #1   Title Pt will be independent with initial HEP knee   Status Achieved   PT SHORT TERM GOAL #2   Title Report pain decrease with negotiating steps decreased by 50%using SPC   Status Achieved   PT SHORT TERM GOAL #3   Title Demonstrate and verbalize understanding of condition management including RICE, positioning, use of AD , HEP   Status Achieved           PT Long Term Goals - 08/20/14 1021    PT LONG TERM GOAL #1   Title  Pt will be independent with advanced HEP   Status On-going   PT LONG TERM GOAL #2   Title Pain will decreased to 0/10 with all functional activities   Baseline 1-2/10    Status On-going   PT LONG TERM GOAL #3   Title Right knee AROM will be +5 ext to at least +115 for improved mobility to ride in car and negotiate steps   Baseline -15 to 113 degrees   Status On-going   PT LONG TERM GOAL #5   Title Pt will improve strength symmetrically inLE's to begin home walking program and return to exercise in community   Status On-going               Plan - 08/22/14 1019    Clinical Impression Statement Balance and strength doing well and functional  knee range still limited . Easier fatigue noted by patient.  Continue to push range and strnegth.    PT Next Visit Plan Continue range , mobs, strengthening 9possibel hip hinge single and double leg with weight.   Cold as needed   Consulted and Agree with Plan of Care Patient        Problem List Patient Active Problem List   Diagnosis Date Noted  . Osteoarthritis of right knee 06/15/2014  . Status post total right knee replacement 06/15/2014  . Sleep disorder 04/25/2014  . Inguinal hernia 12/21/2013  . Diabetes 10/11/2013  . Routine general medical examination at a health care facility 10/10/2013  . Screening for prostate cancer 10/10/2013  . GOUT 04/26/2008  . RECTAL BLEEDING 04/26/2008  . CHEST PAIN UNSPECIFIED 04/25/2007  . HYPERCHOLESTEROLEMIA 01/26/2007  . UNSPECIFIED CHRONIC ISCHEMIC HEART DISEASE 01/26/2007  . CERVICAL RADICULOPATHY, RIGHT 01/26/2007  . INSOMNIA 01/26/2007    Darrel Hoover PT 08/22/2014, 10:22 AM  [facility] [address] [place], [place], [address] Phone: [phone]   Fax:  [phone]

## 2014-08-27 ENCOUNTER — Ambulatory Visit: Payer: BLUE CROSS/BLUE SHIELD

## 2014-08-27 DIAGNOSIS — R262 Difficulty in walking, not elsewhere classified: Secondary | ICD-10-CM

## 2014-08-27 DIAGNOSIS — M25561 Pain in right knee: Secondary | ICD-10-CM | POA: Diagnosis not present

## 2014-08-27 DIAGNOSIS — M25661 Stiffness of right knee, not elsewhere classified: Secondary | ICD-10-CM

## 2014-08-27 DIAGNOSIS — R29898 Other symptoms and signs involving the musculoskeletal system: Secondary | ICD-10-CM

## 2014-08-27 NOTE — Therapy (Signed)
[place], [place], [address] Phone: [phone]   Fax:  [phone]  Physical Therapy Treatment  Patient Details  Name: David Wall MRN: [id number] Date of Birth: August 05, 1955 Referring Provider:  Renato Shin, MD  Encounter Date: 08/27/2014      PT End of Session - 08/27/14 1018    Visit Number 12   Number of Visits 20   Date for PT Re-Evaluation 09/24/14   PT Start Time 0930   PT Stop Time 6759   PT Time Calculation (min) 45 min      Past Medical History  Diagnosis Date  . Gout   . Asthma     hx of in childhood  . Diabetes mellitus without complication   . GERD (gastroesophageal reflux disease)   . Arthritis   . H/O urinary frequency     as noted in H&P per Dr David Wall 04/25/2014     Past Surgical History  Procedure Laterality Date  . Eye surgery      laser surgery bilat   . Upper gi endoscopy    . Total knee arthroplasty Right 06/15/2014    Procedure: RIGHT TOTAL KNEE ARTHROPLASTY;  Surgeon: David Rossetti, MD;  Location: [facility];  Service: Orthopedics;  Laterality: Right;    There were no vitals filed for this visit.  Visit Diagnosis:  Knee pain, right  Decreased ROM of right knee  Difficulty walking  Weakness of right leg      Subjective Assessment - 08/27/14 0932    Subjective Feeling alright.   Currently in Pain? No/denies            [facility] PT Assessment - 08/27/14 0940    AROM   Right Knee Extension 12  Done as SAQ and 9 degrees post stretching   Right Knee Flexion 117  passive 120 degrees   Right Ankle Dorsiflexion 100                     [facility] Adult PT Treatment/Exercise - 08/27/14 0946    Knee/Hip Exercises: Stretches   Knee: Self-Stretch to increase Flexion Right;5 reps;30 seconds   Knee: Self-Stretch Limitations wit knee pulled toward chest.  and gentle overpressure to tolerance   Knee/Hip Exercises: Aerobic   Stationary Bike L3 8 min    Knee/Hip Exercises: Supine   Short Arc Quad Sets Strengthening;Right;15 reps;2 sets   Short Arc Quad Sets Limitations cued for maintaining pressure into ball.    Knee/Hip Exercises: Prone   Other Prone Exercises TKE on toes x 20 5 sec holds RT leg   Manual Therapy   Passive ROM extension stretching and AP mobs femur on tibia grade 4 30-45 reps 3-4 sets  3 bouts and 5 30-45 sec stretches prone  with manual pressur eto femur to push extension  and  flexion stretching with oscillations .                   PT Short Term Goals - 08/20/14 1020    PT SHORT TERM GOAL #1   Title Pt will be independent with initial HEP knee   Status Achieved   PT SHORT TERM GOAL #2   Title Report pain decrease with negotiating steps decreased by 50%using SPC   Status Achieved   PT SHORT TERM GOAL #3   Title Demonstrate and verbalize understanding of condition management including RICE, positioning, use of AD , HEP   Status Achieved  PT Long Term Goals - 08/27/14 1021    PT LONG TERM GOAL #1   Title Pt will be independent with advanced HEP   Status On-going   PT LONG TERM GOAL #2   Title Pain will decreased to 0/10 with all functional activities   Baseline 0/10 to 2/10 improving   Time 4   Period Weeks   Status On-going   PT LONG TERM GOAL #3   Title Right knee AROM will be +5 ext to at least +115 for improved mobility to ride in car and negotiate steps   Baseline -12 to 117 degrees   Time 4   Period Weeks   Status Partially Met   PT LONG TERM GOAL #4   Title FOTO will improve from 47% limitation to 30% limitatiaon indicating improved functional mobililty   Baseline 28% 08/22/14   Status Achieved   PT LONG TERM GOAL #5   Title Pt will improve strength symmetrically inLE's to begin home walking program and return to exercise in community   Status On-going               Plan - 08/27/14 1020    Clinical Impression Statement [name] is sowly progressing with rnge and  is working out at gym. Functionally he can do better with more ROM. Will pland on extensing 4 weeks if needed to get -5 degrees extension and 125 degrees flexion to improve gait and functional mobility.    PT Frequency 2x / week   PT Duration 4 weeks   PT Next Visit Plan Continue range , mobs, strengthening 9possibel hip hinge single and double leg with weight.   Cold as needed   PT Home Exercise Plan knee extension exercises, prone with wt, hamstring stretch and quad set with heel elevated with towel.   Consulted and Agree with Plan of Care Patient        Problem List Patient Active Problem List   Diagnosis Date Noted  . Osteoarthritis of right knee 06/15/2014  . Status post total right knee replacement 06/15/2014  . Sleep disorder 04/25/2014  . Inguinal hernia 12/21/2013  . Diabetes 10/11/2013  . Routine general medical examination at a health care facility 10/10/2013  . Screening for prostate cancer 10/10/2013  . GOUT 04/26/2008  . RECTAL BLEEDING 04/26/2008  . CHEST PAIN UNSPECIFIED 04/25/2007  . HYPERCHOLESTEROLEMIA 01/26/2007  . UNSPECIFIED CHRONIC ISCHEMIC HEART DISEASE 01/26/2007  . CERVICAL RADICULOPATHY, RIGHT 01/26/2007  . INSOMNIA 01/26/2007    David Wall PT 08/27/2014, 10:24 AM  [facility] [address] [place], [place], [address] Phone: [phone]   Fax:  [phone]

## 2014-08-29 ENCOUNTER — Ambulatory Visit: Payer: BLUE CROSS/BLUE SHIELD

## 2014-08-29 DIAGNOSIS — R29898 Other symptoms and signs involving the musculoskeletal system: Secondary | ICD-10-CM

## 2014-08-29 DIAGNOSIS — M25561 Pain in right knee: Secondary | ICD-10-CM | POA: Diagnosis not present

## 2014-08-29 DIAGNOSIS — R262 Difficulty in walking, not elsewhere classified: Secondary | ICD-10-CM

## 2014-08-29 DIAGNOSIS — M25661 Stiffness of right knee, not elsewhere classified: Secondary | ICD-10-CM

## 2014-08-29 NOTE — Therapy (Signed)
[place], [place], [address] Phone: [phone]   Fax:  [phone]  Physical Therapy Treatment  Patient Details  Name: David Wall MRN: [id number] Date of Birth: 02/25/1955 Referring Provider:  Renato Shin, MD  Encounter Date: 08/29/2014      PT End of Session - 08/29/14 1153    Visit Number 13   Number of Visits 20   Date for PT Re-Evaluation 09/24/14   PT Start Time 0930   PT Stop Time 9295   PT Time Calculation (min) 45 min   Activity Tolerance Patient tolerated treatment well   Behavior During Therapy [facility] for tasks assessed/performed      Past Medical History  Diagnosis Date  . Gout   . Asthma     hx of in childhood  . Diabetes mellitus without complication   . GERD (gastroesophageal reflux disease)   . Arthritis   . H/O urinary frequency     as noted in H&P per Dr David Wall 04/25/2014     Past Surgical History  Procedure Laterality Date  . Eye surgery      laser surgery bilat   . Upper gi endoscopy    . Total knee arthroplasty Right 06/15/2014    Procedure: RIGHT TOTAL KNEE ARTHROPLASTY;  Surgeon: David Rossetti, MD;  Location: [facility];  Service: Orthopedics;  Laterality: Right;    There were no vitals filed for this visit.  Visit Diagnosis:  Knee pain, right  Decreased ROM of right knee  Difficulty walking  Weakness of right leg      Subjective Assessment - 08/29/14 0942    Subjective Knee feeling good . no pain   Currently in Pain? No/denies            [facility] PT Assessment - 08/29/14 0954    AROM   Right Knee Flexion 120  Passive prone knee flexion 125 degrees                     [facility] Adult PT Treatment/Exercise - 08/29/14 0943    Knee/Hip Exercises: Aerobic   Stationary Bike L4 8 min   Knee/Hip Exercises: Standing   Lateral Step Up 20 reps  10 inches   Manual Therapy   Passive ROM extension stretching and AP mobs femur on tibia grade  4 30-45 reps 3-4 sets  3 bouts and 5 30-45 sec stretches prone  with manual pressure to femur to push extension  and  flexion stretching with oscillations prone lying.   Extension passive post stretching -5 degrees                   PT Short Term Goals - 08/20/14 1020    PT SHORT TERM GOAL #1   Title Pt will be independent with initial HEP knee   Status Achieved   PT SHORT TERM GOAL #2   Title Report pain decrease with negotiating steps decreased by 50%using SPC   Status Achieved   PT SHORT TERM GOAL #3   Title Demonstrate and verbalize understanding of condition management including RICE, positioning, use of AD , HEP   Status Achieved           PT Long Term Goals - 08/29/14 1156    PT LONG TERM GOAL #1   Title Pt will be independent with advanced HEP   Status On-going   PT LONG TERM GOAL #2   Title Pain will decreased to 0/10 with  all functional activities   Baseline 0/10 to 1/10   Status On-going   PT LONG TERM GOAL #3   Title Right knee AROM will be +5 ext to at least +115 for improved mobility to ride in car and negotiate steps   Status Partially Met   PT LONG TERM GOAL #4   Title FOTO will improve from 47% limitation to 30% limitatiaon indicating improved functional mobililty   Baseline 28% 08/22/14   Status Achieved   PT LONG TERM GOAL #5   Title Pt will improve strength symmetrically inLE's to begin home walking program and return to exercise in community   Status On-going               Plan - 08/29/14 1154    Clinical Impression Statement Mr David Wall declined cold pack and reports he is pleased with progress. He is generally having no pain , mostly stiffness. He appears to be slowly improving his active ROM   PT Next Visit Plan Continue range , mobs, strengthening Possible hip hinge single and double leg with weight.   Cold as needed   Consulted and Agree with Plan of Care Patient        Problem List Patient Active Problem List   Diagnosis Date  Noted  . Osteoarthritis of right knee 06/15/2014  . Status post total right knee replacement 06/15/2014  . Sleep disorder 04/25/2014  . Inguinal hernia 12/21/2013  . Diabetes 10/11/2013  . Routine general medical examination at a health care facility 10/10/2013  . Screening for prostate cancer 10/10/2013  . GOUT 04/26/2008  . RECTAL BLEEDING 04/26/2008  . CHEST PAIN UNSPECIFIED 04/25/2007  . HYPERCHOLESTEROLEMIA 01/26/2007  . UNSPECIFIED CHRONIC ISCHEMIC HEART DISEASE 01/26/2007  . CERVICAL RADICULOPATHY, RIGHT 01/26/2007  . INSOMNIA 01/26/2007    David Wall PT 08/29/2014, 11:58 AM  [facility] [address] [place], [place], [address] Phone: [phone]   Fax:  [phone]

## 2014-08-30 ENCOUNTER — Telehealth: Payer: Self-pay | Admitting: Endocrinology

## 2014-08-30 ENCOUNTER — Telehealth: Payer: Self-pay

## 2014-08-30 NOTE — Telephone Encounter (Signed)
The pt stated that he needs Dr.Ellison to fill out the paper work stating that his knee problem was not a previous issue.

## 2014-08-30 NOTE — Telephone Encounter (Signed)
See note below and please advise on FMLA paper work

## 2014-08-30 NOTE — Telephone Encounter (Signed)
Pt stated to the front office that the form did not need to completed and he was contemplating changing PCP's at this time.

## 2014-08-30 NOTE — Telephone Encounter (Signed)
After communicating with the pt via phone about

## 2014-08-30 NOTE — Telephone Encounter (Signed)
The questions on the form are more specific, and the form should be done by the orthopedic doctor.  We are happy to send the form there.  Any records from [place] can be requested thru our med records dept.

## 2014-08-30 NOTE — Telephone Encounter (Signed)
Left a voicemail advising of note below. Requested call back from the pt to discuss.

## 2014-08-30 NOTE — Telephone Encounter (Signed)
please call patient: i received disability form.  Is this about your knee?  If so, it needs to go to orthopedics.

## 2014-08-30 NOTE — Telephone Encounter (Signed)
Pt returned call stating that the paper needed to be filled out and he was coming up to the office to ensure it got done.Once I told the pt that Jinny Blossom would call him once Dr.Ellison completed the paper work he stated that was unacceptable asking for the office manager

## 2014-08-31 NOTE — Telephone Encounter (Signed)
Tried to contact pt. Pt was unavailable.

## 2014-09-03 ENCOUNTER — Ambulatory Visit: Payer: BLUE CROSS/BLUE SHIELD | Admitting: Physical Therapy

## 2014-09-03 DIAGNOSIS — M25561 Pain in right knee: Secondary | ICD-10-CM | POA: Diagnosis not present

## 2014-09-03 DIAGNOSIS — M25661 Stiffness of right knee, not elsewhere classified: Secondary | ICD-10-CM

## 2014-09-03 DIAGNOSIS — R262 Difficulty in walking, not elsewhere classified: Secondary | ICD-10-CM

## 2014-09-03 DIAGNOSIS — R29898 Other symptoms and signs involving the musculoskeletal system: Secondary | ICD-10-CM

## 2014-09-03 NOTE — Therapy (Addendum)
[place], [place], [address] Phone: [phone]   Fax:  [phone]  Physical Therapy Treatment  Patient Details  Name: David Wall MRN: [id number] Date of Birth: [birth year] Referring Provider:  Renato Shin, MD  Encounter Date: 09/03/2014      PT End of Session - 09/03/14 1017    Visit Number 14   Number of Visits 20   Date for PT Re-Evaluation 09/24/14   PT Start Time 0935   PT Stop Time 1735   PT Time Calculation (min) 60 min   Activity Tolerance Patient tolerated treatment well;No increased pain   Behavior During Therapy [facility] for tasks assessed/performed      Past Medical History  Diagnosis Date  . Gout   . Asthma     hx of in childhood  . Diabetes mellitus without complication   . GERD (gastroesophageal reflux disease)   . Arthritis   . H/O urinary frequency     as noted in H&P per Dr Loanne Drilling 04/25/2014     Past Surgical History  Procedure Laterality Date  . Eye surgery      laser surgery bilat   . Upper gi endoscopy    . Total knee arthroplasty Right 06/15/2014    Procedure: RIGHT TOTAL KNEE ARTHROPLASTY;  Surgeon: Mcarthur Rossetti, MD;  Location: [facility];  Service: Orthopedics;  Laterality: Right;    There were no vitals filed for this visit.  Visit Diagnosis:  Decreased ROM of right knee  Knee pain, right  Difficulty walking  Weakness of right leg      Subjective Assessment - 09/03/14 0940    Subjective nO PAIN.  kNEE FEELS GOOD THIS AM.  wAS UP A LOT YESTERDAY AND IT GOT STIFF.  Has been going up and down 20 steps without problem. Uses rail.   Currently in Pain? No/denies   Pain Relieving Factors ice , elevation. sometimes    Multiple Pain Sites No                         [facility] Adult PT Treatment/Exercise - 09/03/14 0947    Knee/Hip Exercises: Stretches   Passive Hamstring Stretch 3 reps;30 seconds   Knee/Hip Exercises: Aerobic   Stationary  Bike L 4 8 minutes   Knee/Hip Exercises: Machines for Strengthening   Cybex Leg Press 2 plates 30, both, ,    Knee/Hip Exercises: Standing   Forward Step Up Right;20 reps;Step Height: 8"   Step Down Limitations lowering reveald decreased hip control.  10 reps.   Other Standing Knee Exercises sit to stand 10 reps.   Knee/Hip Exercises: Sidelying   Hip ABduction 10 reps  cues   Clams 10 reps 2 sets  cues   Cryotherapy   Number Minutes Cryotherapy 15 Minutes   Cryotherapy Location Knee   Type of Cryotherapy --  cold pack   Manual Therapy   Manual therapy comments --   Passive ROM -8 Passive extension, 115 passive flexion RT  Active flexion 110 RT                  PT Short Term Goals - 08/20/14 1020    PT SHORT TERM GOAL #1   Title Pt will be independent with initial HEP knee   Status Achieved   PT SHORT TERM GOAL #2   Title Report pain decrease with negotiating steps decreased by 50%using SPC   Status Achieved  PT SHORT TERM GOAL #3   Title Demonstrate and verbalize understanding of condition management including RICE, positioning, use of AD , HEP   Status Achieved           PT Long Term Goals - 09/03/14 1744    PT LONG TERM GOAL #1   Title Pt will be independent with advanced HEP   Time 8   Period Weeks   Status On-going   PT LONG TERM GOAL #2   Title Pain will decreased to 0/10 with all functional activities   Baseline no pain today   Time 4   Period Weeks   Status On-going               Plan - 09/03/14 1036    Clinical Impression Statement Seeis MD soon.  115 flexion PROM, -8 extension post exercise and manual.  No pain at end of session.   PT Next Visit Plan See what MD says.  Finalize home exercise program.  Patient thinks he is not going to need too much more PT, he is schedul;ed next week.        Problem List Patient Active Problem List   Diagnosis Date Noted  . Osteoarthritis of right knee 06/15/2014  . Status post total right  knee replacement 06/15/2014  . Sleep disorder 04/25/2014  . Inguinal hernia 12/21/2013  . Diabetes 10/11/2013  . Routine general medical examination at a health care facility 10/10/2013  . Screening for prostate cancer 10/10/2013  . GOUT 04/26/2008  . RECTAL BLEEDING 04/26/2008  . CHEST PAIN UNSPECIFIED 04/25/2007  . HYPERCHOLESTEROLEMIA 01/26/2007  . UNSPECIFIED CHRONIC ISCHEMIC HEART DISEASE 01/26/2007  . CERVICAL RADICULOPATHY, RIGHT 01/26/2007  . INSOMNIA 01/26/2007    David Wall 09/03/2014, 5:48 PM  [facility] [address] [place], [place], [address] Phone: [phone]   Fax:  [phone]     David Wall, PTA 09/03/2014 5:48 PM Phone: [phone] Fax: [phone] PHYSICAL THERAPY DISCHARGE SUMMARY  Visits from Start of Care: 15  Current functional level related to goals / functional outcomes: Unknown but plan was to discharge post MD visit if he did not return   Remaining deficits: Unknown . See above   Education / Equipment: HEP Plan: Patient agrees to discharge.  Patient goals were partially met. Patient is being discharged due to not returning since the last visit.  ?????    David Wall, PT      12/04/14       2:30 PM

## 2014-09-05 ENCOUNTER — Telehealth: Payer: Self-pay | Admitting: Endocrinology

## 2014-09-05 NOTE — Telephone Encounter (Signed)
[employer] called and asking about a fax request for Patient Capability and Medical Records, please advise, please call [phone] ex [phone]

## 2014-09-06 NOTE — Telephone Encounter (Signed)
I contacted [employer] and advised medical records have to be retrieved from our medical records department. Fax number given. Also advised Dr. Loanne Drilling has not filled out the [facility] paper, pt's orthopedic did. Requested call back if they would like to discuss.

## 2014-09-10 ENCOUNTER — Ambulatory Visit: Payer: BLUE CROSS/BLUE SHIELD

## 2014-09-12 ENCOUNTER — Encounter: Payer: Self-pay | Admitting: Gastroenterology

## 2014-09-12 ENCOUNTER — Encounter: Payer: Self-pay | Admitting: Family

## 2014-09-12 ENCOUNTER — Ambulatory Visit (INDEPENDENT_AMBULATORY_CARE_PROVIDER_SITE_OTHER): Payer: BLUE CROSS/BLUE SHIELD | Admitting: Family

## 2014-09-12 ENCOUNTER — Telehealth: Payer: Self-pay

## 2014-09-12 ENCOUNTER — Other Ambulatory Visit (INDEPENDENT_AMBULATORY_CARE_PROVIDER_SITE_OTHER): Payer: BLUE CROSS/BLUE SHIELD

## 2014-09-12 ENCOUNTER — Ambulatory Visit: Payer: BLUE CROSS/BLUE SHIELD

## 2014-09-12 VITALS — BP 116/72 | HR 83 | Temp 98.1°F | Ht 68.0 in | Wt 250.0 lb

## 2014-09-12 DIAGNOSIS — Z23 Encounter for immunization: Secondary | ICD-10-CM

## 2014-09-12 DIAGNOSIS — Z0000 Encounter for general adult medical examination without abnormal findings: Secondary | ICD-10-CM

## 2014-09-12 LAB — LIPID PANEL
CHOL/HDL RATIO: 6
CHOLESTEROL: 237 mg/dL — AB (ref 0–200)
HDL: 37.7 mg/dL — ABNORMAL LOW (ref >39.00)
LDL CALC: 159 mg/dL — AB (ref 0–99)
NonHDL: 198.88
TRIGLYCERIDES: 197 mg/dL — AB (ref 0.0–149.0)
VLDL: 39.4 mg/dL (ref 0.0–40.0)

## 2014-09-12 LAB — COMPREHENSIVE METABOLIC PANEL
ALT: 18 U/L (ref 0–53)
AST: 15 U/L (ref 0–37)
Albumin: 4.4 g/dL (ref 3.5–5.2)
Alkaline Phosphatase: 71 U/L (ref 39–117)
BILIRUBIN TOTAL: 0.3 mg/dL (ref 0.2–1.2)
BUN: 22 mg/dL (ref 6–23)
CHLORIDE: 103 meq/L (ref 96–112)
CO2: 25 meq/L (ref 19–32)
CREATININE: 1.02 mg/dL (ref 0.40–1.50)
Calcium: 9.8 mg/dL (ref 8.4–10.5)
GFR: 96.72 mL/min (ref >60.00)
Glucose, Bld: 96 mg/dL (ref 70–99)
Potassium: 4.6 mEq/L (ref 3.5–5.1)
Sodium: 138 mEq/L (ref 135–145)
Total Protein: 7.5 g/dL (ref 6.0–8.3)

## 2014-09-12 LAB — CBC
HEMATOCRIT: 43.7 % (ref 39.0–52.0)
Hemoglobin: 14.5 g/dL (ref 13.0–17.0)
MCHC: 33.2 g/dL (ref 30.0–36.0)
MCV: 86.3 fl (ref 78.0–100.0)
PLATELETS: 367 10*3/uL (ref 150.0–400.0)
RBC: 5.07 Mil/uL (ref 4.22–5.81)
RDW: 14.3 % (ref 11.5–15.5)
WBC: 11.8 10*3/uL — ABNORMAL HIGH (ref 4.0–10.5)

## 2014-09-12 LAB — PSA: PSA: 1.29 ng/mL (ref 0.10–4.00)

## 2014-09-12 LAB — TSH: TSH: 2.76 u[IU]/mL (ref 0.35–4.50)

## 2014-09-12 NOTE — Patient Instructions (Signed)
Thank you for choosing [facility].  Summary/Instructions:   Please stop by the lab on the basement level of the building for your blood work. Your results will be released to MyChart (or called to you) after review, usually within 72 hours after test completion. If any changes need to be made, you will be notified at that same time.  Health Maintenance A healthy lifestyle and preventative care can promote health and wellness.  Maintain regular health, dental, and eye exams.  Eat a healthy diet. Foods like vegetables, fruits, whole grains, low-fat dairy products, and lean protein foods contain the nutrients you need and are low in calories. Decrease your intake of foods high in solid fats, added sugars, and salt. Get information about a proper diet from your health care provider, if necessary.  Regular physical exercise is one of the most important things you can do for your health. Most adults should get at least 150 minutes of moderate-intensity exercise (any activity that increases your heart rate and causes you to sweat) each week. In addition, most adults need muscle-strengthening exercises on 2 or more days a week.   Maintain a healthy weight. The body mass index (BMI) is a screening tool to identify possible weight problems. It provides an estimate of body fat based on height and weight. Your health care provider can find your BMI and can help you achieve or maintain a healthy weight. For males 20 years and older:  A BMI below 18.5 is considered underweight.  A BMI of 18.5 to 24.9 is normal.  A BMI of 25 to 29.9 is considered overweight.  A BMI of 30 and above is considered obese.  Maintain normal blood lipids and cholesterol by exercising and minimizing your intake of saturated fat. Eat a balanced diet with plenty of fruits and vegetables. Blood tests for lipids and cholesterol should begin at age 20 and be repeated every 5 years. If your lipid or cholesterol levels are  high, you are over age 50, or you are at high risk for heart disease, you may need your cholesterol levels checked more frequently.Ongoing high lipid and cholesterol levels should be treated with medicines if diet and exercise are not working.  If you smoke, find out from your health care provider how to quit. If you do not use tobacco, do not start.  Lung cancer screening is recommended for adults aged 55-80 years who are at high risk for developing lung cancer because of a history of smoking. A yearly low-dose CT scan of the lungs is recommended for people who have at least a 30-pack-year history of smoking and are current smokers or have quit within the past 15 years. A pack year of smoking is smoking an average of 1 pack of cigarettes a day for 1 year (for example, a 30-pack-year history of smoking could mean smoking 1 pack a day for 30 years or 2 packs a day for 15 years). Yearly screening should continue until the smoker has stopped smoking for at least 15 years. Yearly screening should be stopped for people who develop a health problem that would prevent them from having lung cancer treatment.  If you choose to drink alcohol, do not have more than 2 drinks per day. One drink is considered to be 12 oz (360 mL) of beer, 5 oz (150 mL) of wine, or 1.5 oz (45 mL) of liquor.  Avoid the use of street drugs. Do not share needles with anyone. Ask for help if you need   support or instructions about stopping the use of drugs.  High blood pressure causes heart disease and increases the risk of stroke. Blood pressure should be checked at least every 1-2 years. Ongoing high blood pressure should be treated with medicines if weight loss and exercise are not effective.  If you are 45-79 years old, ask your health care provider if you should take aspirin to prevent heart disease.  Diabetes screening involves taking a blood sample to check your fasting blood sugar level. This should be done once every 3 years  after age 45 if you are at a normal weight and without risk factors for diabetes. Testing should be considered at a younger age or be carried out more frequently if you are overweight and have at least 1 risk factor for diabetes.  Colorectal cancer can be detected and often prevented. Most routine colorectal cancer screening begins at the age of 50 and continues through age 75. However, your health care provider may recommend screening at an earlier age if you have risk factors for colon cancer. On a yearly basis, your health care provider may provide home test kits to check for hidden blood in the stool. A small camera at the end of a tube may be used to directly examine the colon (sigmoidoscopy or colonoscopy) to detect the earliest forms of colorectal cancer. Talk to your health care provider about this at age 50 when routine screening begins. A direct exam of the colon should be repeated every 5-10 years through age 75, unless early forms of precancerous polyps or small growths are found.  People who are at an increased risk for hepatitis B should be screened for this virus. You are considered at high risk for hepatitis B if:  You were born in a country where hepatitis B occurs often. Talk with your health care provider about which countries are considered high risk.  Your parents were born in a high-risk country and you have not received a shot to protect against hepatitis B (hepatitis B vaccine).  You have HIV or AIDS.  You use needles to inject street drugs.  You live with, or have sex with, someone who has hepatitis B.  You are a man who has sex with other men (MSM).  You get hemodialysis treatment.  You take certain medicines for conditions like cancer, organ transplantation, and autoimmune conditions.  Hepatitis C blood testing is recommended for all people born from 1945 through 1965 and any individual with known risk factors for hepatitis C.  Healthy men should no longer receive  prostate-specific antigen (PSA) blood tests as part of routine cancer screening. Talk to your health care provider about prostate cancer screening.  Testicular cancer screening is not recommended for adolescents or adult males who have no symptoms. Screening includes self-exam, a health care provider exam, and other screening tests. Consult with your health care provider about any symptoms you have or any concerns you have about testicular cancer.  Practice safe sex. Use condoms and avoid high-risk sexual practices to reduce the spread of sexually transmitted infections (STIs).  You should be screened for STIs, including gonorrhea and chlamydia if:  You are sexually active and are younger than 24 years.  You are older than 24 years, and your health care provider tells you that you are at risk for this type of infection.  Your sexual activity has changed since you were last screened, and you are at an increased risk for chlamydia or gonorrhea. Ask your health care   provider if you are at risk.  If you are at risk of being infected with HIV, it is recommended that you take a prescription medicine daily to prevent HIV infection. This is called pre-exposure prophylaxis (PrEP). You are considered at risk if:  You are a man who has sex with other men (MSM).  You are a heterosexual man who is sexually active with multiple partners.  You take drugs by injection.  You are sexually active with a partner who has HIV.  Talk with your health care provider about whether you are at high risk of being infected with HIV. If you choose to begin PrEP, you should first be tested for HIV. You should then be tested every 3 months for as long as you are taking PrEP.  Use sunscreen. Apply sunscreen liberally and repeatedly throughout the day. You should seek shade when your shadow is shorter than you. Protect yourself by wearing long sleeves, pants, a wide-brimmed hat, and sunglasses year round whenever you are  outdoors.  Tell your health care provider of new moles or changes in moles, especially if there is a change in shape or color. Also, tell your health care provider if a mole is larger than the size of a pencil eraser.  A one-time screening for abdominal aortic aneurysm (AAA) and surgical repair of large AAAs by ultrasound is recommended for men aged 65-75 years who are current or former smokers.  Stay current with your vaccines (immunizations). Document Released: 07/04/2007 Document Revised: 01/10/2013 Document Reviewed: 06/02/2010 Exit[employer] Patient Information 2015 [employer]. This information is not intended to replace advice given to you by your health care provider. Make sure you discuss any questions you have with your health care provider.   

## 2014-09-12 NOTE — Assessment & Plan Note (Addendum)
1) Anticipatory Guidance: Discussed importance of wearing a seatbelt while driving and not texting while driving; changing batteries in smoke detector at least once annually; wearing suntan lotion when outside; eating a balanced and moderate diet; getting physical activity at least 30 minutes per day.  2) Immunizations / Screenings / Labs:  Pneumovax updated today. Declines influenza vaccination at this time. All other immunizations are up-to-date per recommendations. Declines hepatitis C screening. PSA and HIV added to blood work. Due for colonoscopy - referral placed.  All other screenings are up-to-date per recommendations. Obtain CBC, CMET, Lipid profile and TSH.   Overall well exam. Patient has minimal risk factors for cardiovascular disease at this time with the exception of obesity. Counseled regarding establishing goal of 5-10% of current body weight loss through increased nutrient density and decreased saturated fats. Emphasized importance of increasing physical activity to 30 minutes most days of the week. He is currently working towards this with a right knee replacement which appears healing well. Continue other healthy lifestyle choices. Follow up office visit pending lab work and follow-up prevention exam in one year.

## 2014-09-12 NOTE — Progress Notes (Signed)
Subjective:    Patient ID: Leafy Half, male    DOB: [birth year], 57 y.o.   MRN: [id number]  Chief Complaint  Patient presents with  . Establish Care    CPE    HPI:  David Wall is a 59 y.o. male who presents today for an annual wellness visit.   1) Health Maintenance -   Diet - Averages about 2 meals per day consisting of chicken, vegetables, fruit, little dairy, beef, seldom fish; 1 cup of caffeine per day  Exercise - 3x per week at the gym; recently had right knee replacement   2) Preventative Exams / Immunizations:  Dental -- Up to date  Vision -- Up to date and scheduled for next month.    Health Maintenance  Topic Date Due  . URINE MICROALBUMIN  06/03/1967  . HIV Screening  06/02/1972  . COLONOSCOPY  06/03/2007  . INFLUENZA VACCINE  10/01/2014 (Originally 08/20/2014)  . Hepatitis C Screening  08/20/2015 (Originally 18-Mar-1955)  . HEMOGLOBIN A1C  10/25/2014  . OPHTHALMOLOGY EXAM  02/28/2015  . FOOT EXAM  04/25/2015  . PNEUMOCOCCAL POLYSACCHARIDE VACCINE (2) 09/12/2019  . TETANUS/TDAP  10/11/2023    Immunization History  Administered Date(s) Administered  . Influenza Whole 01/19/2006  . Influenza,inj,Quad PF,36+ Mos 10/10/2013  . Pneumococcal Polysaccharide-23 09/12/2014  . Td 04/26/2008  . Tdap 10/10/2013  Pnumovax  Allergies  Allergen Reactions  . Amoxicillin Shortness Of Breath  . Aspirin Shortness Of Breath  . Penicillins Shortness Of Breath  . Allopurinol Rash     Outpatient Prescriptions Prior to Visit  Medication Sig Dispense Refill  . hydrocodone-ibuprofen (VICOPROFEN) 5-200 MG per tablet Take 1 tablet by mouth 4 (four) times daily.    . tamsulosin (FLOMAX) 0.4 MG CAPS capsule Take 1 capsule (0.4 mg total) by mouth daily. 30 capsule 3  . methocarbamol (ROBAXIN) 500 MG tablet Take 1 tablet (500 mg total) by mouth every 6 (six) hours as needed for muscle spasms. (Patient not taking: Reported on 09/12/2014) 60 tablet 0  . naproxen (NAPROSYN)  500 MG tablet Take 1 tablet (500 mg total) by mouth 2 (two) times daily. (Patient not taking: Reported on 06/12/2014) 15 tablet 0  . oxyCODONE-acetaminophen (ROXICET) 5-325 MG per tablet Take 1-2 tablets by mouth every 4 (four) hours as needed. (Patient not taking: Reported on 07/02/2014) 60 tablet 0  . pioglitazone (ACTOS) 15 MG tablet Take 1 tablet (15 mg total) by mouth daily. (Patient not taking: Reported on 09/12/2014) 90 tablet 3  . rivaroxaban (XARELTO) 10 MG TABS tablet Take 1 tablet (10 mg total) by mouth daily with breakfast. (Patient not taking: Reported on 09/12/2014) 15 tablet 0   No facility-administered medications prior to visit.     Past Medical History  Diagnosis Date  . Gout   . Diabetes mellitus without complication   . GERD (gastroesophageal reflux disease)   . Arthritis   . H/O urinary frequency     as noted in H&P per Dr Loanne Drilling 04/25/2014   . Asthma     hx of in childhood     Past Surgical History  Procedure Laterality Date  . Upper gi endoscopy    . Total knee arthroplasty Right 06/15/2014    Procedure: RIGHT TOTAL KNEE ARTHROPLASTY;  Surgeon: Mcarthur Rossetti, MD;  Location: [facility];  Service: Orthopedics;  Laterality: Right;  . Eye surgery      laser surgery bilat      Family History  Problem Relation Age  of Onset  . Healthy Mother   . Healthy Father      Social History   Social History  . Marital Status: Married    Spouse Name: N/A  . Number of Children: 8  . Years of Education: 12   Occupational History  . Cook    Social History Main Topics  . Smoking status: Never Smoker   . Smokeless tobacco: Never Used  . Alcohol Use: Yes     Comment: occ  . Drug Use: No  . Sexual Activity: Not on file   Other Topics Concern  . Not on file   Social History Narrative   Fun: Go to the mountains and relax    Review of Systems  Constitutional: Denies fever, chills, fatigue, or significant weight gain/loss. HENT: Head: Denies headache or  neck pain Ears: Denies changes in hearing, ringing in ears, earache, drainage Nose: Denies discharge, stuffiness, itching, nosebleed, sinus pain Throat: Denies sore throat, hoarseness, dry mouth, sores, thrush Eyes: Denies loss/changes in vision, pain, redness, blurry/double vision, flashing lights Cardiovascular: Denies chest pain/discomfort, tightness, palpitations, shortness of breath with activity, difficulty lying down, swelling, sudden awakening with shortness of breath Respiratory: Denies shortness of breath, cough, sputum production, wheezing Gastrointestinal: Denies dysphasia, heartburn, change in appetite, nausea, change in bowel habits, rectal bleeding, constipation, diarrhea, yellow skin or eyes Possible hernia Genitourinary: Denies frequency, urgency, burning/pain, blood in urine, incontinence, change in urinary strength. Musculoskeletal: Denies muscle/joint pain, stiffness, back pain, redness or swelling of joints, trauma Skin: Denies rashes, lumps, itching, dryness, color changes, or hair/nail changes Neurological: Denies dizziness, fainting, seizures, weakness, numbness, tingling, tremor Psychiatric - Denies nervousness, stress, depression or memory loss Endocrine: Denies heat or cold intolerance, sweating, frequent urination, excessive thirst, changes in appetite Hematologic: Denies ease of bruising or bleeding     Objective:    BP 116/72 mmHg  Pulse 83  Temp(Src) 98.1 F (36.7 C)  Ht 5\' 8"  (1.727 m)  Wt 250 lb (113.399 kg)  BMI 38.02 kg/m2  SpO2 98% Nursing note and vital signs reviewed.  Physical Exam  Constitutional: He is oriented to person, place, and time. He appears well-developed and well-nourished.  HENT:  Head: Normocephalic.  Right Ear: Hearing, tympanic membrane, external ear and ear canal normal.  Left Ear: Hearing, tympanic membrane, external ear and ear canal normal.  Nose: Nose normal.  Mouth/Throat: Uvula is midline, oropharynx is clear and moist  and mucous membranes are normal.  Eyes: Conjunctivae and EOM are normal. Pupils are equal, round, and reactive to light.  Neck: Neck supple. No JVD present. No tracheal deviation present. No thyromegaly present.  Cardiovascular: Normal rate, regular rhythm, normal heart sounds and intact distal pulses.   Pulmonary/Chest: Effort normal and breath sounds normal.  Abdominal: Soft. Bowel sounds are normal. He exhibits no distension and no mass. There is no tenderness. There is no rebound and no guarding.  Small, mobile, soft mass located just under his right rib around mid-clavicular line.   Musculoskeletal: Normal range of motion. He exhibits no edema or tenderness.  Well-healing keloid scar tissue noted on right knee incision. No evidence of infection.  Lymphadenopathy:    He has no cervical adenopathy.  Neurological: He is alert and oriented to person, place, and time. He has normal reflexes. No cranial nerve deficit. He exhibits normal muscle tone. Coordination normal.  Skin: Skin is warm and dry.  Psychiatric: He has a normal mood and affect. His behavior is normal. Judgment and thought content normal.  Assessment & Plan:   Problem List Items Addressed This Visit      Other   Routine general medical examination at a health care facility - Primary    1) Anticipatory Guidance: Discussed importance of wearing a seatbelt while driving and not texting while driving; changing batteries in smoke detector at least once annually; wearing suntan lotion when outside; eating a balanced and moderate diet; getting physical activity at least 30 minutes per day.  2) Immunizations / Screenings / Labs:  Pneumovax updated today. Declines influenza vaccination at this time. All other immunizations are up-to-date per recommendations. Declines hepatitis C screening. PSA and HIV added to blood work. Due for colonoscopy - referral placed.  All other screenings are up-to-date per recommendations. Obtain CBC,  CMET, Lipid profile and TSH.   Overall well exam. Patient has minimal risk factors for cardiovascular disease at this time with the exception of obesity. Counseled regarding establishing goal of 5-10% of current body weight loss through increased nutrient density and decreased saturated fats. Emphasized importance of increasing physical activity to 30 minutes most days of the week. He is currently working towards this with a right knee replacement which appears healing well. Continue other healthy lifestyle choices. Follow up office visit pending lab work and follow-up prevention exam in one year.      Relevant Orders   Ambulatory referral to Gastroenterology   Comprehensive metabolic panel   CBC   Lipid panel   TSH   PSA   HIV antibody    Other Visit Diagnoses    Need for 23-polyvalent pneumococcal polysaccharide vaccine        Relevant Orders    Pneumococcal polysaccharide vaccine 23-valent greater than or equal to 2yo subcutaneous/IM (Completed)

## 2014-09-12 NOTE — Telephone Encounter (Signed)
FMLA paper work was received from [employer] located at [employer]. Suite number, phone number and fax number were not include. I mailed the information back to [facility] stating Dr. Loanne Drilling would not sign off on the paper work and request needs to be sent to the pt's orthopedic Doctor.

## 2014-09-12 NOTE — Progress Notes (Signed)
Pre visit review using our clinic review tool, if applicable. No additional management support is needed unless otherwise documented below in the visit note. 

## 2014-09-13 ENCOUNTER — Telehealth: Payer: Self-pay | Admitting: Family

## 2014-09-13 LAB — HIV ANTIBODY (ROUTINE TESTING W REFLEX): HIV: NONREACTIVE

## 2014-09-13 NOTE — Telephone Encounter (Signed)
Please inform patient that his blood work shows that his thyroid, prostate, white/red blood cells, liver and kidney function are all within the normal ranges. His HIV was negative. His good cholesterol was low and his bad cholesterol was elevated. Based on these numbers and factoring in the risk factors for coronary artery disease, the current recommendations support the use of a cholesterol medication to reduce his risk for coronary artery disease. I would like to start him on a statin medication with his permission.

## 2014-09-14 NOTE — Telephone Encounter (Signed)
Pt wants to start medication. Send to pharmacy listed on list.

## 2014-09-17 MED ORDER — PRAVASTATIN SODIUM 40 MG PO TABS: 40.0000 mg | ORAL_TABLET | Freq: Every day | ORAL | Status: DC

## 2014-09-17 NOTE — Telephone Encounter (Signed)
Medication sent to pharmacy. Please have him inform us if he develops any new onset muscle or joint pains.

## 2014-09-18 NOTE — Telephone Encounter (Signed)
LVM making pt aware

## 2014-10-08 ENCOUNTER — Encounter (HOSPITAL_COMMUNITY): Payer: Self-pay | Admitting: Emergency Medicine

## 2014-10-08 ENCOUNTER — Emergency Department (INDEPENDENT_AMBULATORY_CARE_PROVIDER_SITE_OTHER)
Admission: EM | Admit: 2014-10-08 | Discharge: 2014-10-08 | Disposition: A | Discharge status: Home / Self Care | Payer: BLUE CROSS/BLUE SHIELD | Source: Home / Self Care | Attending: Family Medicine | Admitting: Family Medicine

## 2014-10-08 DIAGNOSIS — R1084 Generalized abdominal pain: Secondary | ICD-10-CM | POA: Diagnosis not present

## 2014-10-08 DIAGNOSIS — M1000 Idiopathic gout, unspecified site: Secondary | ICD-10-CM

## 2014-10-08 MED ORDER — PEG 3350-KCL-NA BICARB-NACL 420 G PO SOLR: 4000.0000 mL | Freq: Once | ORAL | Status: DC

## 2014-10-08 MED ORDER — KETOROLAC TROMETHAMINE 60 MG/2ML IM SOLN
60.0000 mg | Freq: Once | INTRAMUSCULAR | Status: AC
  Administered 2014-10-08: 60 mg via INTRAMUSCULAR

## 2014-10-08 MED ORDER — KETOROLAC TROMETHAMINE 60 MG/2ML IM SOLN
INTRAMUSCULAR | Status: AC
  Filled 2014-10-08: qty 2

## 2014-10-08 MED ORDER — COLCHICINE 0.6 MG PO TABS: ORAL_TABLET | ORAL | Status: DC

## 2014-10-08 NOTE — ED Provider Notes (Signed)
CSN: [id number]     Arrival date & time 10/08/14  1312 History   First MD Initiated Contact with Patient 10/08/14 1400     Chief Complaint  Patient presents with  . Gout  . Abdominal Pain   (Consider location/radiation/quality/duration/timing/severity/associated sxs/prior Treatment) HPI\     L ant foot pain Last attack year ago Increased meat intake recently Has not tried anything  Constant and getitng worse Started 4 days ago  Belly pain generalized 2 wks ago thightness Tied knots SOB w/ ly8ing down layin on R side worse but improves when laying on back.  Improves w/ movement.  BM 3-4x daily BMs. Typically very soft.   Past Medical History  Diagnosis Date  . Gout   . Diabetes mellitus without complication   . GERD (gastroesophageal reflux disease)   . Arthritis   . H/O urinary frequency     as noted in H&P per Dr Loanne Drilling 04/25/2014   . Asthma     hx of in childhood   Past Surgical History  Procedure Laterality Date  . Upper gi endoscopy    . Total knee arthroplasty Right 06/15/2014    Procedure: RIGHT TOTAL KNEE ARTHROPLASTY;  Surgeon: Mcarthur Rossetti, MD;  Location: [facility];  Service: Orthopedics;  Laterality: Right;  . Eye surgery      laser surgery bilat    Family History  Problem Relation Age of Onset  . Healthy Mother   . Healthy Father    Social History  Substance Use Topics  . Smoking status: Never Smoker   . Smokeless tobacco: Never Used  . Alcohol Use: Yes     Comment: occ    Review of Systems Per HPI with all other pertinent systems negative.   Allergies  Amoxicillin; Aspirin; Penicillins; and Allopurinol  Home Medications   Prior to Admission medications   Medication Sig Start Date End Date Taking? Authorizing Provider  colchicine 0.6 MG tablet Take 2 tabs by mouth then 1 tab 6 hours later for acute attack. Start 1 tab daily 12 hours after resolution of flare. 10/08/14   Waldemar Dickens, MD  hydrocodone-ibuprofen (VICOPROFEN)  5-200 MG per tablet Take 1 tablet by mouth 4 (four) times daily.    Historical Provider, MD  polyethylene glycol-electrolytes (NULYTELY/GOLYTELY) 420 G solution Take 4,000 mLs by mouth once. 10/08/14   Waldemar Dickens, MD  pravastatin (PRAVACHOL) 40 MG tablet Take 1 tablet (40 mg total) by mouth daily. 09/17/14   Golden Circle, FNP  tamsulosin (FLOMAX) 0.4 MG CAPS capsule Take 1 capsule (0.4 mg total) by mouth daily. 04/25/14   Renato Shin, MD   Meds Ordered and Administered this Visit   Medications  ketorolac (TORADOL) injection 60 mg (not administered)    BP 126/69 mmHg  Pulse 80  Temp(Src) 97.7 F (36.5 C) (Oral)  Resp 16  SpO2 100% No data found.   Physical Exam Physical Exam  Constitutional: oriented to person, place, and time. appears well-developed and well-nourished. No distress.  HENT:  Head: Normocephalic and atraumatic.  Eyes: EOMI. PERRL.  Neck: Normal range of motion.  Cardiovascular: RRR, no m/r/g, 2+ distal pulses,  Pulmonary/Chest: Effort normal and breath sounds normal. No respiratory distress.  Abdominal: Soft. Bowel sounds are normal. NonTTP, no distension.  Musculoskeletal: Mild erythema and swelling of the left foot anteriorly typically around the MTP joints of the first through third toes. Mild tenderness to palpation.  Neurological: alert and oriented to person, place, and time.  Skin: Skin  is warm. No rash noted. non diaphoretic.  Psychiatric: normal mood and affect. behavior is normal. Judgment and thought content normal.   ED Course  Procedures (including critical care time)  Labs Review Labs Reviewed - No data to display  Imaging Review No results found.   Visual Acuity Review  Right Eye Distance:   Left Eye Distance:   Bilateral Distance:    Right Eye Near:   Left Eye Near:    Bilateral Near:         MDM   1. Acute idiopathic gout, unspecified site   2. Generalized abdominal pain    Total 60 mg IM given. Start colchicine.  Suspect patient's flare likely came from recent increase in protein intake.  Suspect patient's abdominal discomfort is either from muscular skeletal strain or from constipation/obstipation. Pt to use miralax in increasing doses or Golytely    Waldemar Dickens, MD 10/08/14 1435

## 2014-10-08 NOTE — ED Notes (Signed)
C/o gout flare up and upper abd pain Denies any vomiting, diarrhea and nausea Sob when laying down and moving due to pain Bowel movements are regular

## 2014-10-08 NOTE — Discharge Instructions (Signed)
You are likely suffering from a gout flare. Your given an injection of medicine called Toradol to help relieve your pain quickly. Please use colchicine as prescribed. Because of your abdominal discomfort is likely from either constipation or muscle strain. Please consider using increasing doses of MiraLAX until he have several large bowel movements to clean out her colon and use it as needed with the goal to have 1 soft daily bowel movement. He also consider using the GoLYTELY or you drink it over several hours stay by the bathroom for several hours until you're: Is cleaned out. Please also consider starting a daily probiotic to help with your normal gut function.

## 2014-10-11 ENCOUNTER — Ambulatory Visit: Payer: 59 | Admitting: Endocrinology

## 2014-10-25 ENCOUNTER — Encounter: Payer: Self-pay | Admitting: Endocrinology

## 2014-11-19 ENCOUNTER — Encounter: Payer: BLUE CROSS/BLUE SHIELD | Admitting: Gastroenterology

## 2015-01-05 ENCOUNTER — Other Ambulatory Visit: Payer: Self-pay | Admitting: Family

## 2015-01-08 ENCOUNTER — Ambulatory Visit: Payer: BLUE CROSS/BLUE SHIELD

## 2015-04-07 ENCOUNTER — Emergency Department (INDEPENDENT_AMBULATORY_CARE_PROVIDER_SITE_OTHER)
Admission: EM | Admit: 2015-04-07 | Discharge: 2015-04-07 | Disposition: A | Discharge status: Home / Self Care | Payer: BLUE CROSS/BLUE SHIELD | Source: Home / Self Care | Attending: Emergency Medicine | Admitting: Emergency Medicine

## 2015-04-07 ENCOUNTER — Emergency Department (INDEPENDENT_AMBULATORY_CARE_PROVIDER_SITE_OTHER): Payer: BLUE CROSS/BLUE SHIELD

## 2015-04-07 ENCOUNTER — Encounter (HOSPITAL_COMMUNITY): Payer: Self-pay | Admitting: *Deleted

## 2015-04-07 DIAGNOSIS — M7531 Calcific tendinitis of right shoulder: Secondary | ICD-10-CM

## 2015-04-07 MED ORDER — BUPIVACAINE HCL 0.25 % IJ SOLN
10.0000 mL | Freq: Once | INTRAMUSCULAR | Status: AC
  Administered 2015-04-07: 10 mL

## 2015-04-07 MED ORDER — TRIAMCINOLONE ACETONIDE 40 MG/ML IJ SUSP
40.0000 mg | Freq: Once | INTRAMUSCULAR | Status: AC
  Administered 2015-04-07: 40 mg via INTRAMUSCULAR

## 2015-04-07 MED ORDER — TRIAMCINOLONE ACETONIDE 40 MG/ML IJ SUSP
INTRAMUSCULAR | Status: AC
  Filled 2015-04-07: qty 1

## 2015-04-07 MED ORDER — BUPIVACAINE HCL (PF) 0.5 % IJ SOLN
INTRAMUSCULAR | Status: AC
  Filled 2015-04-07: qty 10

## 2015-04-07 MED ORDER — PREDNISONE 50 MG PO TABS: 50.0000 mg | ORAL_TABLET | Freq: Every day | ORAL | Status: DC

## 2015-04-07 NOTE — Discharge Instructions (Signed)
Calcific Tendinitis Calcific tendinitis occurs when crystals of calcium are deposited in a tendon. Tendons are bands of strong, fibrous tissue that attach muscles to bones. Tendons are an important part of joints. They make the joint move and they absorb some of the stress that a joint receives during use. When calcium is deposited in the tendon, the tendon becomes stiff, painful, and it can become swollen. Calcific tendinitis occurs frequently in the shoulder joint, in a structure called the rotator cuff. CAUSES  The cause of calcific tendinitis is unclear. It may be associated with:  Overuse of the tendon, such as from repetitive motion.  Excess stress on the tendon.  Aging.  Repetitive, mild injuries. SYMPTOMS   Pain may or may not be present. If it is present, it may occur when moving the joint.  Tenderness when pressure is applied to the tendon.  A snapping or popping sound when the joint moves.  Decreased motion of the joint.  Difficulty sleeping due to pain in the joint. DIAGNOSIS  Your health care provider will perform a physical exam. Imaging tests may also be used to make the diagnosis. These may include X-rays, an MRI, or a CT scan. TREATMENT  Generally, calcific tendinitis resolves on its own. Treatment for pain of calcific tendinitis may include:  Taking over-the-counter medicines, such as anti-inflammatory drugs.  Applying ice packs to the joint.  Following a specific exercise program to keep the joint working properly.  Attending physical therapy sessions.  Avoiding activities that cause pain. Treatment for more severe calcific tendinitis may require:  Injecting cortisone steroids or pain relieving medicines into or around the joint.  Manipulating the joint after you are given medicine to numb the area (local anesthetic).  Inflating the joint with sterile fluid to increase the flexibility of the tendons.  Shock wave therapy, which involves focusing sound  waves on the joint. If other treatments do not work, surgery may be done to clean out the calcium deposits and repair the tendons where needed. Most people do not need surgery. HOME CARE INSTRUCTIONS   Only take over-the-counter or prescription medicines for pain, fever, or discomfort as directed by your health care provider.  Follow your health care provider's recommendations for activity and exercise. SEEK MEDICAL CARE IF:  You notice an increase in pain or numbness.  You develop new weakness.  You notice increased joint stiffness or a sensation of looseness in the joint.  You notice increasing redness, swelling, or warmth around the joint area. SEEK IMMEDIATE MEDICAL CARE IF:  You have a fever or persistent symptoms for more than 2 to 3 days.  You have a fever and your symptoms suddenly get worse. MAKE SURE YOU:  Understand these instructions.  Will watch your condition.  Will get help right away if you are not doing well or get worse.   This information is not intended to replace advice given to you by your health care provider. Make sure you discuss any questions you have with your health care provider.   Document Released: 10/15/2007 Document Revised: 09/26/2014 Document Reviewed: 04/16/2011 Elsevier Interactive Patient Education 2016 [place] therapy can help ease sore, stiff, injured, and tight muscles and joints. Heat relaxes your muscles, which may help ease your pain.  RISKS AND COMPLICATIONS If you have any of the following conditions, do not use heat therapy unless your health care provider has approved:  Poor circulation.  Healing wounds or scarred skin in the area being treated.  Diabetes, heart disease, or high blood pressure.  Not being able to feel (numbness) the area being treated.  Unusual swelling of the area being treated.  Active infections.  Blood clots.  Cancer.  Inability to communicate pain. This may include  young children and people who have problems with their brain function (dementia).  Pregnancy. Heat therapy should only be used on old, pre-existing, or long-lasting (chronic) injuries. Do not use heat therapy on new injuries unless directed by your health care provider. HOW TO USE HEAT THERAPY There are several different kinds of heat therapy, including:  Moist heat pack.  Warm water bath.  Hot water bottle.  Electric heating pad.  Heated gel pack.  Heated wrap.  Electric heating pad. Use the heat therapy method suggested by your health care provider. Follow your health care provider's instructions on when and how to use heat therapy. GENERAL HEAT THERAPY RECOMMENDATIONS  Do not sleep while using heat therapy. Only use heat therapy while you are awake.  Your skin may turn pink while using heat therapy. Do not use heat therapy if your skin turns red.  Do not use heat therapy if you have new pain.  High heat or long exposure to heat can cause burns. Be careful when using heat therapy to avoid burning your skin.  Do not use heat therapy on areas of your skin that are already irritated, such as with a rash or sunburn. SEEK MEDICAL CARE IF:  You have blisters, redness, swelling, or numbness.  You have new pain.  Your pain is worse. MAKE SURE YOU:  Understand these instructions.  Will watch your condition.  Will get help right away if you are not doing well or get worse.   This information is not intended to replace advice given to you by your health care provider. Make sure you discuss any questions you have with your health care provider.   Document Released: 03/30/2011 Document Revised: 01/26/2014 Document Reviewed: 02/28/2013 Elsevier Interactive Patient Education [employer].

## 2015-04-07 NOTE — ED Notes (Signed)
C/O flare-up of right shoulder pain x 2 days; had cortisone inj done by Dr. Ninfa Linden approx 6 mo ago.  Has been taking Tyl & Aleve without significant relief.

## 2015-04-07 NOTE — ED Provider Notes (Signed)
CSN: [id number]     Arrival date & time 04/07/15  1702 History   First MD Initiated Contact with Patient 04/07/15 1810     Chief Complaint  Patient presents with  . Shoulder Pain   (Consider location/radiation/quality/duration/timing/severity/associated sxs/prior Treatment) HPI  Past Medical History  Diagnosis Date  . Gout   . Diabetes mellitus without complication ([place])   . GERD (gastroesophageal reflux disease)   . Arthritis   . H/O urinary frequency     as noted in H&P per Dr Loanne Drilling 04/25/2014   . Asthma     hx of in childhood   Past Surgical History  Procedure Laterality Date  . Upper gi endoscopy    . Total knee arthroplasty Right 06/15/2014    Procedure: RIGHT TOTAL KNEE ARTHROPLASTY;  Surgeon: Mcarthur Rossetti, MD;  Location: [facility];  Service: Orthopedics;  Laterality: Right;  . Eye surgery      laser surgery bilat   . Joint replacement     Family History  Problem Relation Age of Onset  . Healthy Mother   . Healthy Father    Social History  Substance Use Topics  . Smoking status: Never Smoker   . Smokeless tobacco: Never Used  . Alcohol Use: Yes     Comment: occasional    Review of Systems  Allergies  Amoxicillin; Aspirin; Penicillins; and Allopurinol  Home Medications   Prior to Admission medications   Medication Sig Start Date End Date Taking? Authorizing Provider  pravastatin (PRAVACHOL) 40 MG tablet TAKE 1 TABLET BY MOUTH DAILY 01/07/15  Yes Golden Circle, FNP  tamsulosin (FLOMAX) 0.4 MG CAPS capsule Take 1 capsule (0.4 mg total) by mouth daily. 04/25/14  Yes Renato Shin, MD  colchicine 0.6 MG tablet Take 2 tabs by mouth then 1 tab 6 hours later for acute attack. Start 1 tab daily 12 hours after resolution of flare. 10/08/14   Waldemar Dickens, MD  hydrocodone-ibuprofen (VICOPROFEN) 5-200 MG per tablet Take 1 tablet by mouth 4 (four) times daily.    Historical Provider, MD  polyethylene glycol-electrolytes (NULYTELY/GOLYTELY) 420 G solution Take  4,000 mLs by mouth once. 10/08/14   Waldemar Dickens, MD  predniSONE (DELTASONE) 50 MG tablet Take 1 tablet (50 mg total) by mouth daily. 04/07/15   Konrad Felix, PA   Meds Ordered and Administered this Visit   Medications  triamcinolone acetonide (KENALOG-40) injection 40 mg (40 mg Intramuscular Given 04/07/15 1842)  bupivacaine (MARCAINE) 0.25 % (with pres) injection 10 mL (10 mLs Infiltration Given 04/07/15 1843)    BP 121/73 mmHg  Pulse 79  Temp(Src) 98.4 F (36.9 C) (Oral)  Resp 18  SpO2 99% No data found.   Physical Exam  Constitutional: He is oriented to person, place, and time. He appears well-developed and well-nourished.  HENT:  Head: Normocephalic and atraumatic.  Pulmonary/Chest: Effort normal.  Musculoskeletal: He exhibits tenderness.       Right shoulder: He exhibits decreased range of motion, tenderness and pain. He exhibits no effusion, no deformity and no laceration.       Arms: Neurological: He is alert and oriented to person, place, and time.  Skin: Skin is warm and dry.  Psychiatric: He has a normal mood and affect. His behavior is normal.  Nursing note and vitals reviewed.   ED Course  Procedures (including critical care time)  Labs Review Labs Reviewed - No data to display  Imaging Review Dg Shoulder Right  04/07/2015  CLINICAL DATA:  Pain for 2 days.  No known injury. EXAM: RIGHT SHOULDER - 2+ VIEW COMPARISON:  01/26/2013 FINDINGS: Degenerative changes in the acromioclavicular joint. No evidence of acute fracture or dislocation of the right shoulder. No focal bone lesion or bone destruction. Bone cortex appears intact. Soft tissue calcifications seen previously over the supraspinatus region is not identified today, possibly due to resorption or possibly obscured by positioning. Soft tissues are unremarkable. IMPRESSION: Degenerative changes in the acromioclavicular joint. No acute bony abnormalities. Electronically Signed   By: Lucienne Capers M.D.   On:  04/07/2015 18:40     Visual Acuity Review  Right Eye Distance:   Left Eye Distance:   Bilateral Distance:    Right Eye Near:   Left Eye Near:    Bilateral Near:       RX Prednisone Tx Kenalog/marcine with some improvement of shoulder motion.   MDM   1. Tendonitis, calcific, shoulder, right     Patient is reassured that there are no issues that require transfer to higher level of care at this time or additional tests. Patient is advised to continue home symptomatic treatment. Patient is advised that if there are new or worsening symptoms to attend the emergency department, contact primary care provider, or return to UC. Instructions of care provided discharged home in stable condition. Return to work/school note provided.   THIS NOTE WAS GENERATED USING A VOICE RECOGNITION SOFTWARE PROGRAM. ALL REASONABLE EFFORTS  WERE MADE TO PROOFREAD THIS DOCUMENT FOR ACCURACY.  I have verbally reviewed the discharge instructions with the patient. A printed AVS was given to the patient.  All questions were answered prior to discharge.      Konrad Felix, [place] 04/07/15 [phone]

## 2015-05-24 ENCOUNTER — Other Ambulatory Visit: Payer: Self-pay | Admitting: Endocrinology

## 2015-07-03 ENCOUNTER — Encounter (HOSPITAL_COMMUNITY): Payer: Self-pay | Admitting: Emergency Medicine

## 2015-07-03 ENCOUNTER — Ambulatory Visit (HOSPITAL_COMMUNITY)
Admission: EM | Admit: 2015-07-03 | Discharge: 2015-07-03 | Disposition: A | Discharge status: Home / Self Care | Payer: BLUE CROSS/BLUE SHIELD | Attending: Emergency Medicine | Admitting: Emergency Medicine

## 2015-07-03 DIAGNOSIS — M7581 Other shoulder lesions, right shoulder: Secondary | ICD-10-CM

## 2015-07-03 MED ORDER — METHYLPREDNISOLONE ACETATE 40 MG/ML IJ SUSP
INTRAMUSCULAR | Status: AC
  Filled 2015-07-03: qty 1

## 2015-07-03 MED ORDER — LIDOCAINE HCL (PF) 2 % IJ SOLN
INTRAMUSCULAR | Status: AC
  Filled 2015-07-03: qty 2

## 2015-07-03 MED ORDER — HYDROCODONE-ACETAMINOPHEN 5-325 MG PO TABS: 1.0000 | ORAL_TABLET | Freq: Four times a day (QID) | ORAL | Status: DC | PRN

## 2015-07-03 NOTE — ED Provider Notes (Signed)
CSN: [id number]     Arrival date & time 07/03/15  1553 History   First MD Initiated Contact with Patient 07/03/15 1701     Chief Complaint  Patient presents with  . Shoulder Pain   (Consider location/radiation/quality/duration/timing/severity/associated sxs/prior Treatment) HPI  He is a 60 year old man here for evaluation of right shoulder pain. This is been an intermittent problem for the last 6 months. He denies any specific injury or trauma, but does state this started after his knee operation. His orthopedic doctor, Dr. Ninfa Linden, gave him a cortisone injection about 6 months ago. This seemed to work well, but pain recurred about 3 months ago. At that time, he was seen here at the urgent care and treated with another cortisone injection. He had x-rays at that time that showed some degenerative changes, but no acute abnormality. The injection worked well up until about 4 days ago. The pain is located in the posterior shoulder and will radiate to his elbow. It is worse with certain movements and positions. He denies any limitation in range of motion. He has been taking anti-inflammatories with some improvement initially, but now not so much.  He does have an appointment with his orthopedic doctor next month.  Past Medical History  Diagnosis Date  . Gout   . Diabetes mellitus without complication ([place])   . GERD (gastroesophageal reflux disease)   . Arthritis   . H/O urinary frequency     as noted in H&P per Dr Loanne Drilling 04/25/2014   . Asthma     hx of in childhood   Past Surgical History  Procedure Laterality Date  . Upper gi endoscopy    . Total knee arthroplasty Right 06/15/2014    Procedure: RIGHT TOTAL KNEE ARTHROPLASTY;  Surgeon: Mcarthur Rossetti, MD;  Location: [facility];  Service: Orthopedics;  Laterality: Right;  . Eye surgery      laser surgery bilat   . Joint replacement     Family History  Problem Relation Age of Onset  . Healthy Mother   . Healthy Father    Social  History  Substance Use Topics  . Smoking status: Never Smoker   . Smokeless tobacco: Never Used  . Alcohol Use: Yes     Comment: occasional    Review of Systems As in history of present illness Allergies  Amoxicillin; Aspirin; Penicillins; and Allopurinol  Home Medications   Prior to Admission medications   Medication Sig Start Date End Date Taking? Authorizing Provider  tamsulosin (FLOMAX) 0.4 MG CAPS capsule Take 1 capsule (0.4 mg total) by mouth daily. 04/25/14  Yes Renato Shin, MD  colchicine 0.6 MG tablet Take 2 tabs by mouth then 1 tab 6 hours later for acute attack. Start 1 tab daily 12 hours after resolution of flare. 10/08/14   Waldemar Dickens, MD  HYDROcodone-acetaminophen (NORCO) 5-325 MG tablet Take 1 tablet by mouth every 6 (six) hours as needed for moderate pain or severe pain. 07/03/15   Melony Overly, MD  polyethylene glycol-electrolytes (NULYTELY/GOLYTELY) 420 G solution Take 4,000 mLs by mouth once. 10/08/14   Waldemar Dickens, MD  pravastatin (PRAVACHOL) 40 MG tablet TAKE 1 TABLET BY MOUTH DAILY 01/07/15   Golden Circle, FNP  predniSONE (DELTASONE) 50 MG tablet Take 1 tablet (50 mg total) by mouth daily. 04/07/15   Konrad Felix, PA   Meds Ordered and Administered this Visit  Medications - No data to display  BP 118/70 mmHg  Pulse 58  Temp(Src) 97.8  F (36.6 C) (Oral)  SpO2 96% No data found.   Physical Exam  Constitutional: He is oriented to person, place, and time. He appears well-developed and well-nourished. No distress.  Cardiovascular: Normal rate.   Pulmonary/Chest: Effort normal.  Musculoskeletal:  Right shoulder: No erythema or edema. Full active range of motion. No bony tenderness. He is tender over the infraspinatus. Negative empty can, but positive [place]. 2+ radial pulse. Right elbow: No erythema or edema. No point tenderness. Full active range of motion.  Neurological: He is alert and oriented to person, place, and time.    ED Course   Injection of joint Date/Time: 07/03/2015 5:27 PM Performed by: Melony Overly Authorized by: Melony Overly Consent: Verbal consent obtained. Risks and benefits: risks, benefits and alternatives were discussed Consent given by: patient Patient understanding: patient states understanding of the procedure being performed Patient identity confirmed: verbally with patient Time out: Immediately prior to procedure a "time out" was called to verify the correct patient, procedure, equipment, support staff and site/side marked as required. Comments: Skin was cleaned with alcohol. Cold spray was used for anesthetic. 40 mg of Depo-Medrol with 4 mL of 2% lidocaine was injected into the right shoulder. Patient tolerated procedure well with no immediate complication.   (including critical care time)  Labs Review Labs Reviewed - No data to display  Imaging Review No results found.    MDM   1. Rotator cuff tendonitis, right    Repeated cortisone injection today. Prescription given for hydrocodone to use as needed for severe pain. He will continue to use ibuprofen as needed. Follow-up with orthopedics as scheduled next month.    Melony Overly, MD 07/03/15 1729

## 2015-07-03 NOTE — Discharge Instructions (Signed)
Your pain is coming from your rotator cuff tendons. We did the cortisone injection today. Rest your shoulder and apply ice tonight. You can use ibuprofen as needed for pain. Use the hydrocodone every 4-6 hours as needed for severe pain. Do not drive taking this medicine. Follow-up with Dr. Ninfa Linden as scheduled in July.

## 2015-07-03 NOTE — ED Notes (Signed)
The patient presented to the [facility] with a complaint of right shoulder pain that radiates to his elbow. The patient stated that this has been a recurring problem for about 6 months.

## 2015-07-04 DIAGNOSIS — Y999 Unspecified external cause status: Secondary | ICD-10-CM | POA: Insufficient documentation

## 2015-07-04 DIAGNOSIS — S46001A Unspecified injury of muscle(s) and tendon(s) of the rotator cuff of right shoulder, initial encounter: Secondary | ICD-10-CM | POA: Insufficient documentation

## 2015-07-04 DIAGNOSIS — X58XXXA Exposure to other specified factors, initial encounter: Secondary | ICD-10-CM | POA: Diagnosis not present

## 2015-07-04 DIAGNOSIS — Y929 Unspecified place or not applicable: Secondary | ICD-10-CM | POA: Diagnosis not present

## 2015-07-04 DIAGNOSIS — E119 Type 2 diabetes mellitus without complications: Secondary | ICD-10-CM | POA: Insufficient documentation

## 2015-07-04 DIAGNOSIS — Y939 Activity, unspecified: Secondary | ICD-10-CM | POA: Insufficient documentation

## 2015-07-04 DIAGNOSIS — M1990 Unspecified osteoarthritis, unspecified site: Secondary | ICD-10-CM | POA: Insufficient documentation

## 2015-07-04 DIAGNOSIS — S4991XA Unspecified injury of right shoulder and upper arm, initial encounter: Secondary | ICD-10-CM | POA: Diagnosis present

## 2015-07-04 DIAGNOSIS — J45909 Unspecified asthma, uncomplicated: Secondary | ICD-10-CM | POA: Insufficient documentation

## 2015-07-05 ENCOUNTER — Encounter (HOSPITAL_BASED_OUTPATIENT_CLINIC_OR_DEPARTMENT_OTHER): Payer: Self-pay | Admitting: *Deleted

## 2015-07-05 ENCOUNTER — Emergency Department (HOSPITAL_BASED_OUTPATIENT_CLINIC_OR_DEPARTMENT_OTHER)
Admission: EM | Admit: 2015-07-05 | Discharge: 2015-07-05 | Disposition: A | Discharge status: Home / Self Care | Payer: BLUE CROSS/BLUE SHIELD | Attending: Emergency Medicine | Admitting: Emergency Medicine

## 2015-07-05 ENCOUNTER — Emergency Department (HOSPITAL_BASED_OUTPATIENT_CLINIC_OR_DEPARTMENT_OTHER): Payer: BLUE CROSS/BLUE SHIELD

## 2015-07-05 DIAGNOSIS — S46001A Unspecified injury of muscle(s) and tendon(s) of the rotator cuff of right shoulder, initial encounter: Secondary | ICD-10-CM

## 2015-07-05 MED ORDER — NAPROXEN 500 MG PO TABS: 500.0000 mg | ORAL_TABLET | Freq: Two times a day (BID) | ORAL | Status: DC

## 2015-07-05 MED ORDER — OXYCODONE-ACETAMINOPHEN 7.5-325 MG PO TABS: 1.0000 | ORAL_TABLET | ORAL | Status: DC | PRN

## 2015-07-05 MED ORDER — OXYCODONE-ACETAMINOPHEN 5-325 MG PO TABS
1.0000 | ORAL_TABLET | Freq: Once | ORAL | Status: AC
  Administered 2015-07-05: 1 via ORAL
  Filled 2015-07-05: qty 1

## 2015-07-05 MED ORDER — IBUPROFEN 400 MG PO TABS
400.0000 mg | ORAL_TABLET | Freq: Once | ORAL | Status: AC
  Administered 2015-07-05: 400 mg via ORAL
  Filled 2015-07-05: qty 1

## 2015-07-05 NOTE — ED Provider Notes (Signed)
CSN: [id number]     Arrival date & time 07/04/15  2356 History   First MD Initiated Contact with Patient 07/05/15 0232     Chief Complaint  Patient presents with  . Shoulder Pain     (Consider location/radiation/quality/duration/timing/severity/associated sxs/prior Treatment) Patient is a 60 y.o. male presenting with shoulder pain. The history is provided by the patient.  Shoulder Pain He has been having pain in his right shoulder for the last 4 days. Pain is sharp and radiates down his arm. There is no radiation to the neck. He rates his pain at 8/10. He denies any, denies any overhead work and denies repetitive use injury. Neck she has been having some pain in that shoulder intermittently since knee replacement surgery last year. He was seen at urgent care yesterday and received a cortisone injection, but pain actually got worse following that. He never had even minimal relief. He has been taking ibuprofen and hydrocodone-acetaminophen without relief. There is no weakness, numbness, tingling.  Past Medical History  Diagnosis Date  . Gout   . Diabetes mellitus without complication ([facility])   . GERD (gastroesophageal reflux disease)   . Arthritis   . H/O urinary frequency     as noted in H&P per Dr Loanne Drilling 04/25/2014   . Asthma     hx of in childhood   Past Surgical History  Procedure Laterality Date  . Upper gi endoscopy    . Total knee arthroplasty Right 06/15/2014    Procedure: RIGHT TOTAL KNEE ARTHROPLASTY;  Surgeon: Mcarthur Rossetti, MD;  Location: [facility];  Service: Orthopedics;  Laterality: Right;  . Eye surgery      laser surgery bilat   . Joint replacement     Family History  Problem Relation Age of Onset  . Healthy Mother   . Healthy Father    Social History  Substance Use Topics  . Smoking status: Never Smoker   . Smokeless tobacco: Never Used  . Alcohol Use: Yes     Comment: occasional    Review of Systems  All other systems reviewed and are  negative.     Allergies  Amoxicillin; Aspirin; Penicillins; and Allopurinol  Home Medications   Prior to Admission medications   Medication Sig Start Date End Date Taking? Authorizing Provider  colchicine 0.6 MG tablet Take 2 tabs by mouth then 1 tab 6 hours later for acute attack. Start 1 tab daily 12 hours after resolution of flare. 10/08/14   Waldemar Dickens, MD  HYDROcodone-acetaminophen (NORCO) 5-325 MG tablet Take 1 tablet by mouth every 6 (six) hours as needed for moderate pain or severe pain. 07/03/15   Melony Overly, MD  tamsulosin (FLOMAX) 0.4 MG CAPS capsule Take 1 capsule (0.4 mg total) by mouth daily. 04/25/14   Renato Shin, MD   BP 139/80 mmHg  Pulse 64  Temp(Src) 97.8 F (36.6 C) (Oral)  Resp 20  Ht 5\' 8"  (1.727 m)  Wt 285 lb (129.275 kg)  BMI 43.34 kg/m2  SpO2 97% Physical Exam  Nursing note and vitals reviewed.  60 year old male, who appears uncomfortable, but is in no acute distress. Vital signs are normal. Oxygen saturation is 97%, which is normal. Head is normocephalic and atraumatic. PERRLA, EOMI. Oropharynx is clear. Neck is nontender and supple without adenopathy or JVD. Back is nontender and there is no CVA tenderness. Lungs are clear without rales, wheezes, or rhonchi. Chest is nontender. Heart has regular rate and rhythm without murmur. Abdomen is  soft, flat, nontender without masses or hepatosplenomegaly and peristalsis is normoactive. Extremities: There is no swelling or deformity of the right shoulder. There is marked tenderness of the anterior and posterior deltoid groove on the right. Full passive range of motion is present. Rotator cuff impingement signs are present. Distal neurovascular exam is intact with strong pulses, prompt capillary refill, normal sensation, normal strength of all muscles. He does have trace pitting edema bilaterally. Skin is warm and dry without rash. Neurologic: Mental status is normal, cranial nerves are intact, there are no  motor or sensory deficits.  ED Course  Procedures (including critical care time)  Imaging Review Dg Shoulder Right  07/05/2015  CLINICAL DATA:  Acute onset of right shoulder pain. Initial encounter. EXAM: RIGHT SHOULDER - 2+ VIEW COMPARISON:  Right shoulder radiographs performed 04/07/2015 FINDINGS: There is no evidence of fracture or dislocation. The right humeral head is seated within the glenoid fossa. Degenerative change at the right acromioclavicular joint is better characterized on the prior study. No significant soft tissue abnormalities are seen. The visualized portions of the right lung are clear. IMPRESSION: No evidence of fracture or dislocation. Electronically Signed   By: Garald Balding M.D.   On: 07/05/2015 02:18   I have personally reviewed and evaluated these images as part of my medical decision-making.   MDM   Final diagnoses:  Rotator cuff injury, right, initial encounter    Right shoulder pain which appears to be a rotator cuff injury. Old records are reviewed confirming urgent care visit yesterday for steroid injection and, also, urgent care visit about 3 months ago for similar issues. He has failed to get relief with hydrocodone-acetaminophen. Will try him on oxycodone-acetaminophen. Also given prescription for naproxen. He has an appointment with his orthopedic surgeon in 2 weeks and needs to keep that appointment.    Delora Fuel, MD [id number]

## 2015-07-05 NOTE — Discharge Instructions (Signed)
Rotator Cuff Injury °Rotator cuff injury is any type of injury to the set of muscles and tendons that make up the stabilizing unit of your shoulder. This unit holds the ball of your upper arm bone (humerus) in the socket of your shoulder blade (scapula).  °CAUSES °Injuries to your rotator cuff most commonly come from sports or activities that cause your arm to be moved repeatedly over your head. Examples of this include throwing, weight lifting, swimming, or racquet sports. Long lasting (chronic) irritation of your rotator cuff can cause soreness and swelling (inflammation), bursitis, and eventual damage to your tendons, such as a tear (rupture). °SIGNS AND SYMPTOMS °Acute rotator cuff tear: °· Sudden tearing sensation followed by severe pain shooting from your upper shoulder down your arm toward your elbow. °· Decreased range of motion of your shoulder because of pain and muscle spasm. °· Severe pain. °· Inability to raise your arm out to the side because of pain and loss of muscle power (large tears). °Chronic rotator cuff tear: °· Pain that usually is worse at night and may interfere with sleep. °· Gradual weakness and decreased shoulder motion as the pain worsens. °· Decreased range of motion. °Rotator cuff tendinitis:  °· Deep ache in your shoulder and the outside upper arm over your shoulder. °· Pain that comes on gradually and becomes worse when lifting your arm to the side or turning it inward. °DIAGNOSIS °Rotator cuff injury is diagnosed through a medical history, physical exam, and imaging exam. The medical history helps determine the type of rotator cuff injury. Your health care provider will look at your injured shoulder, feel the injured area, and ask you to move your shoulder in different positions. X-ray exams typically are done to rule out other causes of shoulder pain, such as fractures. MRI is the exam of choice for the most severe shoulder injuries because the images show muscles and tendons.    °TREATMENT  °Chronic tear: °· Medicine for pain, such as acetaminophen or ibuprofen. °· Physical therapy and range-of-motion exercises may be helpful in maintaining shoulder function and strength. °· Steroid injections into your shoulder joint. °· Surgical repair of the rotator cuff if the injury does not heal with noninvasive treatment. °Acute tear: °· Anti-inflammatory medicines such as ibuprofen and naproxen to help reduce pain and swelling. °· A sling to help support your arm and rest your rotator cuff muscles. Long-term use of a sling is not advised. It may cause significant stiffening of the shoulder joint. °· Surgery may be considered within a few weeks, especially in younger, active people, to return the shoulder to full function. °· Indications for surgical treatment include the following: °¨ Age younger than 60 years. °¨ Rotator cuff tears that are complete. °¨ Physical therapy, rest, and anti-inflammatory medicines have been used for 6-8 weeks, with no improvement. °¨ Employment or sporting activity that requires constant shoulder use. °Tendinitis: °· Anti-inflammatory medicines such as ibuprofen and naproxen to help reduce pain and swelling. °· A sling to help support your arm and rest your rotator cuff muscles. Long-term use of a sling is not advised. It may cause significant stiffening of the shoulder joint. °· Severe tendinitis may require: °¨ Steroid injections into your shoulder joint. °¨ Physical therapy. °¨ Surgery. °HOME CARE INSTRUCTIONS  °· Apply ice to your injury: °¨ Put ice in a plastic bag. °¨ Place a towel between your skin and the bag. °¨ Leave the ice on for 20 minutes, 2-3 times a day. °· If you   have a shoulder immobilizer (sling and straps), wear it until told otherwise by your health care provider.  You may want to sleep on several pillows or in a recliner at night to lessen swelling and pain.  Only take over-the-counter or prescription medicines for pain, discomfort, or fever as  directed by your health care provider.  Do simple hand squeezing exercises with a soft rubber ball to decrease hand swelling. SEEK MEDICAL CARE IF:   Your shoulder pain increases, or new pain or numbness develops in your arm, hand, or fingers.  Your hand or fingers are colder than your other hand. SEEK IMMEDIATE MEDICAL CARE IF:   Your arm, hand, or fingers are numb or tingling.  Your arm, hand, or fingers are increasingly swollen and painful, or they turn white or blue. MAKE SURE YOU:  Understand these instructions.  Will watch your condition.  Will get help right away if you are not doing well or get worse.   This information is not intended to replace advice given to you by your health care provider. Make sure you discuss any questions you have with your health care provider.   Document Released: 01/03/2000 Document Revised: 01/10/2013 Document Reviewed: 08/17/2012 Elsevier Interactive Patient Education 2016 [employer]  Naproxen and naproxen sodium oral immediate-release tablets What is this medicine? NAPROXEN (na PROX en) is a non-steroidal anti-inflammatory drug (NSAID). It is used to reduce swelling and to treat pain. This medicine may be used for dental pain, headache, or painful monthly periods. It is also used for painful joint and muscular problems such as arthritis, tendinitis, bursitis, and gout. This medicine may be used for other purposes; ask your health care provider or pharmacist if you have questions. What should I tell my health care provider before I take this medicine? They need to know if you have any of these conditions: -asthma -cigarette smoker -drink more than 3 alcohol containing drinks a day -heart disease or circulation problems such as heart failure or leg edema (fluid retention) -high blood pressure -kidney disease -liver disease -stomach bleeding or ulcers -an unusual or allergic reaction to naproxen, aspirin, other NSAIDs, other medicines,  foods, dyes, or preservatives -pregnant or trying to get pregnant -breast-feeding How should I use this medicine? Take this medicine by mouth with a glass of water. Follow the directions on the prescription label. Take it with food if your stomach gets upset. Try to not lie down for at least 10 minutes after you take it. Take your medicine at regular intervals. Do not take your medicine more often than directed. Long-term, continuous use may increase the risk of heart attack or stroke. A special MedGuide will be given to you by the pharmacist with each prescription and refill. Be sure to read this information carefully each time. Talk to your pediatrician regarding the use of this medicine in children. Special care may be needed. Overdosage: If you think you have taken too much of this medicine contact a poison control center or emergency room at once. NOTE: This medicine is only for you. Do not share this medicine with others. What if I miss a dose? If you miss a dose, take it as soon as you can. If it is almost time for your next dose, take only that dose. Do not take double or extra doses. What may interact with this medicine? -alcohol -aspirin -cidofovir -diuretics -lithium -methotrexate -other drugs for inflammation like ketorolac or prednisone -pemetrexed -probenecid -warfarin This list may not describe all possible interactions.  Give your health care provider a list of all the medicines, herbs, non-prescription drugs, or dietary supplements you use. Also tell them if you smoke, drink alcohol, or use illegal drugs. Some items may interact with your medicine. What should I watch for while using this medicine? Tell your doctor or health care professional if your pain does not get better. Talk to your doctor before taking another medicine for pain. Do not treat yourself. This medicine does not prevent heart attack or stroke. In fact, this medicine may increase the chance of a heart attack  or stroke. The chance may increase with longer use of this medicine and in people who have heart disease. If you take aspirin to prevent heart attack or stroke, talk with your doctor or health care professional. Do not take other medicines that contain aspirin, ibuprofen, or naproxen with this medicine. Side effects such as stomach upset, nausea, or ulcers may be more likely to occur. Many medicines available without a prescription should not be taken with this medicine. This medicine can cause ulcers and bleeding in the stomach and intestines at any time during treatment. Do not smoke cigarettes or drink alcohol. These increase irritation to your stomach and can make it more susceptible to damage from this medicine. Ulcers and bleeding can happen without warning symptoms and can cause death. You may get drowsy or dizzy. Do not drive, use machinery, or do anything that needs mental alertness until you know how this medicine affects you. Do not stand or sit up quickly, especially if you are an older patient. This reduces the risk of dizzy or fainting spells. This medicine can cause you to bleed more easily. Try to avoid damage to your teeth and gums when you brush or floss your teeth. What side effects may I notice from receiving this medicine? Side effects that you should report to your doctor or health care professional as soon as possible: -black or bloody stools, blood in the urine or vomit -blurred vision -chest pain -difficulty breathing or wheezing -nausea or vomiting -severe stomach pain -skin rash, skin redness, blistering or peeling skin, hives, or itching -slurred speech or weakness on one side of the body -swelling of eyelids, throat, lips -unexplained weight gain or swelling -unusually weak or tired -yellowing of eyes or skin Side effects that usually do not require medical attention (report to your doctor or health care professional if they continue or are  bothersome): -constipation -headache -heartburn This list may not describe all possible side effects. Call your doctor for medical advice about side effects. You may report side effects to FDA at 1-800-FDA-1088. Where should I keep my medicine? Keep out of the reach of children. Store at room temperature between 15 and 30 degrees C (59 and 86 degrees F). Keep container tightly closed. Throw away any unused medicine after the expiration date. NOTE: This sheet is a summary. It may not cover all possible information. If you have questions about this medicine, talk to your doctor, pharmacist, or health care provider.    2016, Elsevier/Gold Standard. (2009-01-07 20:10:16)  Acetaminophen; Oxycodone tablets What is this medicine? ACETAMINOPHEN; OXYCODONE (a set a MEE noe fen; ox i KOE done) is a pain reliever. It is used to treat moderate to severe pain. This medicine may be used for other purposes; ask your health care provider or pharmacist if you have questions. What should I tell my health care provider before I take this medicine? They need to know if you have any of  these conditions: -brain tumor -Crohn's disease, inflammatory bowel disease, or ulcerative colitis -drug abuse or addiction -head injury -heart or circulation problems -if you often drink alcohol -kidney disease or problems going to the bathroom -liver disease -lung disease, asthma, or breathing problems -an unusual or allergic reaction to acetaminophen, oxycodone, other opioid analgesics, other medicines, foods, dyes, or preservatives -pregnant or trying to get pregnant -breast-feeding How should I use this medicine? Take this medicine by mouth with a full glass of water. Follow the directions on the prescription label. You can take it with or without food. If it upsets your stomach, take it with food. Take your medicine at regular intervals. Do not take it more often than directed. Talk to your pediatrician regarding the  use of this medicine in children. Special care may be needed. Patients over 46 years old may have a stronger reaction and need a smaller dose. Overdosage: If you think you have taken too much of this medicine contact a poison control center or emergency room at once. NOTE: This medicine is only for you. Do not share this medicine with others. What if I miss a dose? If you miss a dose, take it as soon as you can. If it is almost time for your next dose, take only that dose. Do not take double or extra doses. What may interact with this medicine? -alcohol -antihistamines -barbiturates like amobarbital, butalbital, butabarbital, methohexital, pentobarbital, phenobarbital, thiopental, and secobarbital -benztropine -drugs for bladder problems like solifenacin, trospium, oxybutynin, tolterodine, hyoscyamine, and methscopolamine -drugs for breathing problems like ipratropium and tiotropium -drugs for certain stomach or intestine problems like propantheline, homatropine methylbromide, glycopyrrolate, atropine, belladonna, and dicyclomine -general anesthetics like etomidate, ketamine, nitrous oxide, propofol, desflurane, enflurane, halothane, isoflurane, and sevoflurane -medicines for depression, anxiety, or psychotic disturbances -medicines for sleep -muscle relaxants -naltrexone -narcotic medicines (opiates) for pain -phenothiazines like perphenazine, thioridazine, chlorpromazine, mesoridazine, fluphenazine, prochlorperazine, promazine, and trifluoperazine -scopolamine -tramadol -trihexyphenidyl This list may not describe all possible interactions. Give your health care provider a list of all the medicines, herbs, non-prescription drugs, or dietary supplements you use. Also tell them if you smoke, drink alcohol, or use illegal drugs. Some items may interact with your medicine. What should I watch for while using this medicine? Tell your doctor or health care professional if your pain does not go  away, if it gets worse, or if you have new or a different type of pain. You may develop tolerance to the medicine. Tolerance means that you will need a higher dose of the medication for pain relief. Tolerance is normal and is expected if you take this medicine for a long time. Do not suddenly stop taking your medicine because you may develop a severe reaction. Your body becomes used to the medicine. This does NOT mean you are addicted. Addiction is a behavior related to getting and using a drug for a non-medical reason. If you have pain, you have a medical reason to take pain medicine. Your doctor will tell you how much medicine to take. If your doctor wants you to stop the medicine, the dose will be slowly lowered over time to avoid any side effects. You may get drowsy or dizzy. Do not drive, use machinery, or do anything that needs mental alertness until you know how this medicine affects you. Do not stand or sit up quickly, especially if you are an older patient. This reduces the risk of dizzy or fainting spells. Alcohol may interfere with the effect of this medicine. Avoid  alcoholic drinks. There are different types of narcotic medicines (opiates) for pain. If you take more than one type at the same time, you may have more side effects. Give your health care provider a list of all medicines you use. Your doctor will tell you how much medicine to take. Do not take more medicine than directed. Call emergency for help if you have problems breathing. The medicine will cause constipation. Try to have a bowel movement at least every 2 to 3 days. If you do not have a bowel movement for 3 days, call your doctor or health care professional. Do not take Tylenol (acetaminophen) or medicines that have acetaminophen with this medicine. Too much acetaminophen can be very dangerous. Many nonprescription medicines contain acetaminophen. Always read the labels carefully to avoid taking more acetaminophen. What side effects  may I notice from receiving this medicine? Side effects that you should report to your doctor or health care professional as soon as possible: -allergic reactions like skin rash, itching or hives, swelling of the face, lips, or tongue -breathing difficulties, wheezing -confusion -light headedness or fainting spells -severe stomach pain -unusually weak or tired -yellowing of the skin or the whites of the eyes Side effects that usually do not require medical attention (report to your doctor or health care professional if they continue or are bothersome): -dizziness -drowsiness -nausea -vomiting This list may not describe all possible side effects. Call your doctor for medical advice about side effects. You may report side effects to FDA at 1-800-FDA-1088. Where should I keep my medicine? Keep out of the reach of children. This medicine can be abused. Keep your medicine in a safe place to protect it from theft. Do not share this medicine with anyone. Selling or giving away this medicine is dangerous and against the law. This medicine may cause accidental overdose and death if it taken by other adults, children, or pets. Mix any unused medicine with a substance like cat litter or coffee grounds. Then throw the medicine away in a sealed container like a sealed bag or a coffee can with a lid. Do not use the medicine after the expiration date. Store at room temperature between 20 and 25 degrees C (68 and 77 degrees F). NOTE: This sheet is a summary. It may not cover all possible information. If you have questions about this medicine, talk to your doctor, pharmacist, or health care provider.    2016, Elsevier/Gold Standard. (2013-12-06 15:18:46)

## 2015-07-05 NOTE — ED Notes (Signed)
Pt. Reports pain started in the R shoulder last Thurs or Friday.  Pt. Reports seeing a Dr. At Urgent care yesterday and getting an injection in the R shoulder.  Pain got worse today after the cortisone shot.  Pt. In no distress with no SHOB or chest pain.  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  Pt. Has no c/o numbness only c/o of a throbbing pain in the R shoulder. Constant ache.

## 2015-07-10 ENCOUNTER — Other Ambulatory Visit: Payer: Self-pay | Admitting: Orthopaedic Surgery

## 2015-07-10 DIAGNOSIS — M542 Cervicalgia: Secondary | ICD-10-CM

## 2015-07-17 ENCOUNTER — Ambulatory Visit
Admission: RE | Admit: 2015-07-17 | Discharge: 2015-07-17 | Disposition: A | Discharge status: Home / Self Care | Payer: BLUE CROSS/BLUE SHIELD | Source: Ambulatory Visit | Attending: Orthopaedic Surgery | Admitting: Orthopaedic Surgery

## 2015-07-17 DIAGNOSIS — M542 Cervicalgia: Secondary | ICD-10-CM

## 2015-10-14 ENCOUNTER — Encounter: Payer: BLUE CROSS/BLUE SHIELD | Admitting: Family

## 2015-10-31 ENCOUNTER — Encounter: Payer: Self-pay | Admitting: Family

## 2015-10-31 ENCOUNTER — Ambulatory Visit (INDEPENDENT_AMBULATORY_CARE_PROVIDER_SITE_OTHER): Payer: BLUE CROSS/BLUE SHIELD | Admitting: Family

## 2015-10-31 VITALS — BP 112/62 | HR 69 | Temp 98.5°F | Resp 16 | Ht 68.0 in | Wt 270.8 lb

## 2015-10-31 DIAGNOSIS — Z0000 Encounter for general adult medical examination without abnormal findings: Secondary | ICD-10-CM

## 2015-10-31 DIAGNOSIS — E119 Type 2 diabetes mellitus without complications: Secondary | ICD-10-CM | POA: Diagnosis not present

## 2015-10-31 DIAGNOSIS — Z23 Encounter for immunization: Secondary | ICD-10-CM

## 2015-10-31 DIAGNOSIS — Z9189 Other specified personal risk factors, not elsewhere classified: Secondary | ICD-10-CM

## 2015-10-31 MED ORDER — COLCHICINE 0.6 MG PO TABS: ORAL_TABLET | ORAL | 0 refills | Status: DC

## 2015-10-31 MED ORDER — TAMSULOSIN HCL 0.4 MG PO CAPS: 0.4000 mg | ORAL_CAPSULE | Freq: Every day | ORAL | 3 refills | Status: DC

## 2015-10-31 NOTE — Assessment & Plan Note (Signed)
1) Anticipatory Guidance: Discussed importance of wearing a seatbelt while driving and not texting while driving; changing batteries in smoke detector at least once annually; wearing suntan lotion when outside; eating a balanced and moderate diet; getting physical activity at least 30 minutes per day.  2) Immunizations / Screenings / Labs:  Influenza updated today. All other immunizations are up-to-date per recommendations. Due for dental exam encouraged to be completed independently. Obtain hepatitis C antibody for hepatitis C screening. Due for colon cancer screening with referral to gastroenterology placed for colonoscopy. Obtain hemoglobin A1c and urine microalbumin for diabetes screening. Diabetic foot exam completed today. Patient requests screening for low testosterone. Due for prostate cancer screening with PSA ordered. All other screenings are up-to-date per recommendations. Obtain CBC, CMET, and lipid profile.    Overall well exam with risk factors for cardiovascular disease including hyperlipidemia, obesity, and diabetes. Chronic conditions appear adequately controlled through medication regimens and lifestyle management. Changes pending blood work results. Continue healthy lifestyle behaviors and choices. Recommended weight loss of 5-10% of current body weight through continue nutrition and physical activity changes. Follow-up prevention exam in 1 year. Follow-up office visit for chronic conditions pending blood work.

## 2015-10-31 NOTE — Patient Instructions (Addendum)
Thank you for choosing [facility].  SUMMARY AND INSTRUCTIONS:  Medication:  Your prescription(s) have been submitted to your pharmacy or been printed and provided for you. Please take as directed and contact our office if you believe you are having problem(s) with the medication(s) or have any questions.  Labs:  Please stop by the lab on the lower level of the building for your blood work. Your results will be released to [place] (or called to you) after review, usually within 72 hours after test completion. If any changes need to be made, you will be notified at that same time.  1.) The lab is open from 7:30am to 5:30 pm Monday-Friday 2.) No appointment is necessary 3.) Fasting (if needed) is 6-8 hours after food and drink; black coffee and water are okay   Health Maintenance, Male A healthy lifestyle and preventative care can promote health and wellness.  Maintain regular health, dental, and eye exams.  Eat a healthy diet. Foods like vegetables, fruits, whole grains, low-fat dairy products, and lean protein foods contain the nutrients you need and are low in calories. Decrease your intake of foods high in solid fats, added sugars, and salt. Get information about a proper diet from your health care provider, if necessary.  Regular physical exercise is one of the most important things you can do for your health. Most adults should get at least 150 minutes of moderate-intensity exercise (any activity that increases your heart rate and causes you to sweat) each week. In addition, most adults need muscle-strengthening exercises on 2 or more days a week.   Maintain a healthy weight. The body mass index (BMI) is a screening tool to identify possible weight problems. It provides an estimate of body fat based on height and weight. Your health care provider can find your BMI and can help you achieve or maintain a healthy weight. For males 20 years and older:  A BMI below 18.5 is  considered underweight.  A BMI of 18.5 to 24.9 is normal.  A BMI of 25 to 29.9 is considered overweight.  A BMI of 30 and above is considered obese.  Maintain normal blood lipids and cholesterol by exercising and minimizing your intake of saturated fat. Eat a balanced diet with plenty of fruits and vegetables. Blood tests for lipids and cholesterol should begin at age 73 and be repeated every 5 years. If your lipid or cholesterol levels are high, you are over age 44, or you are at high risk for heart disease, you may need your cholesterol levels checked more frequently.Ongoing high lipid and cholesterol levels should be treated with medicines if diet and exercise are not working.  If you smoke, find out from your health care provider how to quit. If you do not use tobacco, do not start.  Lung cancer screening is recommended for adults aged 82-80 years who are at high risk for developing lung cancer because of a history of smoking. A yearly low-dose CT scan of the lungs is recommended for people who have at least a 30-pack-year history of smoking and are current smokers or have quit within the past 15 years. A pack year of smoking is smoking an average of 1 pack of cigarettes a day for 1 year (for example, a 30-pack-year history of smoking could mean smoking 1 pack a day for 30 years or 2 packs a day for 15 years). Yearly screening should continue until the smoker has stopped smoking for at least 15 years. Yearly screening  should be stopped for people who develop a health problem that would prevent them from having lung cancer treatment.  If you choose to drink alcohol, do not have more than 2 drinks per day. One drink is considered to be 12 oz (360 mL) of beer, 5 oz (150 mL) of wine, or 1.5 oz (45 mL) of liquor.  Avoid the use of street drugs. Do not share needles with anyone. Ask for help if you need support or instructions about stopping the use of drugs.  High blood pressure causes heart  disease and increases the risk of stroke. High blood pressure is more likely to develop in:  People who have blood pressure in the end of the normal range (100-139/85-89 mm Hg).  People who are overweight or obese.  People who are African American.  If you are 29-39 years of age, have your blood pressure checked every 3-5 years. If you are 2 years of age or older, have your blood pressure checked every year. You should have your blood pressure measured twice--once when you are at a hospital or clinic, and once when you are not at a hospital or clinic. Record the average of the two measurements. To check your blood pressure when you are not at a hospital or clinic, you can use:  An automated blood pressure machine at a pharmacy.  A home blood pressure monitor.  If you are 24-38 years old, ask your health care provider if you should take aspirin to prevent heart disease.  Diabetes screening involves taking a blood sample to check your fasting blood sugar level. This should be done once every 3 years after age 41 if you are at a normal weight and without risk factors for diabetes. Testing should be considered at a younger age or be carried out more frequently if you are overweight and have at least 1 risk factor for diabetes.  Colorectal cancer can be detected and often prevented. Most routine colorectal cancer screening begins at the age of 78 and continues through age 84. However, your health care provider may recommend screening at an earlier age if you have risk factors for colon cancer. On a yearly basis, your health care provider may provide home test kits to check for hidden blood in the stool. A small camera at the end of a tube may be used to directly examine the colon (sigmoidoscopy or colonoscopy) to detect the earliest forms of colorectal cancer. Talk to your health care provider about this at age 66 when routine screening begins. A direct exam of the colon should be repeated every 5-10  years through age 31, unless early forms of precancerous polyps or small growths are found.  People who are at an increased risk for hepatitis B should be screened for this virus. You are considered at high risk for hepatitis B if:  You were born in a country where hepatitis B occurs often. Talk with your health care provider about which countries are considered high risk.  Your parents were born in a high-risk country and you have not received a shot to protect against hepatitis B (hepatitis B vaccine).  You have HIV or AIDS.  You use needles to inject street drugs.  You live with, or have sex with, someone who has hepatitis B.  You are a man who has sex with other men (MSM).  You get hemodialysis treatment.  You take certain medicines for conditions like cancer, organ transplantation, and autoimmune conditions.  Hepatitis C blood testing  is recommended for all people born from [birth year] through 1963 and any individual with known risk factors for hepatitis C.  Healthy men should no longer receive prostate-specific antigen (PSA) blood tests as part of routine cancer screening. Talk to your health care provider about prostate cancer screening.  Testicular cancer screening is not recommended for adolescents or adult males who have no symptoms. Screening includes self-exam, a health care provider exam, and other screening tests. Consult with your health care provider about any symptoms you have or any concerns you have about testicular cancer.  Practice safe sex. Use condoms and avoid high-risk sexual practices to reduce the spread of sexually transmitted infections (STIs).  You should be screened for STIs, including gonorrhea and chlamydia if:  You are sexually active and are younger than 24 years.  You are older than 24 years, and your health care provider tells you that you are at risk for this type of infection.  Your sexual activity has changed since you were last screened, and you are  at an increased risk for chlamydia or gonorrhea. Ask your health care provider if you are at risk.  If you are at risk of being infected with HIV, it is recommended that you take a prescription medicine daily to prevent HIV infection. This is called pre-exposure prophylaxis (PrEP). You are considered at risk if:  You are a man who has sex with other men (MSM).  You are a heterosexual man who is sexually active with multiple partners.  You take drugs by injection.  You are sexually active with a partner who has HIV.  Talk with your health care provider about whether you are at high risk of being infected with HIV. If you choose to begin PrEP, you should first be tested for HIV. You should then be tested every 3 months for as long as you are taking PrEP.  Use sunscreen. Apply sunscreen liberally and repeatedly throughout the day. You should seek shade when your shadow is shorter than you. Protect yourself by wearing long sleeves, pants, a wide-brimmed hat, and sunglasses year round whenever you are outdoors.  Tell your health care provider of new moles or changes in moles, especially if there is a change in shape or color. Also, tell your health care provider if a mole is larger than the size of a pencil eraser.  A one-time screening for abdominal aortic aneurysm (AAA) and surgical repair of large AAAs by ultrasound is recommended for men aged 41-75 years who are current or former smokers.  Stay current with your vaccines (immunizations).   This information is not intended to replace advice given to you by your health care provider. Make sure you discuss any questions you have with your health care provider.   Document Released: 07/04/2007 Document Revised: 01/26/2014 Document Reviewed: 06/02/2010 Elsevier Interactive Patient Education [employer].

## 2015-10-31 NOTE — Progress Notes (Signed)
Subjective:    Patient ID: David Wall, male    DOB: Nov 29, 1955, 60 y.o.   MRN: [id number]  Chief Complaint  Patient presents with  . CPE    not fasting    HPI:  David Wall is a 60 y.o. male who presents today for an annual wellness visit.   1) Health Maintenance -   Diet - Averages about 2 meals per day consisting of a regular diet; Caffeine intake of about 1-2 cups per day. Frequent fast / processed foods  Exercise - No structured exercise.    2) Preventative Exams / Immunizations:  Dental -- Due for exam   Vision -- Up to date    Health Maintenance  Topic Date Due  . Hepatitis C Screening  13-Feb-1955  . URINE MICROALBUMIN  06/03/1967  . COLONOSCOPY  06/03/2007  . HEMOGLOBIN A1C  10/25/2014  . FOOT EXAM  04/25/2015  . INFLUENZA VACCINE  08/20/2015  . OPHTHALMOLOGY EXAM  03/24/2016  . PNEUMOCOCCAL POLYSACCHARIDE VACCINE (2) 09/12/2019  . TETANUS/TDAP  10/11/2023  . HIV Screening  Completed    Immunization History  Administered Date(s) Administered  . Influenza Whole 01/19/2006  . Influenza,inj,Quad PF,36+ Mos 10/10/2013, 10/31/2015  . Pneumococcal Polysaccharide-23 09/12/2014  . Td 04/26/2008  . Tdap 10/10/2013     Allergies  Allergen Reactions  . Amoxicillin Shortness Of Breath  . Aspirin Shortness Of Breath  . Penicillins Shortness Of Breath  . Allopurinol Rash     Outpatient Medications Prior to Visit  Medication Sig Dispense Refill  . colchicine 0.6 MG tablet Take 2 tabs by mouth then 1 tab 6 hours later for acute attack. Start 1 tab daily 12 hours after resolution of flare. 30 tablet 0  . tamsulosin (FLOMAX) 0.4 MG CAPS capsule Take 1 capsule (0.4 mg total) by mouth daily. 30 capsule 3  . HYDROcodone-acetaminophen (NORCO) 5-325 MG tablet Take 1 tablet by mouth every 6 (six) hours as needed for moderate pain or severe pain. 20 tablet 0  . naproxen (NAPROSYN) 500 MG tablet Take 1 tablet (500 mg total) by mouth 2 (two) times daily. 30  tablet 0  . oxyCODONE-acetaminophen (PERCOCET) 7.5-325 MG tablet Take 1 tablet by mouth every 4 (four) hours as needed for severe pain. 20 tablet 0   No facility-administered medications prior to visit.      Past Medical History:  Diagnosis Date  . Arthritis   . Asthma    hx of in childhood  . Diabetes mellitus without complication ([place])   . GERD (gastroesophageal reflux disease)   . Gout   . H/O urinary frequency    as noted in H&P per Dr Loanne Drilling 04/25/2014      Past Surgical History:  Procedure Laterality Date  . EYE SURGERY     laser surgery bilat   . JOINT REPLACEMENT    . TOTAL KNEE ARTHROPLASTY Right 06/15/2014   Procedure: RIGHT TOTAL KNEE ARTHROPLASTY;  Surgeon: Mcarthur Rossetti, MD;  Location: [facility];  Service: Orthopedics;  Laterality: Right;  . UPPER GI ENDOSCOPY       Family History  Problem Relation Age of Onset  . Healthy Mother   . Healthy Father      Social History   Social History  . Marital status: Married    Spouse name: N/A  . Number of children: 8  . Years of education: 20   Occupational History  . Cook    Social History Main Topics  . Smoking  status: Never Smoker  . Smokeless tobacco: Never Used  . Alcohol use Yes     Comment: occasional  . Drug use: No  . Sexual activity: Not on file   Other Topics Concern  . Not on file   Social History Narrative   Fun: Go to the mountains and relax      Review of Systems  Constitutional: Denies fever, chills, fatigue, or significant weight gain/loss. HENT: Head: Denies headache or neck pain Ears: Denies changes in hearing, ringing in ears, earache, drainage Nose: Denies discharge, stuffiness, itching, nosebleed, sinus pain Throat: Denies sore throat, hoarseness, dry mouth, sores, thrush Eyes: Denies loss/changes in vision, pain, redness, blurry/double vision, flashing lights Cardiovascular: Denies chest pain/discomfort, tightness, palpitations, shortness of breath with activity,  difficulty lying down, swelling, sudden awakening with shortness of breath Respiratory: Denies shortness of breath, cough, sputum production, wheezing Gastrointestinal: Denies dysphasia, heartburn, change in appetite, nausea, change in bowel habits, rectal bleeding, constipation, diarrhea, yellow skin or eyes Genitourinary: Denies frequency, urgency, burning/pain, blood in urine, incontinence, change in urinary strength. Musculoskeletal: Denies muscle/joint pain, stiffness, back pain, redness or swelling of joints, trauma Skin: Denies rashes, lumps, itching, dryness, color changes, or hair/nail changes Neurological: Denies dizziness, fainting, seizures, weakness, numbness, tingling, tremor Psychiatric - Denies nervousness, stress, depression or memory loss Endocrine: Denies heat or cold intolerance, sweating, frequent urination, excessive thirst, changes in appetite Hematologic: Denies ease of bruising or bleeding     Objective:     BP 112/62 (BP Location: Left Arm, Patient Position: Sitting, Cuff Size: Large)   Pulse 69   Temp 98.5 F (36.9 C) (Oral)   Resp 16   Ht 5\' 8"  (1.727 m)   Wt 270 lb 12.8 oz (122.8 kg)   SpO2 97%   BMI 41.17 kg/m  Nursing note and vital signs reviewed.  Physical Exam  Constitutional: He is oriented to person, place, and time. He appears well-developed and well-nourished.  HENT:  Head: Normocephalic.  Right Ear: Hearing, tympanic membrane, external ear and ear canal normal.  Left Ear: Hearing, tympanic membrane, external ear and ear canal normal.  Nose: Nose normal.  Mouth/Throat: Uvula is midline, oropharynx is clear and moist and mucous membranes are normal.  Eyes: Conjunctivae and EOM are normal. Pupils are equal, round, and reactive to light.  Neck: Neck supple. No JVD present. No tracheal deviation present. No thyromegaly present.  Cardiovascular: Normal rate, regular rhythm, normal heart sounds and intact distal pulses.   Pulmonary/Chest: Effort  normal and breath sounds normal.  Abdominal: Soft. Bowel sounds are normal. He exhibits no distension and no mass. There is no tenderness. There is no rebound and no guarding.  Musculoskeletal: Normal range of motion. He exhibits no edema or tenderness.  Lymphadenopathy:    He has no cervical adenopathy.  Neurological: He is alert and oriented to person, place, and time. He has normal reflexes. No cranial nerve deficit. He exhibits normal muscle tone. Coordination normal.  Skin: Skin is warm and dry.  Psychiatric: He has a normal mood and affect. His behavior is normal. Judgment and thought content normal.       Assessment & Plan:   Problem List Items Addressed This Visit      Endocrine   Diabetes ([place])   Relevant Orders   Hemoglobin A1c   Urine Microalbumin w/creat. ratio     Other   Routine general medical examination at a health care facility - Primary    1) Anticipatory Guidance: Discussed importance of  wearing a seatbelt while driving and not texting while driving; changing batteries in smoke detector at least once annually; wearing suntan lotion when outside; eating a balanced and moderate diet; getting physical activity at least 30 minutes per day.  2) Immunizations / Screenings / Labs:  Influenza updated today. All other immunizations are up-to-date per recommendations. Due for dental exam encouraged to be completed independently. Obtain hepatitis C antibody for hepatitis C screening. Due for colon cancer screening with referral to gastroenterology placed for colonoscopy. Obtain hemoglobin A1c and urine microalbumin for diabetes screening. Diabetic foot exam completed today. Patient requests screening for low testosterone. Due for prostate cancer screening with PSA ordered. All other screenings are up-to-date per recommendations. Obtain CBC, CMET, and lipid profile.    Overall well exam with risk factors for cardiovascular disease including hyperlipidemia, obesity, and diabetes.  Chronic conditions appear adequately controlled through medication regimens and lifestyle management. Changes pending blood work results. Continue healthy lifestyle behaviors and choices. Recommended weight loss of 5-10% of current body weight through continue nutrition and physical activity changes. Follow-up prevention exam in 1 year. Follow-up office visit for chronic conditions pending blood work.       Relevant Orders   Ambulatory referral to Gastroenterology   Testosterone   CBC   Comprehensive metabolic panel   Lipid panel   PSA   TSH   Hemoglobin A1c   Urine Microalbumin w/creat. ratio   Hepatitis C antibody   B12 and Folate Panel    Other Visit Diagnoses    At risk for colon cancer       Relevant Medications   colchicine 0.6 MG tablet   Other Relevant Orders   Ambulatory referral to Gastroenterology   Encounter for immunization       Relevant Orders   Flu Vaccine QUAD 36+ mos IM (Completed)       I have discontinued Mr. Biehl HYDROcodone-acetaminophen, oxyCODONE-acetaminophen, and naproxen. I am also having him maintain his tamsulosin and colchicine.   Meds ordered this encounter  Medications  . DISCONTD: colchicine 0.6 MG tablet    Sig: Take 2 tabs by mouth then 1 tab 6 hours later for acute attack. Start 1 tab daily 12 hours after resolution of flare.    Dispense:  30 tablet    Refill:  0    Order Specific Question:   Supervising Provider    Answer:   Pricilla Holm A [id number]  . tamsulosin (FLOMAX) 0.4 MG CAPS capsule    Sig: Take 1 capsule (0.4 mg total) by mouth daily.    Dispense:  30 capsule    Refill:  3    Order Specific Question:   Supervising Provider    Answer:   Pricilla Holm A [id number]  . colchicine 0.6 MG tablet    Sig: Take 2 tabs by mouth then 1 tab 6 hours later for acute attack. Start 1 tab daily 12 hours after resolution of flare.    Dispense:  30 tablet    Refill:  0    Order Specific Question:   Supervising Provider     Answer:   Pricilla Holm A [id number]     Follow-up: Return in about 3 months (around 01/31/2016), or if symptoms worsen or fail to improve.   Mauricio Po, FNP

## 2015-11-04 ENCOUNTER — Other Ambulatory Visit (INDEPENDENT_AMBULATORY_CARE_PROVIDER_SITE_OTHER): Payer: BLUE CROSS/BLUE SHIELD

## 2015-11-04 DIAGNOSIS — E119 Type 2 diabetes mellitus without complications: Secondary | ICD-10-CM | POA: Diagnosis not present

## 2015-11-04 DIAGNOSIS — Z0000 Encounter for general adult medical examination without abnormal findings: Secondary | ICD-10-CM

## 2015-11-04 LAB — B12 AND FOLATE PANEL
FOLATE: 7.9 ng/mL (ref >5.9)
Vitamin B-12: 414 pg/mL (ref 211–911)

## 2015-11-04 LAB — LIPID PANEL
CHOLESTEROL: 156 mg/dL (ref 0–200)
HDL: 23.6 mg/dL — AB (ref >39.00)
NonHDL: 132.19
Total CHOL/HDL Ratio: 7
Triglycerides: 212 mg/dL — ABNORMAL HIGH (ref 0.0–149.0)
VLDL: 42.4 mg/dL — ABNORMAL HIGH (ref 0.0–40.0)

## 2015-11-04 LAB — COMPREHENSIVE METABOLIC PANEL
ALBUMIN: 4.1 g/dL (ref 3.5–5.2)
ALK PHOS: 51 U/L (ref 39–117)
ALT: 27 U/L (ref 0–53)
AST: 20 U/L (ref 0–37)
BILIRUBIN TOTAL: 0.3 mg/dL (ref 0.2–1.2)
BUN: 11 mg/dL (ref 6–23)
CALCIUM: 9.4 mg/dL (ref 8.4–10.5)
CO2: 27 mEq/L (ref 19–32)
Chloride: 105 mEq/L (ref 96–112)
Creatinine, Ser: 0.89 mg/dL (ref 0.40–1.50)
GFR: 112.74 mL/min (ref >60.00)
GLUCOSE: 110 mg/dL — AB (ref 70–99)
Potassium: 4.5 mEq/L (ref 3.5–5.1)
Sodium: 141 mEq/L (ref 135–145)
TOTAL PROTEIN: 6.8 g/dL (ref 6.0–8.3)

## 2015-11-04 LAB — CBC
HCT: 41.3 % (ref 39.0–52.0)
HEMOGLOBIN: 14.1 g/dL (ref 13.0–17.0)
MCHC: 34 g/dL (ref 30.0–36.0)
MCV: 88 fl (ref 78.0–100.0)
PLATELETS: 314 10*3/uL (ref 150.0–400.0)
RBC: 4.7 Mil/uL (ref 4.22–5.81)
RDW: 13 % (ref 11.5–15.5)
WBC: 7.8 10*3/uL (ref 4.0–10.5)

## 2015-11-04 LAB — TSH: TSH: 3.44 u[IU]/mL (ref 0.35–4.50)

## 2015-11-04 LAB — LDL CHOLESTEROL, DIRECT: Direct LDL: 103 mg/dL

## 2015-11-04 LAB — MICROALBUMIN / CREATININE URINE RATIO
CREATININE, U: 172.5 mg/dL
MICROALB UR: 0.8 mg/dL (ref 0.0–1.9)
Microalb Creat Ratio: 0.5 mg/g (ref 0.0–30.0)

## 2015-11-04 LAB — TESTOSTERONE: TESTOSTERONE: 170.25 ng/dL — AB (ref 300.00–890.00)

## 2015-11-04 LAB — HEMOGLOBIN A1C: Hgb A1c MFr Bld: 6.3 % (ref 4.6–6.5)

## 2015-11-04 LAB — PSA: PSA: 1.29 ng/mL (ref 0.10–4.00)

## 2015-11-05 LAB — HEPATITIS C ANTIBODY: HCV AB: NEGATIVE

## 2015-11-06 ENCOUNTER — Other Ambulatory Visit: Payer: Self-pay | Admitting: Family

## 2015-11-06 MED ORDER — PRAVASTATIN SODIUM 20 MG PO TABS: 20.0000 mg | ORAL_TABLET | Freq: Every day | ORAL | 1 refills | Status: DC

## 2015-11-08 ENCOUNTER — Encounter: Payer: Self-pay | Admitting: Gastroenterology

## 2015-12-10 ENCOUNTER — Ambulatory Visit (AMBULATORY_SURGERY_CENTER): Payer: Self-pay | Admitting: *Deleted

## 2015-12-10 VITALS — Ht 68.5 in | Wt 269.0 lb

## 2015-12-10 DIAGNOSIS — Z1211 Encounter for screening for malignant neoplasm of colon: Secondary | ICD-10-CM

## 2015-12-10 MED ORDER — NA SULFATE-K SULFATE-MG SULF 17.5-3.13-1.6 GM/177ML PO SOLN: ORAL | 0 refills | Status: DC

## 2015-12-10 NOTE — Progress Notes (Signed)
Patient denies any allergies to eggs or soy. Patient denies any problems with anesthesia/sedation. Patient denies any oxygen use at home and does not take any diet/weight loss medications. Patient declined EMMI education. 

## 2015-12-19 ENCOUNTER — Telehealth: Payer: Self-pay | Admitting: Gastroenterology

## 2015-12-19 DIAGNOSIS — Z1211 Encounter for screening for malignant neoplasm of colon: Secondary | ICD-10-CM

## 2015-12-19 MED ORDER — NA SULFATE-K SULFATE-MG SULF 17.5-3.13-1.6 GM/177ML PO SOLN: 1.0000 | Freq: Once | ORAL | 0 refills | Status: AC

## 2015-12-19 NOTE — Telephone Encounter (Signed)
Sent suprep to [facility]- pt aware marie PV

## 2015-12-20 ENCOUNTER — Encounter: Payer: Self-pay | Admitting: Gastroenterology

## 2015-12-20 ENCOUNTER — Ambulatory Visit (AMBULATORY_SURGERY_CENTER): Payer: BLUE CROSS/BLUE SHIELD | Admitting: Gastroenterology

## 2015-12-20 VITALS — BP 116/72 | HR 63 | Temp 98.2°F | Resp 18 | Ht 68.0 in | Wt 270.0 lb

## 2015-12-20 DIAGNOSIS — D123 Benign neoplasm of transverse colon: Secondary | ICD-10-CM

## 2015-12-20 DIAGNOSIS — Z1212 Encounter for screening for malignant neoplasm of rectum: Secondary | ICD-10-CM

## 2015-12-20 DIAGNOSIS — D122 Benign neoplasm of ascending colon: Secondary | ICD-10-CM

## 2015-12-20 DIAGNOSIS — D120 Benign neoplasm of cecum: Secondary | ICD-10-CM | POA: Diagnosis not present

## 2015-12-20 DIAGNOSIS — D124 Benign neoplasm of descending colon: Secondary | ICD-10-CM

## 2015-12-20 DIAGNOSIS — Z1211 Encounter for screening for malignant neoplasm of colon: Secondary | ICD-10-CM

## 2015-12-20 MED ORDER — SODIUM CHLORIDE 0.9 % IV SOLN: 500.0000 mL | INTRAVENOUS | Status: DC

## 2015-12-20 NOTE — Op Note (Signed)
David Wall Patient Name: David Wall Procedure Date: 12/20/2015 2:05 PM MRN: [id number] Endoscopist: Mauri Pole , MD Age: 60 Referring MD:  Date of Birth: 06-21-1955 Gender: Male Account #: [id number] Procedure:                Colonoscopy Indications:              Screening for colorectal malignant neoplasm, This                            is the patient's first colonoscopy Medicines:                Monitored Anesthesia Care Procedure:                Pre-Anesthesia Assessment:                           - Prior to the procedure, a History and Physical                            was performed, and patient medications and                            allergies were reviewed. The patient's tolerance of                            previous anesthesia was also reviewed. The risks                            and benefits of the procedure and the sedation                            options and risks were discussed with the patient.                            All questions were answered, and informed consent                            was obtained. Prior Anticoagulants: The patient has                            taken no previous anticoagulant or antiplatelet                            agents. ASA Grade Assessment: II - A patient with                            mild systemic disease. After reviewing the risks                            and benefits, the patient was deemed in                            satisfactory condition to undergo the procedure.  After obtaining informed consent, the colonoscope                            was passed under direct vision. Throughout the                            procedure, the patient's blood pressure, pulse, and                            oxygen saturations were monitored continuously. The                            Model CF-HQ190L [id number]) scope was introduced                            through the anus and  advanced to the the cecum,                            identified by appendiceal orifice and ileocecal                            valve. The colonoscopy was performed without                            difficulty. The patient tolerated the procedure                            well. The quality of the bowel preparation was                            good. The terminal ileum, ileocecal valve,                            appendiceal orifice, and rectum were photographed. Scope In: 2:18:08 PM Scope Out: 2:32:08 PM Scope Withdrawal Time: 0 hours 11 minutes 43 seconds  Total Procedure Duration: 0 hours 14 minutes 0 seconds  Findings:                 The perianal and digital rectal examinations were                            normal.                           Six sessile polyps were found in the descending                            colon, transverse colon and ascending colon. The                            polyps were 5 to 9 mm in size. These polyps were                            removed with a cold snare. Resection and retrieval  were complete.                           A 1 mm polyp was found in the ileocecal valve. The                            polyp was sessile. The polyp was removed with a                            cold biopsy forceps. Resection and retrieval were                            complete.                           A few small-mouthed diverticula were found in the                            sigmoid colon.                           Non-bleeding internal hemorrhoids were found during                            retroflexion. The hemorrhoids were moderate. Complications:            No immediate complications. Estimated Blood Loss:     Estimated blood loss was minimal. Impression:               - Six 5 to 9 mm polyps in the descending colon, in                            the transverse colon and in the ascending colon,                            removed  with a cold snare. Resected and retrieved.                           - One 1 mm polyp at the ileocecal valve, removed                            with a cold biopsy forceps. Resected and retrieved.                           - Diverticulosis in the sigmoid colon.                           - Non-bleeding internal hemorrhoids. Recommendation:           - Patient has a contact number available for                            emergencies. The signs and symptoms of potential  delayed complications were discussed with the                            patient. Return to normal activities tomorrow.                            Written discharge instructions were provided to the                            patient.                           - Resume previous diet.                           - Continue present medications.                           - Await pathology results.                           - Repeat colonoscopy in 3 years for surveillance                            based on pathology results. Mauri Pole, MD 12/20/2015 2:42:56 PM This report has been signed electronically.

## 2015-12-20 NOTE — Patient Instructions (Signed)
YOU HAD AN ENDOSCOPIC PROCEDURE TODAY AT [facility]:   Refer to the procedure report that was given to you for any specific questions about what was found during the examination.  If the procedure report does not answer your questions, please call your gastroenterologist to clarify.  If you requested that your care partner not be given the details of your procedure findings, then the procedure report has been included in a sealed envelope for you to review at your convenience later.  YOU SHOULD EXPECT: Some feelings of bloating in the abdomen. Passage of more gas than usual.  Walking can help get rid of the air that was put into your GI tract during the procedure and reduce the bloating. If you had a lower endoscopy (such as a colonoscopy or flexible sigmoidoscopy) you may notice spotting of blood in your stool or on the toilet paper. If you underwent a bowel prep for your procedure, you may not have a normal bowel movement for a few days.  Please Note:  You might notice some irritation and congestion in your nose or some drainage.  This is from the oxygen used during your procedure.  There is no need for concern and it should clear up in a day or so.  SYMPTOMS TO REPORT IMMEDIATELY:   Following lower endoscopy (colonoscopy or flexible sigmoidoscopy):  Excessive amounts of blood in the stool  Significant tenderness or worsening of abdominal pains  Swelling of the abdomen that is new, acute  Fever of 100F or higher   For urgent or emergent issues, a gastroenterologist can be reached at any hour by calling [phone].   DIET:  We do recommend a small meal at first, but then you may proceed to your regular diet.  Drink plenty of fluids but you should avoid alcoholic beverages for 24 hours.  ACTIVITY:  You should plan to take it easy for the rest of today and you should NOT DRIVE or use heavy machinery until tomorrow (because of the sedation medicines used during the test).     FOLLOW UP: Our staff will call the number listed on your records the next business day following your procedure to check on you and address any questions or concerns that you may have regarding the information given to you following your procedure. If we do not reach you, we will leave a message.  However, if you are feeling well and you are not experiencing any problems, there is no need to return our call.  We will assume that you have returned to your regular daily activities without incident.  If any biopsies were taken you will be contacted by phone or by letter within the next 1-3 weeks.  Please call us at [phone] if you have not heard about the biopsies in 3 weeks.    SIGNATURES/CONFIDENTIALITY: You and/or your care partner have signed paperwork which will be entered into your electronic medical record.  These signatures attest to the fact that that the information above on your After Visit Summary has been reviewed and is understood.  Full responsibility of the confidentiality of this discharge information lies with you and/or your care-partner.  Polyps, diverticulosis, high fiber diet, hemorrhoids-handouts given  Repeat colonoscopy will be determined by pathology.

## 2015-12-20 NOTE — Progress Notes (Signed)
To recovery, repport to [employer], RN, VSS

## 2015-12-20 NOTE — Progress Notes (Signed)
Called to room to assist during endoscopic procedure.  Patient ID and intended procedure confirmed with present staff. Received instructions for my participation in the procedure from the performing physician.  

## 2015-12-23 ENCOUNTER — Telehealth: Payer: Self-pay

## 2015-12-23 NOTE — Telephone Encounter (Signed)
  Follow up Call-  Call back number 12/20/2015  Post procedure Call Back phone  # [phone]  Permission to leave phone message Yes  Some recent data might be hidden     Patient questions:  Do you have a fever, pain , or abdominal swelling? No. Pain Score  0 *  Have you tolerated food without any problems? Yes.    Have you been able to return to your normal activities? Yes.    Do you have any questions about your discharge instructions: Diet   No. Medications  No. Follow up visit  No.   Do you have questions or concerns about your Care? No.  Actions: * If pain score is 4 or above: No action needed, pain <4.

## 2016-01-05 ENCOUNTER — Encounter: Payer: Self-pay | Admitting: Gastroenterology

## 2016-03-09 ENCOUNTER — Ambulatory Visit (HOSPITAL_COMMUNITY)
Admission: EM | Admit: 2016-03-09 | Discharge: 2016-03-09 | Disposition: A | Discharge status: Home / Self Care | Payer: BLUE CROSS/BLUE SHIELD | Attending: Internal Medicine | Admitting: Internal Medicine

## 2016-03-09 ENCOUNTER — Encounter (HOSPITAL_COMMUNITY): Payer: Self-pay | Admitting: Emergency Medicine

## 2016-03-09 DIAGNOSIS — S46212A Strain of muscle, fascia and tendon of other parts of biceps, left arm, initial encounter: Secondary | ICD-10-CM | POA: Diagnosis not present

## 2016-03-09 DIAGNOSIS — S161XXA Strain of muscle, fascia and tendon at neck level, initial encounter: Secondary | ICD-10-CM | POA: Diagnosis not present

## 2016-03-09 DIAGNOSIS — M25512 Pain in left shoulder: Secondary | ICD-10-CM

## 2016-03-09 MED ORDER — KETOROLAC TROMETHAMINE 60 MG/2ML IM SOLN: 60.0000 mg | Freq: Once | INTRAMUSCULAR | Status: DC

## 2016-03-09 MED ORDER — METHYLPREDNISOLONE 4 MG PO TBPK: ORAL_TABLET | ORAL | 0 refills | Status: DC

## 2016-03-09 MED ORDER — METHOCARBAMOL 500 MG PO TABS: 500.0000 mg | ORAL_TABLET | Freq: Two times a day (BID) | ORAL | 0 refills | Status: DC
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
## 2016-07-25 IMAGING — US US SCROTUM
1 series · 14 of 25 positions shown · non-contrast
Comparison: None.

CLINICAL DATA: 58-year-old male with left scrotal pain for 2 days.
Initial encounter.

EXAM:
SCROTAL ULTRASOUND
DOPPLER ULTRASOUND OF THE TESTICLES
TECHNIQUE: Complete ultrasound examination of the testicles, epididymis, and
other scrotal structures was performed. Color and spectral Doppler
ultrasound were also utilized to evaluate blood flow to the
testicles.

[Series 1: us scrotum · 0.07mm/px · 32 acquisitions, 14 frames shown]
[im 1/32]
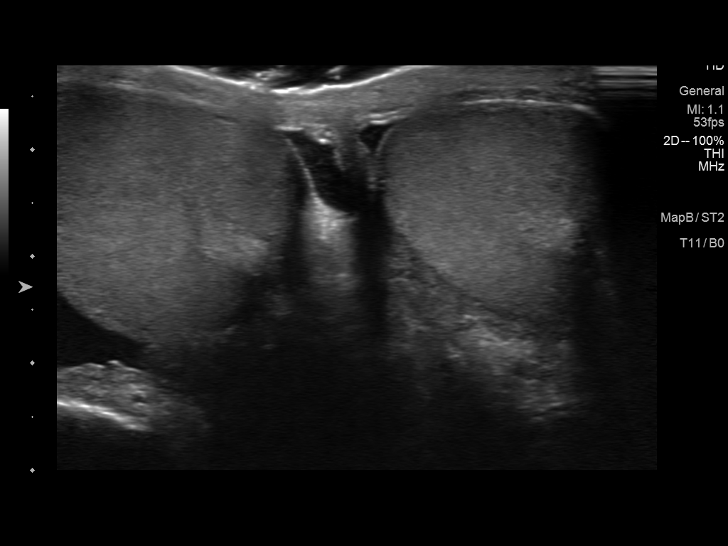
[im 3/32]
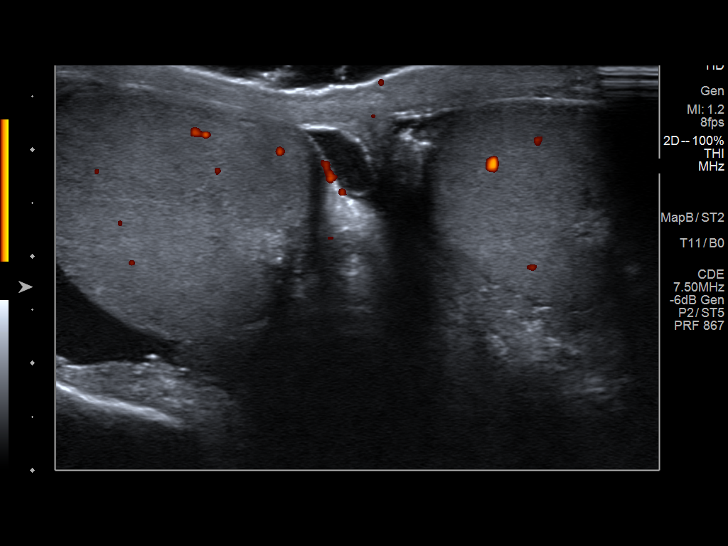
[im 6/32]
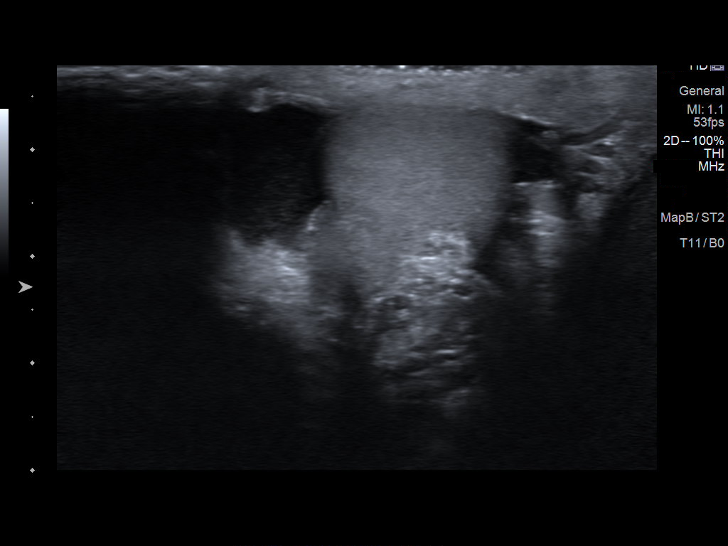
[im 8/32]
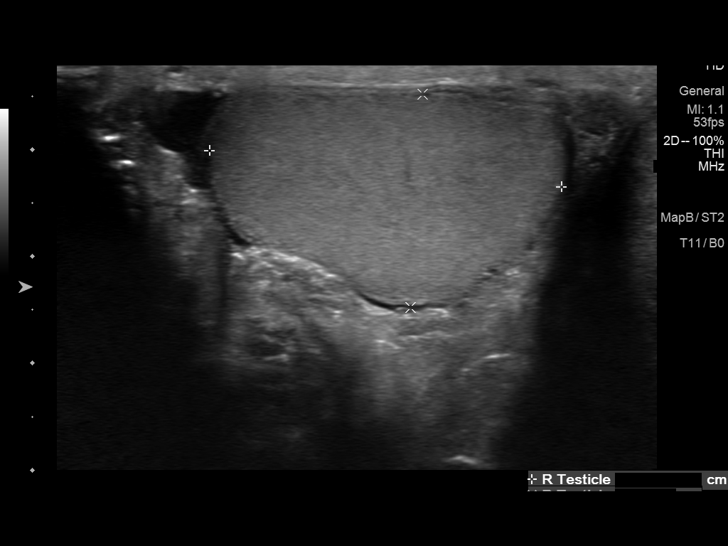
[im 11/32]
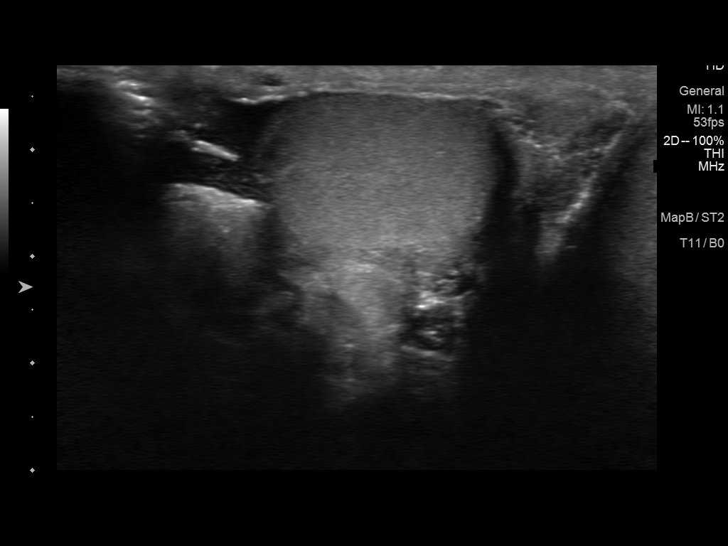
[im 12/32]
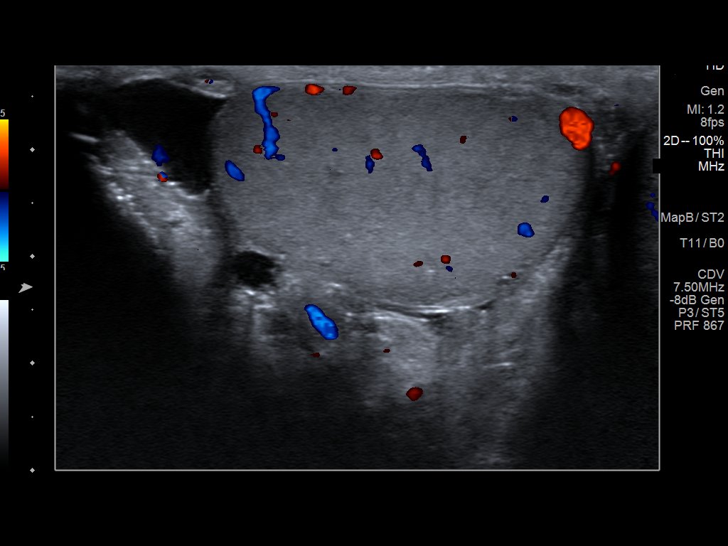
[im 15/32]
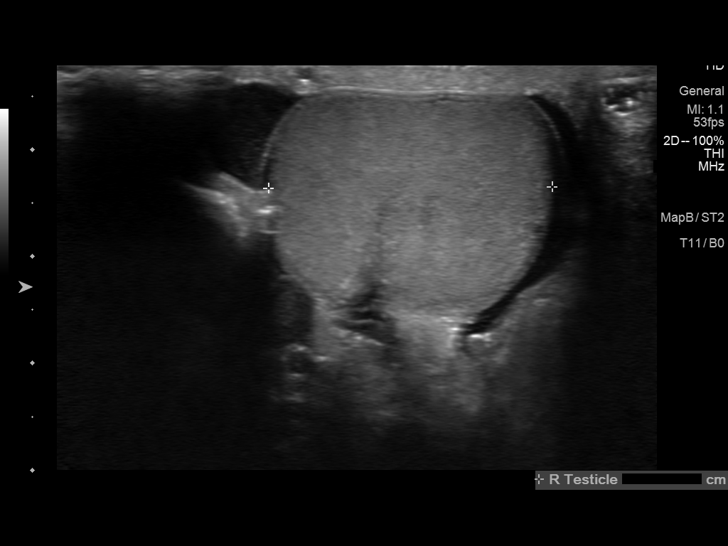
[im 17/32]
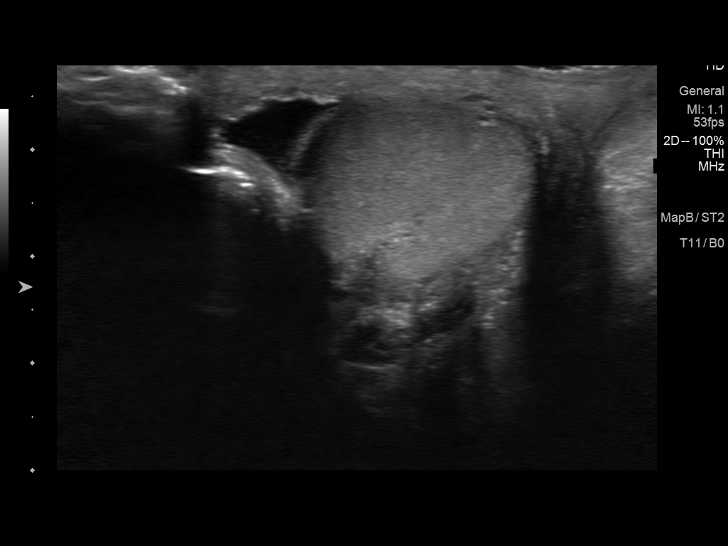
[im 20/32]
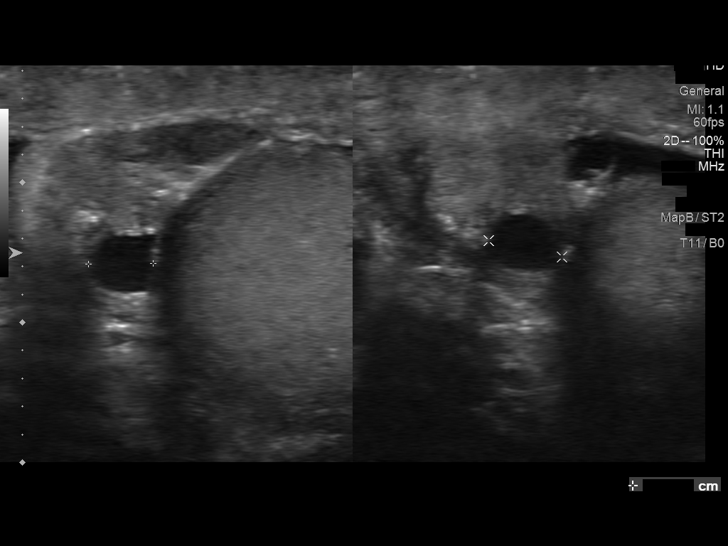
[im 21/32]
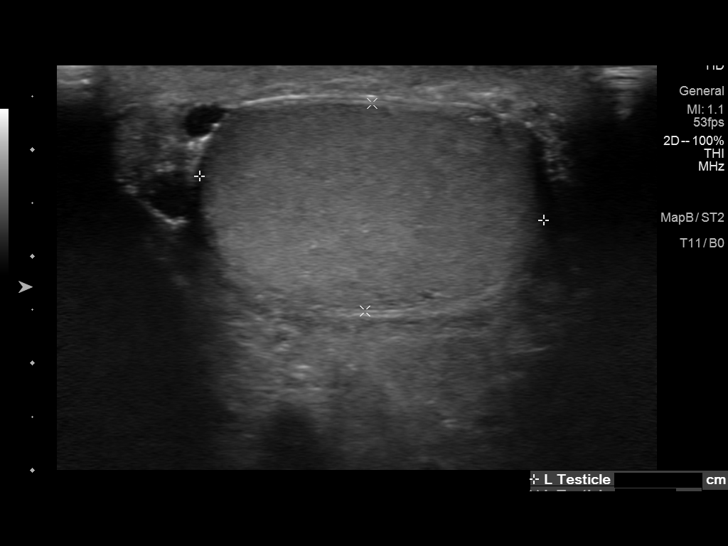
[im 24/32]
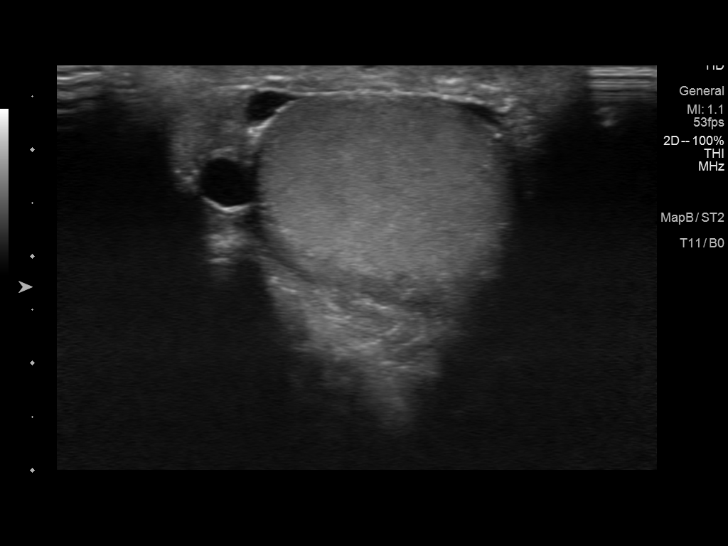
[im 26/32]
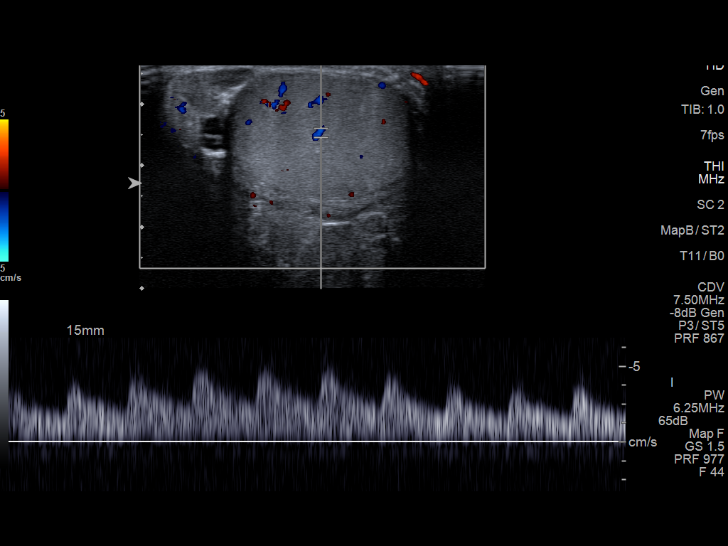
[im 29/32]
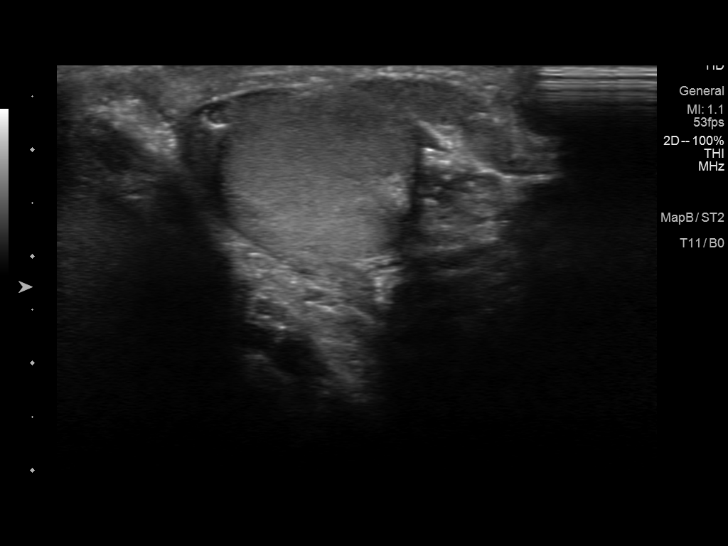
[im 32/32]
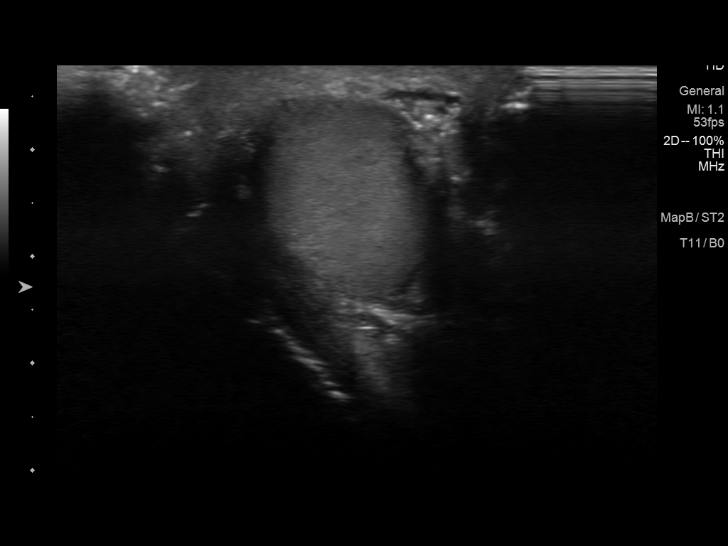

[14 of 25 positions shown; findings below may reference images not displayed]

FINDINGS: Right testicle

Measurements: 3.3 x 2 x 2.7 cm. No mass or microlithiasis
visualized.

Left testicle

Measurements: 3.3 x 1.9 x 2.1 cm. No mass or microlithiasis
visualized.

Right epididymis:  Small spermatoceles/epididymal cysts are noted.

Left epididymis:  Small spermatoceles/epididymal cysts are noted.

Hydrocele:  A small to moderate right hydrocele is noted.

Varicocele:  None visualized.

Pulsed Doppler interrogation of both testes demonstrates low
resistance arterial and venous waveforms bilaterally.
IMPRESSION: Normal testicles bilaterally.  No evidence of testicular torsion.

Small to moderate right hydrocele.

## 2017-01-19 IMAGING — DX DG KNEE 1-2V PORT*R*
2 series · 2 of 2 positions shown · non-contrast
Comparison: April 28, 2014

CLINICAL DATA: Status post total knee replacement

EXAM:
PORTABLE RIGHT KNEE - 1-2 VIEW

[knee ap]
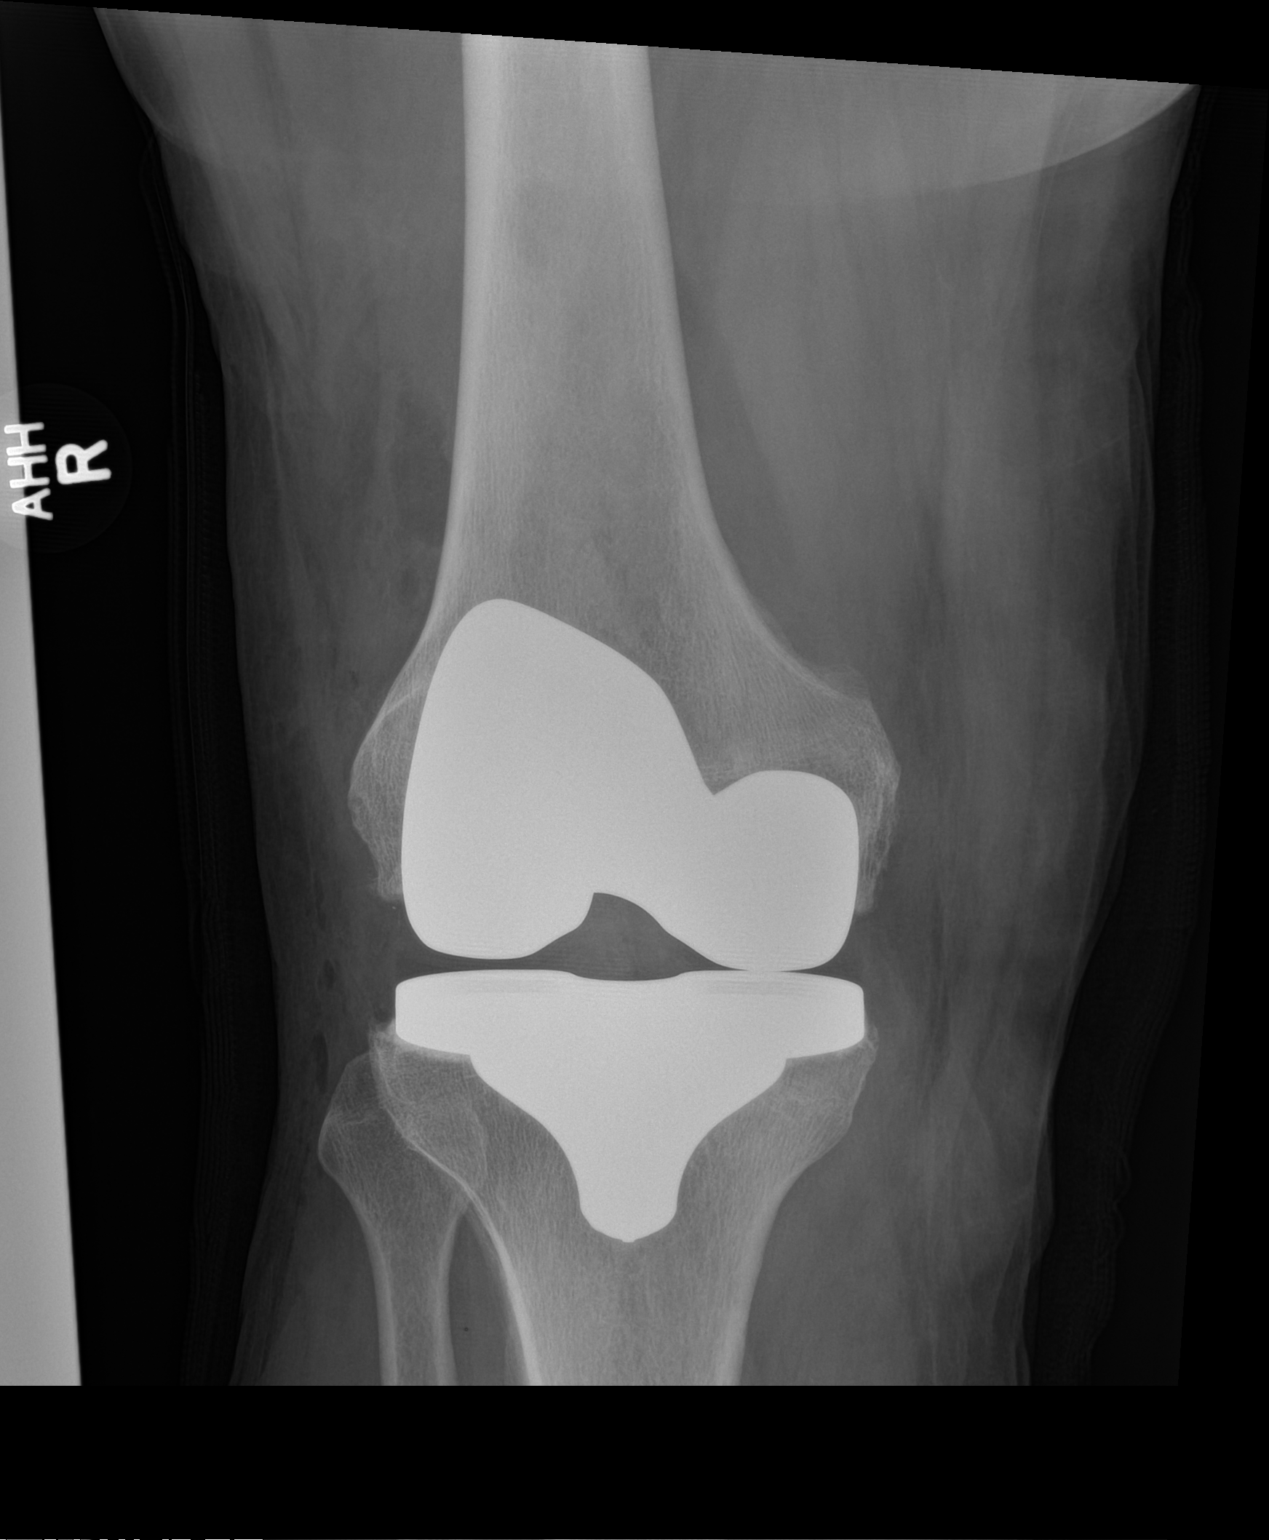

[knee lat]
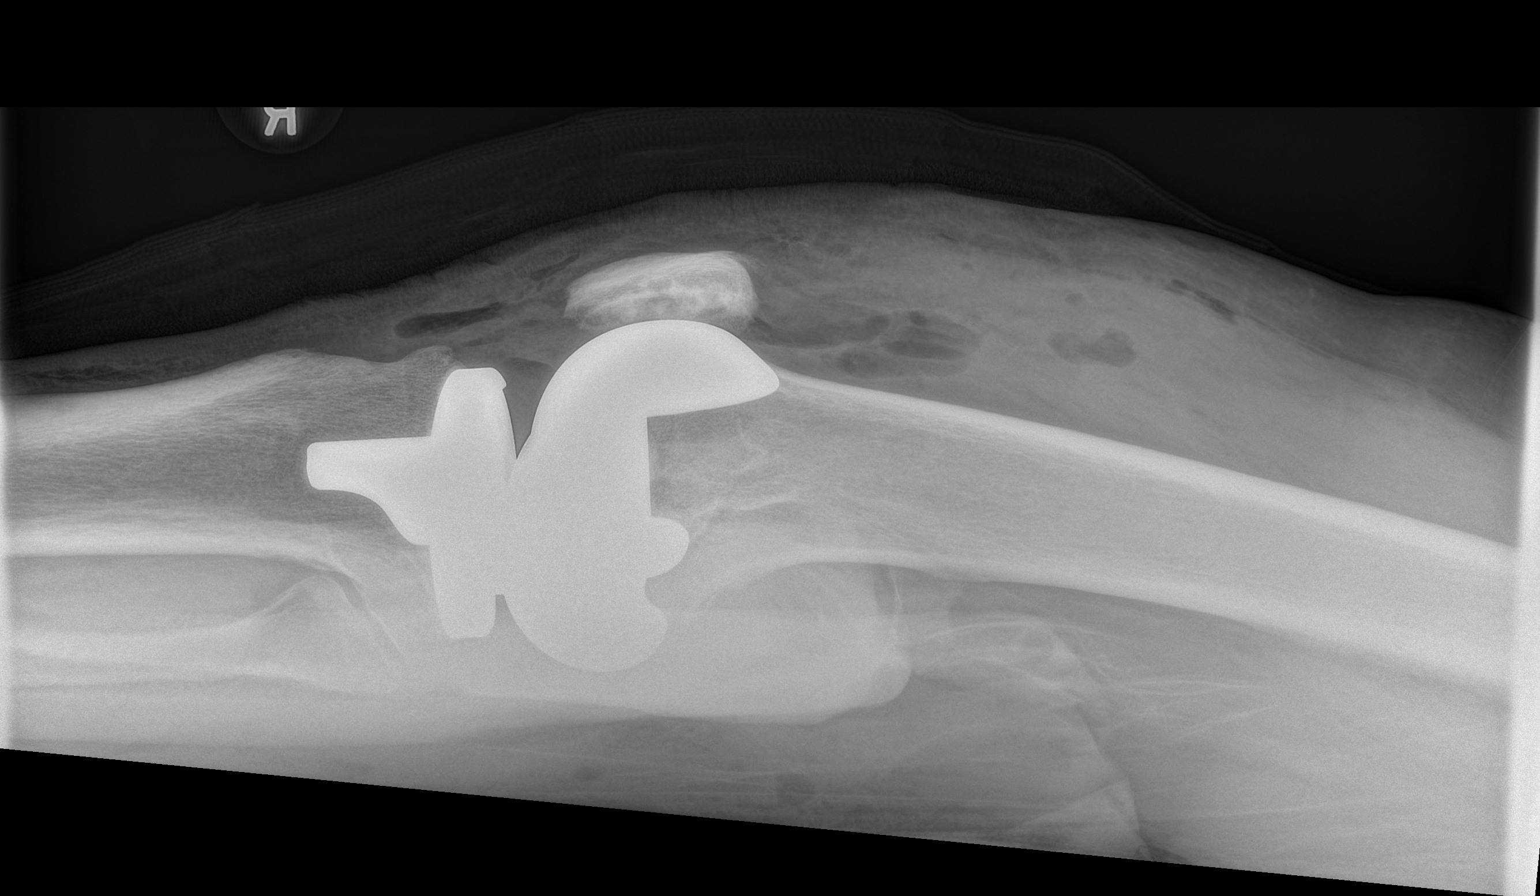

[2 of 2 positions shown; findings below may reference images not displayed]

FINDINGS: Frontal and lateral views were obtained. The patient is status post
total knee replacement. Femoral and tibial prosthetic components
appear well seated. No fracture or dislocation. Air within the joint
is an expected postoperative finding.
IMPRESSION: New prosthetic components appear well seated. No fracture or
dislocation.

## 2017-01-20 ENCOUNTER — Other Ambulatory Visit: Payer: Self-pay

## 2017-01-20 ENCOUNTER — Encounter (HOSPITAL_BASED_OUTPATIENT_CLINIC_OR_DEPARTMENT_OTHER): Payer: Self-pay | Admitting: *Deleted

## 2017-01-20 ENCOUNTER — Emergency Department (HOSPITAL_BASED_OUTPATIENT_CLINIC_OR_DEPARTMENT_OTHER)
Admission: EM | Admit: 2017-01-20 | Discharge: 2017-01-20 | Disposition: A | Discharge status: Home / Self Care | Payer: BLUE CROSS/BLUE SHIELD | Attending: Emergency Medicine | Admitting: Emergency Medicine

## 2017-01-20 DIAGNOSIS — Z96651 Presence of right artificial knee joint: Secondary | ICD-10-CM | POA: Insufficient documentation

## 2017-01-20 DIAGNOSIS — J45909 Unspecified asthma, uncomplicated: Secondary | ICD-10-CM | POA: Insufficient documentation

## 2017-01-20 DIAGNOSIS — M10071 Idiopathic gout, right ankle and foot: Secondary | ICD-10-CM | POA: Insufficient documentation

## 2017-01-20 DIAGNOSIS — E119 Type 2 diabetes mellitus without complications: Secondary | ICD-10-CM | POA: Insufficient documentation

## 2017-01-20 DIAGNOSIS — Z79899 Other long term (current) drug therapy: Secondary | ICD-10-CM | POA: Insufficient documentation

## 2017-01-20 DIAGNOSIS — M109 Gout, unspecified: Secondary | ICD-10-CM

## 2017-01-20 MED ORDER — COLCHICINE 0.6 MG PO TABS: 0.6000 mg | ORAL_TABLET | Freq: Every day | ORAL | 0 refills | Status: DC

## 2017-01-20 MED ORDER — OXYCODONE-ACETAMINOPHEN 5-325 MG PO TABS: 1.0000 | ORAL_TABLET | ORAL | 0 refills | Status: DC | PRN

## 2017-01-20 MED ORDER — COLCHICINE 0.6 MG PO TABS
0.6000 mg | ORAL_TABLET | Freq: Once | ORAL | Status: AC
  Administered 2017-01-20: 0.6 mg via ORAL
  Filled 2017-01-20: qty 1

## 2017-01-20 MED ORDER — OXYCODONE-ACETAMINOPHEN 5-325 MG PO TABS
1.0000 | ORAL_TABLET | Freq: Once | ORAL | Status: AC
  Administered 2017-01-20: 1 via ORAL
  Filled 2017-01-20: qty 1

## 2017-01-20 MED ORDER — PREDNISONE 20 MG PO TABS: 40.0000 mg | ORAL_TABLET | Freq: Every day | ORAL | 0 refills | Status: DC

## 2017-01-20 MED ORDER — PREDNISONE 20 MG PO TABS
40.0000 mg | ORAL_TABLET | Freq: Once | ORAL | Status: AC
  Administered 2017-01-20: 40 mg via ORAL
  Filled 2017-01-20: qty 2

## 2017-01-20 MED FILL — predniSONE 20 MG TABS: 20 | 5 days supply | Qty: 10 | Fill #0

## 2017-01-20 MED FILL — OXYCODONE-ACETAMINOPHEN 5-3: 5-325 | 2 days supply | Qty: 8 | Fill #0

## 2017-01-20 MED FILL — COLCHICINE 0.6 MG TABS: 0.6 | 5 days supply | Qty: 5 | Fill #0

## 2017-01-20 NOTE — ED Triage Notes (Signed)
Pt reports his usual gout pain and swelling to his right ankle and heel x one week.

## 2017-01-20 NOTE — ED Provider Notes (Signed)
[facility] EMERGENCY DEPARTMENT Provider Note   CSN: [id number] Arrival date & time: 01/20/17  1046     History   Chief Complaint Chief Complaint  Patient presents with  . Foot Pain    HPI David Wall is a 62 y.o. male.  HPI   61 year old male with right ankle/foot pain.  Gradual onset in the past day.  He denies any discrete trauma or overuse.  He does have a past history of gout and questions whether symptoms may be from this.  Pain is tolerable at rest.  Significantly worse with movement.  No fevers or chills.  Denies any acute pain or swelling in any other joints.  He has not tried taking anything for his recent symptoms.  He did just recently indulge in more alcohol than he typically would because of new years. Normally is just occasional drinker.   Past Medical History:  Diagnosis Date  . Arthritis   . Asthma    hx of in childhood  . Diabetes mellitus without complication ([place])    no tx  . GERD (gastroesophageal reflux disease)   . Gout   . H/O urinary frequency    as noted in H&P per Dr Loanne Drilling 04/25/2014     Patient Active Problem List   Diagnosis Date Noted  . Osteoarthritis of right knee 06/15/2014  . Status post total right knee replacement 06/15/2014  . Sleep disorder 04/25/2014  . Inguinal hernia 12/21/2013  . Diabetes ([place]) 10/11/2013  . Routine general medical examination at a health care facility 10/10/2013  . Screening for prostate cancer 10/10/2013  . GOUT 04/26/2008  . RECTAL BLEEDING 04/26/2008  . CHEST PAIN UNSPECIFIED 04/25/2007  . HYPERCHOLESTEROLEMIA 01/26/2007  . UNSPECIFIED CHRONIC ISCHEMIC HEART DISEASE 01/26/2007  . CERVICAL RADICULOPATHY, RIGHT 01/26/2007  . INSOMNIA 01/26/2007    Past Surgical History:  Procedure Laterality Date  . EYE SURGERY     laser surgery bilat   . JOINT REPLACEMENT    . TOTAL KNEE ARTHROPLASTY Right 06/15/2014   Procedure: RIGHT TOTAL KNEE ARTHROPLASTY;  Surgeon: Mcarthur Rossetti, MD;   Location: [facility];  Service: Orthopedics;  Laterality: Right;  . UPPER GI ENDOSCOPY         Home Medications    Prior to Admission medications   Medication Sig Start Date End Date Taking? Authorizing Provider  methocarbamol (ROBAXIN) 500 MG tablet Take 1 tablet (500 mg total) by mouth 2 (two) times daily. 03/09/16   Lysbeth Penner, FNP  methylPREDNISolone (MEDROL DOSEPAK) 4 MG TBPK tablet Take 6-5-4-3-2-1 po qd 03/09/16   Lysbeth Penner, FNP  pravastatin (PRAVACHOL) 20 MG tablet Take 1 tablet (20 mg total) by mouth daily. 11/06/15   Golden Circle, FNP  tamsulosin (FLOMAX) 0.4 MG CAPS capsule Take 1 capsule (0.4 mg total) by mouth daily. 10/31/15   Golden Circle, FNP    Family History Family History  Problem Relation Age of Onset  . Healthy Mother   . Healthy Father   . Colon cancer Neg Hx     Social History Social History   Tobacco Use  . Smoking status: Never Smoker  . Smokeless tobacco: Never Used  Substance Use Topics  . Alcohol use: Yes    Comment: occasional; 1-2 drinks per week per pt  . Drug use: No     Allergies   Amoxicillin; Aspirin; Penicillins; and Allopurinol   Review of Systems Review of Systems  All systems reviewed and negative, other than as  noted in HPI.  Physical Exam Updated Vital Signs Ht 5\' 8"  (1.727 m)   Wt 127.9 kg (282 lb)   BMI 42.88 kg/m   Physical Exam  Constitutional: He appears well-developed and well-nourished. No distress.  Laying in bed.  No acute distress.  Obese.  HENT:  Head: Normocephalic and atraumatic.  Eyes: Conjunctivae are normal. Right eye exhibits no discharge. Left eye exhibits no discharge.  Neck: Neck supple.  Cardiovascular: Normal rate, regular rhythm and normal heart sounds. Exam reveals no gallop and no friction rub.  No murmur heard. Pulmonary/Chest: Effort normal and breath sounds normal. No respiratory distress.  Abdominal: Soft. He exhibits no distension. There is no tenderness.    Musculoskeletal: He exhibits no edema or tenderness.  Right ankle has mild swelling, increased warmth, is erythematous and tender.  He has significant pain with passive range of motion of the ankle.  Neurovascular intact distally.  There is no significant swelling or other skin changes distally into the foot.  Neurological: He is alert.  Skin: Skin is warm and dry.  Psychiatric: He has a normal mood and affect. His behavior is normal. Thought content normal.  Nursing note and vitals reviewed.    ED Treatments / Results  Labs (all labs ordered are listed, but only abnormal results are displayed) Labs Reviewed - No data to display  EKG  EKG Interpretation None       Radiology No results found.  Procedures Procedures (including critical care time)  Medications Ordered in ED Medications  colchicine tablet 0.6 mg (not administered)  predniSONE (DELTASONE) tablet 40 mg (not administered)  oxyCODONE-acetaminophen (PERCOCET/ROXICET) 5-325 MG per tablet 1 tablet (not administered)     Initial Impression / Assessment and Plan / ED Course  I have reviewed the triage vital signs and the nursing notes.  Pertinent labs & imaging results that were available during my care of the patient were reviewed by me and considered in my medical decision making (see chart for details).     62 year old male with atraumatic right ankle pain.  Swelling, increased warmth, erythema, tenderness and significant pain with range of motion.  I suspect this is gout.  History is suggestive.  He has a past history of it as well.  Significant alcohol use during [place] celebration may have precipitated.  Plan presumptive treatment for gout at this point.  Return precautions discussed.  Final Clinical Impressions(s) / ED Diagnoses   Final diagnoses:  Acute gout of right ankle, unspecified cause    ED Discharge Orders    None       Virgel Manifold, MD 01/20/17 1219

## 2017-03-14 ENCOUNTER — Other Ambulatory Visit: Payer: Self-pay

## 2017-03-14 ENCOUNTER — Emergency Department (HOSPITAL_BASED_OUTPATIENT_CLINIC_OR_DEPARTMENT_OTHER): Payer: Self-pay

## 2017-03-14 ENCOUNTER — Emergency Department (HOSPITAL_BASED_OUTPATIENT_CLINIC_OR_DEPARTMENT_OTHER)
Admission: EM | Admit: 2017-03-14 | Discharge: 2017-03-14 | Disposition: A | Discharge status: Home / Self Care | Payer: Self-pay | Attending: Emergency Medicine | Admitting: Emergency Medicine

## 2017-03-14 ENCOUNTER — Encounter (HOSPITAL_BASED_OUTPATIENT_CLINIC_OR_DEPARTMENT_OTHER): Payer: Self-pay | Admitting: *Deleted

## 2017-03-14 DIAGNOSIS — M109 Gout, unspecified: Secondary | ICD-10-CM | POA: Insufficient documentation

## 2017-03-14 DIAGNOSIS — J45909 Unspecified asthma, uncomplicated: Secondary | ICD-10-CM | POA: Insufficient documentation

## 2017-03-14 DIAGNOSIS — E119 Type 2 diabetes mellitus without complications: Secondary | ICD-10-CM | POA: Insufficient documentation

## 2017-03-14 MED ORDER — COLCHICINE 0.6 MG PO TABS: ORAL_TABLET | ORAL | 0 refills | Status: DC

## 2017-03-14 MED ORDER — HYDROCODONE-ACETAMINOPHEN 5-325 MG PO TABS: 1.0000 | ORAL_TABLET | ORAL | 0 refills | Status: DC | PRN

## 2017-03-14 MED ORDER — COLCHICINE 0.6 MG PO TABS
1.2000 mg | ORAL_TABLET | Freq: Once | ORAL | Status: AC
  Administered 2017-03-14: 1.2 mg via ORAL
  Filled 2017-03-14: qty 2

## 2017-03-14 NOTE — ED Notes (Signed)
Pt has hx of gout but states he has never had it in his elbow.

## 2017-03-14 NOTE — Discharge Instructions (Signed)
1.  Fill your prescriptions after you leave the emergency department.  Take 1 additional 0.6 mg colchicine tablet, ideally, within an hour of your dose in the emergency department.  After that, do not take additional colchicine until tomorrow.  Then you may take 1 tablet twice daily.  As soon as symptoms are improving but reduce to 1 tablet daily, this should be within 3 days of starting the medication.  Once symptoms are significantly improved, discontinue colchicine.  This should be within 5-7 days. 2.  You need a recheck with your family doctor within 2-3 days.  Response to colchicine must be monitored and followed for any signs of adverse side effects. 3.  Return to the emergency department if you feel you are developing signs of infection such as fever, generally feeling unwell, decreasing ability to move the elbow or pain that seems to be inside the joint.

## 2017-03-14 NOTE — ED Provider Notes (Signed)
[facility] EMERGENCY DEPARTMENT Provider Note   CSN: [id number] Arrival date & time: 03/14/17  0445     History   Chief Complaint Chief Complaint  Patient presents with  . Elbow Pain    HPI David Wall is a 62 y.o. male.  HPI Patient developed left elbow pain gradually day before yesterday.  The elbow started to get sore.  By yesterday it was becoming very painful.  He reports is very sensitive even to light touch over the back of the elbow.  Moving it hurts quite a bit.  He reports however he is able to bend and straighten but it is very painful on the backside of the elbow.  No pain in the upper arm or lower arm.  No numbness or tingling.  No associated fever, general malaise or feeling generally ill.  Patient reports he does have a history of gout but his flares of always occurred in his feet and ankles.  He reports on average he gets several flares per year.  Is never had this occur in his elbow. Past Medical History:  Diagnosis Date  . Arthritis   . Asthma    hx of in childhood  . Diabetes mellitus without complication ([place])    no tx  . GERD (gastroesophageal reflux disease)   . Gout   . H/O urinary frequency    as noted in H&P per Dr Loanne Drilling 04/25/2014     Patient Active Problem List   Diagnosis Date Noted  . Osteoarthritis of right knee 06/15/2014  . Status post total right knee replacement 06/15/2014  . Sleep disorder 04/25/2014  . Inguinal hernia 12/21/2013  . Diabetes ([place]) 10/11/2013  . Routine general medical examination at a health care facility 10/10/2013  . Screening for prostate cancer 10/10/2013  . GOUT 04/26/2008  . RECTAL BLEEDING 04/26/2008  . CHEST PAIN UNSPECIFIED 04/25/2007  . HYPERCHOLESTEROLEMIA 01/26/2007  . UNSPECIFIED CHRONIC ISCHEMIC HEART DISEASE 01/26/2007  . CERVICAL RADICULOPATHY, RIGHT 01/26/2007  . INSOMNIA 01/26/2007    Past Surgical History:  Procedure Laterality Date  . EYE SURGERY     laser surgery bilat   .  JOINT REPLACEMENT    . TOTAL KNEE ARTHROPLASTY Right 06/15/2014   Procedure: RIGHT TOTAL KNEE ARTHROPLASTY;  Surgeon: Mcarthur Rossetti, MD;  Location: [facility];  Service: Orthopedics;  Laterality: Right;  . UPPER GI ENDOSCOPY         Home Medications    Prior to Admission medications   Medication Sig Start Date End Date Taking? Authorizing Provider  colchicine 0.6 MG tablet Take 1 tablet (0.6 mg total) by mouth daily. 01/20/17  Yes Virgel Manifold, MD  colchicine 0.6 MG tablet Take first tablet 1 hour after your emergency department dose (this will be your full dose for your first day),  then take 1-2 tablets in divided doses daily until symptoms improved.  Once you are seeing improvement, taper down to 1 tablet daily and discontinue within 2 days of resolution of symptoms. 03/14/17   Charlesetta Shanks, MD  HYDROcodone-acetaminophen (NORCO/VICODIN) 5-325 MG tablet Take 1-2 tablets by mouth every 4 (four) hours as needed for moderate pain or severe pain. 03/14/17   Charlesetta Shanks, MD  methocarbamol (ROBAXIN) 500 MG tablet Take 1 tablet (500 mg total) by mouth 2 (two) times daily. 03/09/16   Lysbeth Penner, FNP  methylPREDNISolone (MEDROL DOSEPAK) 4 MG TBPK tablet Take 6-5-4-3-2-1 po qd 03/09/16   Lysbeth Penner, FNP  oxyCODONE-acetaminophen (PERCOCET/ROXICET) 5-325 MG tablet  Take 1 tablet by mouth every 4 (four) hours as needed for severe pain. 01/20/17   Virgel Manifold, MD  pravastatin (PRAVACHOL) 20 MG tablet Take 1 tablet (20 mg total) by mouth daily. 11/06/15   Golden Circle, FNP  predniSONE (DELTASONE) 20 MG tablet Take 2 tablets (40 mg total) by mouth daily. 01/20/17   Virgel Manifold, MD  tamsulosin (FLOMAX) 0.4 MG CAPS capsule Take 1 capsule (0.4 mg total) by mouth daily. 10/31/15   Golden Circle, FNP    Family History Family History  Problem Relation Age of Onset  . Healthy Mother   . Healthy Father   . Colon cancer Neg Hx     Social History Social History   Tobacco Use   . Smoking status: Never Smoker  . Smokeless tobacco: Never Used  Substance Use Topics  . Alcohol use: Yes    Comment: occasional; 1-2 drinks per week per pt  . Drug use: No     Allergies   Amoxicillin; Aspirin; Penicillins; and Allopurinol   Review of Systems Review of Systems 10 Systems reviewed and are negative for acute change except as noted in the HPI.   Physical Exam Updated Vital Signs BP (!) 104/59 (BP Location: Right Arm)   Pulse 79   Temp 98.2 F (36.8 C) (Oral)   Resp 16   Ht 5\' 8"  (1.727 m)   Wt 129.3 kg (285 lb)   SpO2 98%   BMI 43.33 kg/m   Physical Exam  Constitutional: He is oriented to person, place, and time.  Patient is alert and nontoxic.  No acute distress.  Obesity.  HENT:  Head: Normocephalic and atraumatic.  Eyes: EOM are normal.  Cardiovascular: Normal rate, regular rhythm, normal heart sounds and intact distal pulses.  Pulmonary/Chest: Effort normal and breath sounds normal.  Musculoskeletal:  Patient has moderate effusion of posterior elbow.  No erythema.  This is tender to light to moderate palpation over the olecranon area.  Antecubital fossa is nontender.  This is painful to range of motion but patient can flex and extend.  This does not cause radiation or pain into the hand or forearm.  Normal neurovascular exam.  See attached images.  Neurological: He is alert and oriented to person, place, and time. No cranial nerve deficit. He exhibits normal muscle tone. Coordination normal.  Skin: Skin is warm and dry.  Psychiatric: He has a normal mood and affect.         ED Treatments / Results  Labs (all labs ordered are listed, but only abnormal results are displayed) Labs Reviewed - No data to display  EKG  EKG Interpretation None       Radiology Dg Elbow Complete Left  Result Date: 03/14/2017 CLINICAL DATA:  Gout.  Left elbow pain.  No reported injury. EXAM: LEFT ELBOW - COMPLETE 3+ VIEW COMPARISON:  10/24/2012 left elbow  radiographs FINDINGS: There is a moderate left elbow joint effusion. No fracture or dislocation. No suspicious focal osseous lesions. No significant degenerative or erosive arthropathy. Tiny enthesophytes at the medial and lateral epicondyles. No radiopaque foreign body. No tophaceous soft tissue calcifications. IMPRESSION: Moderate left elbow joint effusion. Otherwise no evidence of erosive arthropathy. Electronically Signed   By: Ilona Sorrel M.D.   On: 03/14/2017 07:21    Procedures Procedures (including critical care time)  Medications Ordered in ED Medications  colchicine tablet 1.2 mg (1.2 mg Oral Given 03/14/17 0906)     Initial Impression / Assessment and Plan /  ED Course  I have reviewed the triage vital signs and the nursing notes.  Pertinent labs & imaging results that were available during my care of the patient were reviewed by me and considered in my medical decision making (see chart for details).     Final Clinical Impressions(s) / ED Diagnoses   Final diagnoses:  Acute gout of left elbow, unspecified cause  Most suspicious of gout at this time.  Patient has prior history.  Joint is painful and has superficial tenderness without apparent intra-articular severe pain.  She is clinically otherwise well.  He reports he has had good response to colchicine in the past.  No history of renal insufficiency or hepatic disease.  Patient denies being diabetic although reports he has been told he is borderline.  Will initiate treatment with colchicine and hydrocodone for pain.  Patient is counseled on signs and symptoms for which to return that would suggest septic arthritis which I do not suspect at this time.  Patient is counseled to follow-up with PCP early this week for recheck.  ED Discharge Orders        Ordered    colchicine 0.6 MG tablet     03/14/17 0900    HYDROcodone-acetaminophen (NORCO/VICODIN) 5-325 MG tablet  Every 4 hours PRN     03/14/17 0900       Charlesetta Shanks, MD 03/14/17 [phone]

## 2017-03-14 NOTE — ED Triage Notes (Signed)
C/o right elbow pain that started on Friday but states worse on Sat and today. States pain with movement. Swelling and redness noted to right elbow. Denies any injury but states a history of gout. Denies any fevers.

## 2017-05-29 ENCOUNTER — Encounter (HOSPITAL_BASED_OUTPATIENT_CLINIC_OR_DEPARTMENT_OTHER): Payer: Self-pay | Admitting: Emergency Medicine

## 2017-05-29 ENCOUNTER — Emergency Department (HOSPITAL_BASED_OUTPATIENT_CLINIC_OR_DEPARTMENT_OTHER)
Admission: EM | Admit: 2017-05-29 | Discharge: 2017-05-29 | Disposition: A | Discharge status: Home / Self Care | Payer: Self-pay | Attending: Emergency Medicine | Admitting: Emergency Medicine

## 2017-05-29 ENCOUNTER — Other Ambulatory Visit: Payer: Self-pay

## 2017-05-29 DIAGNOSIS — L509 Urticaria, unspecified: Secondary | ICD-10-CM | POA: Insufficient documentation

## 2017-05-29 DIAGNOSIS — Z79899 Other long term (current) drug therapy: Secondary | ICD-10-CM | POA: Insufficient documentation

## 2017-05-29 DIAGNOSIS — J45909 Unspecified asthma, uncomplicated: Secondary | ICD-10-CM | POA: Insufficient documentation

## 2017-05-29 DIAGNOSIS — E119 Type 2 diabetes mellitus without complications: Secondary | ICD-10-CM | POA: Insufficient documentation

## 2017-05-29 LAB — CBC WITH DIFFERENTIAL/PLATELET
Basophils Absolute: 0 10*3/uL (ref 0.0–0.1)
Basophils Relative: 0 %
EOS PCT: 3 %
Eosinophils Absolute: 0.2 10*3/uL (ref 0.0–0.7)
HCT: 39.2 % (ref 39.0–52.0)
Hemoglobin: 13.6 g/dL (ref 13.0–17.0)
LYMPHS ABS: 2 10*3/uL (ref 0.7–4.0)
Lymphocytes Relative: 22 %
MCH: 30.6 pg (ref 26.0–34.0)
MCHC: 34.7 g/dL (ref 30.0–36.0)
MCV: 88.3 fL (ref 78.0–100.0)
MONOS PCT: 7 %
Monocytes Absolute: 0.6 10*3/uL (ref 0.1–1.0)
Neutro Abs: 6.1 10*3/uL (ref 1.7–7.7)
Neutrophils Relative %: 68 %
PLATELETS: 239 10*3/uL (ref 150–400)
RBC: 4.44 MIL/uL (ref 4.22–5.81)
RDW: 12.8 % (ref 11.5–15.5)
WBC: 8.9 10*3/uL (ref 4.0–10.5)

## 2017-05-29 LAB — COMPREHENSIVE METABOLIC PANEL
ALT: 34 U/L (ref 17–63)
AST: 43 U/L — AB (ref 15–41)
Albumin: 3.9 g/dL (ref 3.5–5.0)
Alkaline Phosphatase: 48 U/L (ref 38–126)
Anion gap: 11 (ref 5–15)
BUN: 15 mg/dL (ref 6–20)
CHLORIDE: 104 mmol/L (ref 101–111)
CO2: 21 mmol/L — AB (ref 22–32)
CREATININE: 0.9 mg/dL (ref 0.61–1.24)
Calcium: 8.8 mg/dL — ABNORMAL LOW (ref 8.9–10.3)
GFR calc non Af Amer: >60 mL/min (ref >60)
Glucose, Bld: 133 mg/dL — ABNORMAL HIGH (ref 65–99)
POTASSIUM: 3.7 mmol/L (ref 3.5–5.1)
SODIUM: 136 mmol/L (ref 135–145)
Total Bilirubin: 0.5 mg/dL (ref 0.3–1.2)
Total Protein: 7 g/dL (ref 6.5–8.1)

## 2017-05-29 MED ORDER — METHYLPREDNISOLONE SODIUM SUCC 125 MG IJ SOLR
125.0000 mg | Freq: Once | INTRAMUSCULAR | Status: AC
  Administered 2017-05-29: 125 mg via INTRAVENOUS
  Filled 2017-05-29: qty 2

## 2017-05-29 MED ORDER — PREDNISONE 10 MG PO TABS: ORAL_TABLET | ORAL | 0 refills | Status: DC

## 2017-05-29 MED ORDER — FAMOTIDINE IN NACL 20-0.9 MG/50ML-% IV SOLN
20.0000 mg | Freq: Once | INTRAVENOUS | Status: AC
Start: 2017-05-29 — End: 2017-05-29
  Administered 2017-05-29: 20 mg via INTRAVENOUS
  Filled 2017-05-29: qty 50

## 2017-05-29 MED ORDER — DIPHENHYDRAMINE HCL 50 MG/ML IJ SOLN
50.0000 mg | Freq: Once | INTRAMUSCULAR | Status: AC
  Administered 2017-05-29: 50 mg via INTRAVENOUS
  Filled 2017-05-29: qty 1

## 2017-05-29 NOTE — ED Triage Notes (Addendum)
Patient states that he is having a rash all over his body, reports that it is getting worse tonight  - denies any Trouble breathing of swallowing

## 2017-05-29 NOTE — ED Provider Notes (Signed)
[facility] EMERGENCY DEPARTMENT Provider Note   CSN: [id number] Arrival date & time: 05/29/17  2138     History   Chief Complaint Chief Complaint  Patient presents with  . Rash    HPI David Wall is a 62 y.o. male history of diabetes, asthma here presenting with rash.  Patient has hives that started about 3 days ago.  It started in his right arm and now spreads throughout his torso and back.  Patient denies any new medicines or shampoos or detergents.  Patient denies any exposures to ticks or any poison ivy.  Patient denies any fevers or chills.  He has tried some Benadryl with minimal relief.  The history is provided by the patient.    Past Medical History:  Diagnosis Date  . Arthritis   . Asthma    hx of in childhood  . Diabetes mellitus without complication ([place])    no tx  . GERD (gastroesophageal reflux disease)   . Gout   . H/O urinary frequency    as noted in H&P per Dr Loanne Drilling 04/25/2014     Patient Active Problem List   Diagnosis Date Noted  . Osteoarthritis of right knee 06/15/2014  . Status post total right knee replacement 06/15/2014  . Sleep disorder 04/25/2014  . Inguinal hernia 12/21/2013  . Diabetes ([place]) 10/11/2013  . Routine general medical examination at a health care facility 10/10/2013  . Screening for prostate cancer 10/10/2013  . GOUT 04/26/2008  . RECTAL BLEEDING 04/26/2008  . CHEST PAIN UNSPECIFIED 04/25/2007  . HYPERCHOLESTEROLEMIA 01/26/2007  . UNSPECIFIED CHRONIC ISCHEMIC HEART DISEASE 01/26/2007  . CERVICAL RADICULOPATHY, RIGHT 01/26/2007  . INSOMNIA 01/26/2007    Past Surgical History:  Procedure Laterality Date  . EYE SURGERY     laser surgery bilat   . JOINT REPLACEMENT    . TOTAL KNEE ARTHROPLASTY Right 06/15/2014   Procedure: RIGHT TOTAL KNEE ARTHROPLASTY;  Surgeon: Mcarthur Rossetti, MD;  Location: [facility];  Service: Orthopedics;  Laterality: Right;  . UPPER GI ENDOSCOPY          Home Medications     Prior to Admission medications   Medication Sig Start Date End Date Taking? Authorizing Provider  colchicine 0.6 MG tablet Take 1 tablet (0.6 mg total) by mouth daily. 01/20/17   Virgel Manifold, MD  colchicine 0.6 MG tablet Take first tablet 1 hour after your emergency department dose (this will be your full dose for your first day),  then take 1-2 tablets in divided doses daily until symptoms improved.  Once you are seeing improvement, taper down to 1 tablet daily and discontinue within 2 days of resolution of symptoms. 03/14/17   Charlesetta Shanks, MD  HYDROcodone-acetaminophen (NORCO/VICODIN) 5-325 MG tablet Take 1-2 tablets by mouth every 4 (four) hours as needed for moderate pain or severe pain. 03/14/17   Charlesetta Shanks, MD  methocarbamol (ROBAXIN) 500 MG tablet Take 1 tablet (500 mg total) by mouth 2 (two) times daily. 03/09/16   Lysbeth Penner, FNP  methylPREDNISolone (MEDROL DOSEPAK) 4 MG TBPK tablet Take 6-5-4-3-2-1 po qd 03/09/16   Lysbeth Penner, FNP  oxyCODONE-acetaminophen (PERCOCET/ROXICET) 5-325 MG tablet Take 1 tablet by mouth every 4 (four) hours as needed for severe pain. 01/20/17   Virgel Manifold, MD  pravastatin (PRAVACHOL) 20 MG tablet Take 1 tablet (20 mg total) by mouth daily. 11/06/15   Golden Circle, FNP  predniSONE (DELTASONE) 20 MG tablet Take 2 tablets (40 mg total) by mouth  daily. 01/20/17   Virgel Manifold, MD  tamsulosin (FLOMAX) 0.4 MG CAPS capsule Take 1 capsule (0.4 mg total) by mouth daily. 10/31/15   Golden Circle, FNP    Family History Family History  Problem Relation Age of Onset  . Healthy Mother   . Healthy Father   . Colon cancer Neg Hx     Social History Social History   Tobacco Use  . Smoking status: Never Smoker  . Smokeless tobacco: Never Used  Substance Use Topics  . Alcohol use: Yes    Comment: occasional; 1-2 drinks per week per pt  . Drug use: No     Allergies   Amoxicillin; Aspirin; Penicillins; and Allopurinol   Review of  Systems Review of Systems  Skin: Positive for rash.  All other systems reviewed and are negative.    Physical Exam Updated Vital Signs BP 123/69 (BP Location: Left Arm)   Pulse 83   Temp 98.3 F (36.8 C) (Oral)   Resp 18   Ht 5\' 8"  (1.727 m)   Wt 129.3 kg (285 lb)   SpO2 100%   BMI 43.33 kg/m   Physical Exam  Constitutional: He is oriented to person, place, and time. He appears well-developed and well-nourished.  HENT:  Head: Normocephalic.  Mouth/Throat: Oropharynx is clear and moist.  No oral lesions or rash   Eyes: Pupils are equal, round, and reactive to light. Conjunctivae and EOM are normal.  Neck: Normal range of motion. Neck supple.  Cardiovascular: Normal rate, regular rhythm and normal heart sounds.  Pulmonary/Chest: Effort normal and breath sounds normal. No stridor. No respiratory distress. He has no wheezes.  Abdominal: Soft. Bowel sounds are normal. He exhibits no distension.  Musculoskeletal: Normal range of motion.  Neurological: He is oriented to person, place, and time.  Skin: Capillary refill takes less than 2 seconds.  Hives mainly on R arm, torso, L arm, R flank. No vesicular lesions   Psychiatric: He has a normal mood and affect.  Nursing note and vitals reviewed.    ED Treatments / Results  Labs (all labs ordered are listed, but only abnormal results are displayed) Labs Reviewed  COMPREHENSIVE METABOLIC PANEL - Abnormal; Notable for the following components:      Result Value   CO2 21 (*)    Glucose, Bld 133 (*)    Calcium 8.8 (*)    AST 43 (*)    All other components within normal limits  CBC WITH DIFFERENTIAL/PLATELET    EKG None  Radiology No results found.  Procedures Procedures (including critical care time)  Medications Ordered in ED Medications  famotidine (PEPCID) IVPB 20 mg premix (20 mg Intravenous New Bag/Given 05/29/17 2308)  methylPREDNISolone sodium succinate (SOLU-MEDROL) 125 mg/2 mL injection 125 mg (125 mg  Intravenous Given 05/29/17 2303)  diphenhydrAMINE (BENADRYL) injection 50 mg (50 mg Intravenous Given 05/29/17 2304)     Initial Impression / Assessment and Plan / ED Course  I have reviewed the triage vital signs and the nursing notes.  Pertinent labs & imaging results that were available during my care of the patient were reviewed by me and considered in my medical decision making (see chart for details).     David Wall is a 62 y.o. male here with hives. No new meds or shampoos. Likely environmental allergies. Will give steroids, benadryl, pepcid. Can get outpatient allergy testing.   11:25 PM Rash improved. Labs unremarkable. Stable for discharge. Will dc home with steroids, benadryl.  Final Clinical Impressions(s) / ED Diagnoses   Final diagnoses:  None    ED Discharge Orders    None       Drenda Freeze, MD 05/29/17 2326

## 2017-05-29 NOTE — Discharge Instructions (Signed)
Take prednisone as prescribed.   Take benadryl 50 mg every 6 hrs for itchiness and rash.   See your doctor. Consider allergist referral for allergy testing   Return to ER if you have worse rash, trouble breathing.

## 2017-05-30 NOTE — ED Notes (Signed)
Pt given Rx x 1 for prednisone

## 2017-10-10 ENCOUNTER — Other Ambulatory Visit: Payer: Self-pay

## 2017-10-10 ENCOUNTER — Emergency Department (HOSPITAL_BASED_OUTPATIENT_CLINIC_OR_DEPARTMENT_OTHER)
Admission: EM | Admit: 2017-10-10 | Discharge: 2017-10-10 | Disposition: A | Discharge status: Home / Self Care | Payer: Self-pay | Attending: Emergency Medicine | Admitting: Emergency Medicine

## 2017-10-10 ENCOUNTER — Encounter (HOSPITAL_BASED_OUTPATIENT_CLINIC_OR_DEPARTMENT_OTHER): Payer: Self-pay | Admitting: *Deleted

## 2017-10-10 DIAGNOSIS — Z79899 Other long term (current) drug therapy: Secondary | ICD-10-CM | POA: Insufficient documentation

## 2017-10-10 DIAGNOSIS — M10079 Idiopathic gout, unspecified ankle and foot: Secondary | ICD-10-CM

## 2017-10-10 DIAGNOSIS — E119 Type 2 diabetes mellitus without complications: Secondary | ICD-10-CM | POA: Insufficient documentation

## 2017-10-10 DIAGNOSIS — M10072 Idiopathic gout, left ankle and foot: Secondary | ICD-10-CM | POA: Insufficient documentation

## 2017-10-10 DIAGNOSIS — J45909 Unspecified asthma, uncomplicated: Secondary | ICD-10-CM | POA: Insufficient documentation

## 2017-10-10 MED ORDER — COLCHICINE 0.6 MG PO TABS: ORAL_TABLET | ORAL | 0 refills | Status: DC

## 2017-10-10 MED ORDER — HYDROCODONE-ACETAMINOPHEN 5-325 MG PO TABS
1.0000 | ORAL_TABLET | Freq: Once | ORAL | Status: AC
  Administered 2017-10-10: 1 via ORAL
  Filled 2017-10-10: qty 1

## 2017-10-10 MED ORDER — COLCHICINE 0.6 MG PO TABS
1.2000 mg | ORAL_TABLET | Freq: Once | ORAL | Status: AC
  Administered 2017-10-10: 1.2 mg via ORAL
  Filled 2017-10-10: qty 2

## 2017-10-10 MED ORDER — HYDROCODONE-ACETAMINOPHEN 5-325 MG PO TABS: 1.0000 | ORAL_TABLET | ORAL | 0 refills | Status: DC | PRN

## 2017-10-10 NOTE — ED Provider Notes (Signed)
[facility] EMERGENCY DEPARTMENT Provider Note   CSN: [id number] Arrival date & time: 10/10/17  1438     History   Chief Complaint Chief Complaint  Patient presents with  . Ankle Pain    HPI David Wall is a 62 y.o. male.  HPI Patient reports he has developed ankle pain left greater than right over the past 2 days.  He has been trying to elevate and stay off of them.  Reports pain is the same as prior gout attacks.  He reports sensitivity to light touch particularly on the ankle and top of the foot on the left.  No Associated injury.  Patient felt generally well without any constitutional symptoms.  Patient was seen by myself 7 months ago with a attack of the elbow.  He reports that the combination of colchicine and Vicodin worked very well for him.  He had no subsequent problems.  He denies he is had any recurrent treatments with colchicine or frequent use of NSAIDs. Past Medical History:  Diagnosis Date  . Arthritis   . Asthma    hx of in childhood  . Diabetes mellitus without complication ([place])    no tx  . GERD (gastroesophageal reflux disease)   . Gout   . H/O urinary frequency    as noted in H&P per Dr Loanne Drilling 04/25/2014     Patient Active Problem List   Diagnosis Date Noted  . Osteoarthritis of right knee 06/15/2014  . Status post total right knee replacement 06/15/2014  . Sleep disorder 04/25/2014  . Inguinal hernia 12/21/2013  . Diabetes ([place]) 10/11/2013  . Routine general medical examination at a health care facility 10/10/2013  . Screening for prostate cancer 10/10/2013  . GOUT 04/26/2008  . RECTAL BLEEDING 04/26/2008  . CHEST PAIN UNSPECIFIED 04/25/2007  . HYPERCHOLESTEROLEMIA 01/26/2007  . UNSPECIFIED CHRONIC ISCHEMIC HEART DISEASE 01/26/2007  . CERVICAL RADICULOPATHY, RIGHT 01/26/2007  . INSOMNIA 01/26/2007    Past Surgical History:  Procedure Laterality Date  . EYE SURGERY     laser surgery bilat   . JOINT REPLACEMENT    . TOTAL KNEE  ARTHROPLASTY Right 06/15/2014   Procedure: RIGHT TOTAL KNEE ARTHROPLASTY;  Surgeon: Mcarthur Rossetti, MD;  Location: [facility];  Service: Orthopedics;  Laterality: Right;  . UPPER GI ENDOSCOPY          Home Medications    Prior to Admission medications   Medication Sig Start Date End Date Taking? Authorizing Provider  colchicine 0.6 MG tablet Take 1 tablet (0.6 mg total) by mouth daily. 01/20/17   Virgel Manifold, MD  colchicine 0.6 MG tablet Take first tablet 1 hour after your emergency department dose (this will be your full dose for your first day),  then take 1-2 tablets in divided doses daily until symptoms improved.  Once you are seeing improvement, taper down to 1 tablet daily and discontinue within 2 days of resolution of symptoms. 10/10/17   Charlesetta Shanks, MD  HYDROcodone-acetaminophen (NORCO/VICODIN) 5-325 MG tablet Take 1-2 tablets by mouth every 4 (four) hours as needed for moderate pain or severe pain. 10/10/17   Charlesetta Shanks, MD  methocarbamol (ROBAXIN) 500 MG tablet Take 1 tablet (500 mg total) by mouth 2 (two) times daily. 03/09/16   Lysbeth Penner, FNP  methylPREDNISolone (MEDROL DOSEPAK) 4 MG TBPK tablet Take 6-5-4-3-2-1 po qd 03/09/16   Lysbeth Penner, FNP  oxyCODONE-acetaminophen (PERCOCET/ROXICET) 5-325 MG tablet Take 1 tablet by mouth every 4 (four) hours as needed for  severe pain. 01/20/17   Virgel Manifold, MD  pravastatin (PRAVACHOL) 20 MG tablet Take 1 tablet (20 mg total) by mouth daily. 11/06/15   Golden Circle, FNP  predniSONE (DELTASONE) 10 MG tablet Take 60 mg daily x 2 days then 40 mg daily x 2 days then 20 mg daily x 2 days 05/29/17   Drenda Freeze, MD  tamsulosin (FLOMAX) 0.4 MG CAPS capsule Take 1 capsule (0.4 mg total) by mouth daily. 10/31/15   Golden Circle, FNP    Family History Family History  Problem Relation Age of Onset  . Healthy Mother   . Healthy Father   . Colon cancer Neg Hx     Social History Social History   Tobacco  Use  . Smoking status: Never Smoker  . Smokeless tobacco: Never Used  Substance Use Topics  . Alcohol use: Yes    Comment: occasional; 1-2 drinks per week per pt  . Drug use: No     Allergies   Amoxicillin; Aspirin; Penicillins; and Allopurinol   Review of Systems Review of Systems Constitutional: No fever no chills no general malaise [place]: No cough no shortness of breath no chest pain AI: No abdominal pain nausea or vomiting  Physical Exam Updated Vital Signs BP 134/74 (BP Location: Left Arm)   Pulse 80   Temp 98.4 F (36.9 C) (Oral)   Resp 18   Ht 5\' 8"  (1.727 m)   Wt 130.2 kg   SpO2 100%   BMI 43.64 kg/m   Physical Exam  Constitutional: He is oriented to person, place, and time.  Patient is alert and clinically well in appearance.  No respiratory distress.  Moderate central obesity.  HENT:  Head: Normocephalic and atraumatic.  Eyes: EOM are normal.  Cardiovascular: Normal rate, regular rhythm, normal heart sounds and intact distal pulses.  Pulmonary/Chest: Effort normal and breath sounds normal.  Abdominal: Soft. He exhibits no distension. There is no tenderness.  Musculoskeletal:  Mild diffuse swelling of the ankle on the left.  Minimal swelling of the ankle on the right.  Diffusely, moderately tender to palpation over the soft tissues of the left ankle and the dorsum of the foot.  Patient is neurovascularly intact.  No tenderness of the calves.  Neurological: He is alert and oriented to person, place, and time. He exhibits normal muscle tone. Coordination normal.  Skin: Skin is warm and dry.  Psychiatric: He has a normal mood and affect.     ED Treatments / Results  Labs (all labs ordered are listed, but only abnormal results are displayed) Labs Reviewed - No data to display  EKG None  Radiology No results found.  Procedures Procedures (including critical care time)  Medications Ordered in ED Medications  colchicine tablet 1.2 mg (1.2 mg Oral  Given 10/10/17 1517)  HYDROcodone-acetaminophen (NORCO/VICODIN) 5-325 MG per tablet 1 tablet (1 tablet Oral Given 10/10/17 1517)     Initial Impression / Assessment and Plan / ED Course  I have reviewed the triage vital signs and the nursing notes.  Pertinent labs & imaging results that were available during my care of the patient were reviewed by me and considered in my medical decision making (see chart for details).      Final Clinical Impressions(s) / ED Diagnoses   Final diagnoses:  Acute idiopathic gout of ankle, unspecified laterality   Patient clinically well.  He identifies quality of pain and swelling the same as prior gout attacks.  No associated injuries, no  suggestion of infectious etiology.  Has not had recurrent or prolonged use of NSAID or colchicine.  He had good response to colchicine with last gout attack.  Renal function normal in May.  Review of systems no indication of renal dysfunction or significant risk factors for interim AKI.  Will give patient another course of colchicine and Vicodin.  He is counseled on importance of following up with his PCP for continued monitoring of basic health care and renal function using medication such as colchicine or other NSAID. ED Discharge Orders         Ordered    colchicine 0.6 MG tablet     10/10/17 1517    HYDROcodone-acetaminophen (NORCO/VICODIN) 5-325 MG tablet  Every 4 hours PRN     10/10/17 1517           Charlesetta Shanks, MD 10/10/17 1523

## 2017-10-10 NOTE — Discharge Instructions (Signed)
1.  You were given your first dose of colchicine in the emergency department.  Take one 0.6 mg tablet an hour after your visit.  Following days, take 1 to 2 tablets daily.  As soon as pain is improving, taper down to 1 tablet daily for 2-3 more days and then discontinue.  Do not take other anti-inflammatories of the NSAID category while taking colchicine.  This is medication such as ibuprofen\Aleve\Motrin\naproxen\meloxicam. 2.  You need to follow-up with your family doctor.  Kidney function should periodically be monitored to make sure it remains normal while taking medication such as colchicine or other anti-inflammatories.  Return to the emergency department if you develop fever, worsening swelling worsening pain, general illness or other concerning symptoms.

## 2017-10-10 NOTE — ED Triage Notes (Signed)
Pt reports Hx of gout. C/o pain in left ankle x 2 days. Pain and swelling

## 2018-01-31 DIAGNOSIS — H40013 Open angle with borderline findings, low risk, bilateral: Secondary | ICD-10-CM | POA: Diagnosis not present

## 2018-02-20 IMAGING — MR MR CERVICAL SPINE W/O CM
4 of 5 series · 28 of 48 positions shown · non-contrast
Comparison: Radiographs dated 07/09/2015

CLINICAL DATA: Neck pain and right arm pain and numbness for 3
weeks.

EXAM:
MRI CERVICAL SPINE WITHOUT CONTRAST
TECHNIQUE: Multiplanar, multisequence MR imaging of the cervical spine was
performed. No intravenous contrast was administered.

[Series 6: T1 · sagittal · 3.0mm · 0.66mm/px · 7 of 15 slices shown]
[im 1/15]
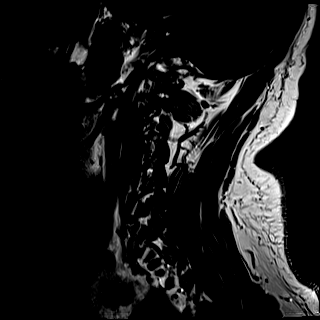
[im 3/15]
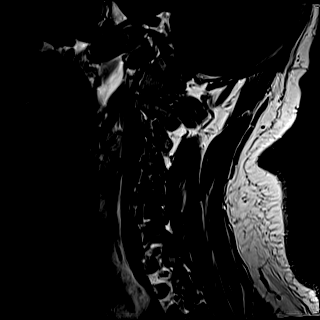
[im 5/15]
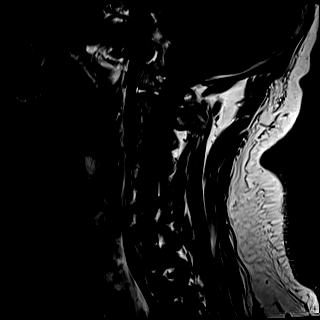
[im 8/15]
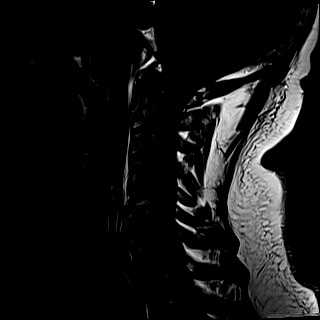
[im 10/15]
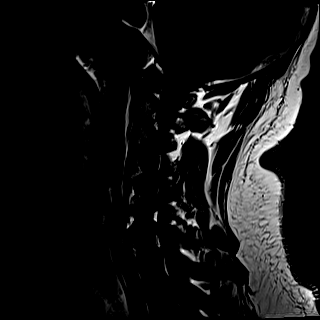
[im 12/15]
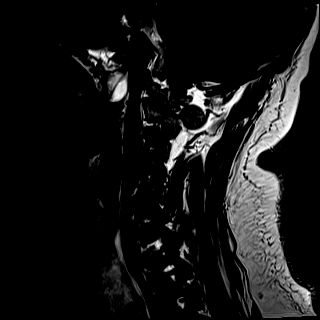
[im 15/15]
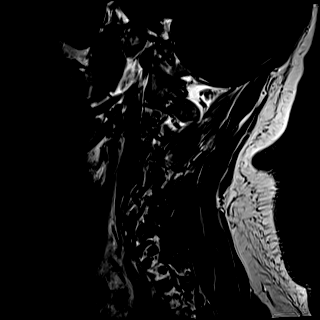

[Series 7: T2 · sagittal · 3.0mm · 0.55mm/px · 7 of 15 slices shown (1 of 2)]
[im 1/15]
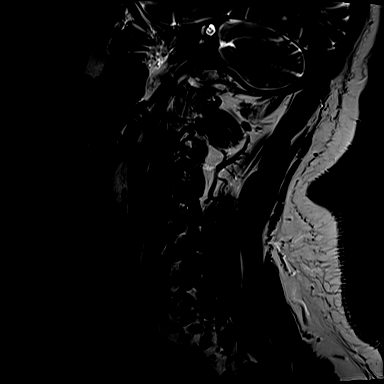
[im 3/15]
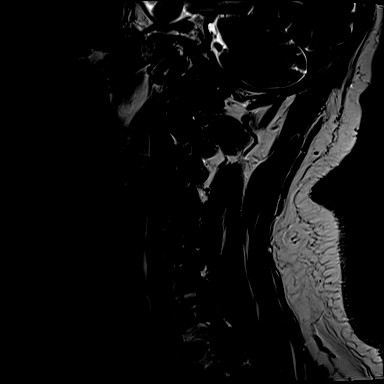
[im 5/15]
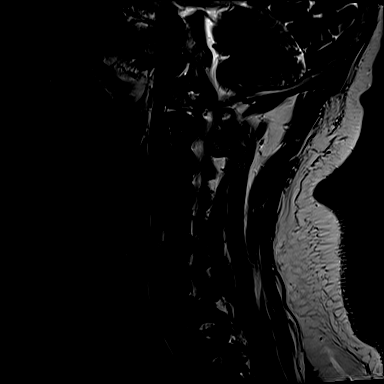
[im 8/15]
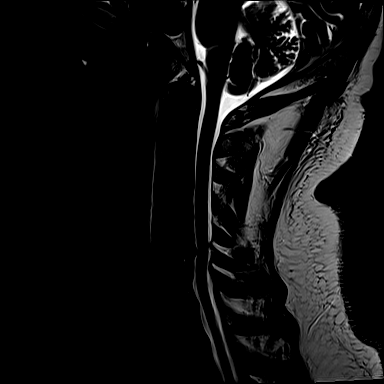
[im 10/15]
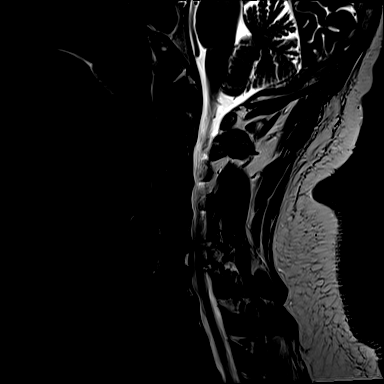
[im 12/15]
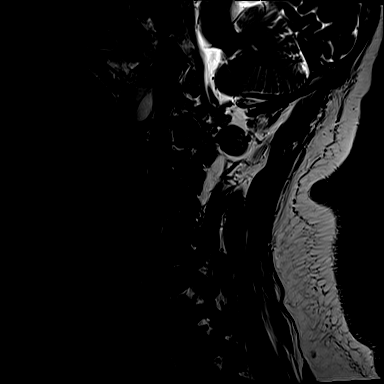
[im 15/15]
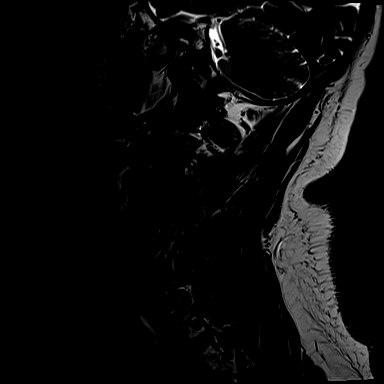

[Series 8: STIR · sagittal · 3.0mm · 0.33mm/px · 6 of 15 slices shown]
[im 1/15]
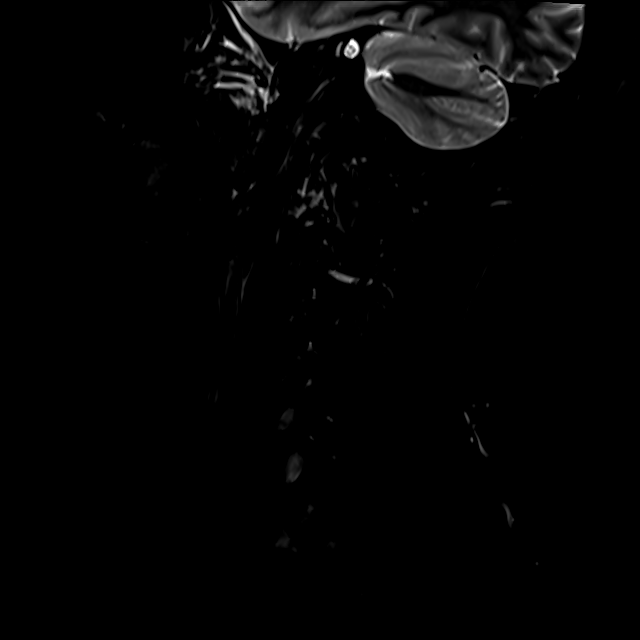
[im 3/15]
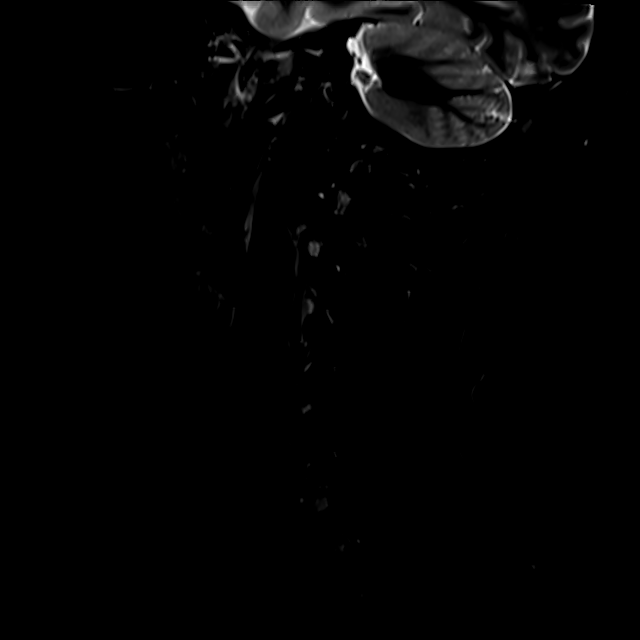
[im 6/15]
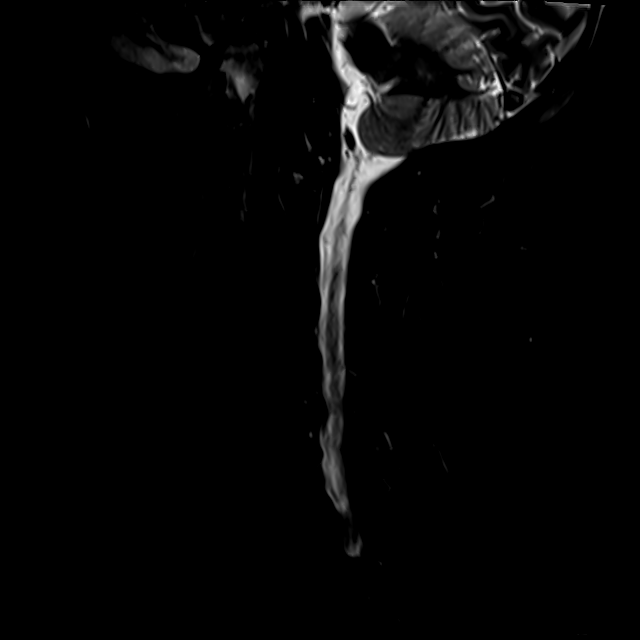
[im 9/15]
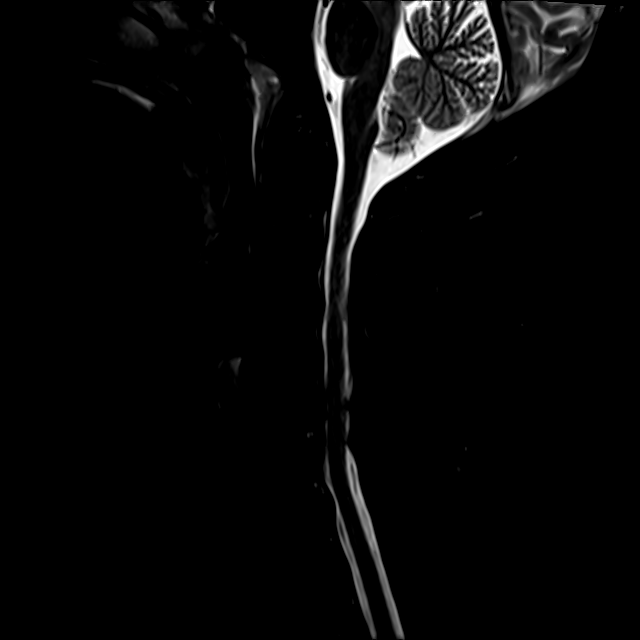
[im 12/15]
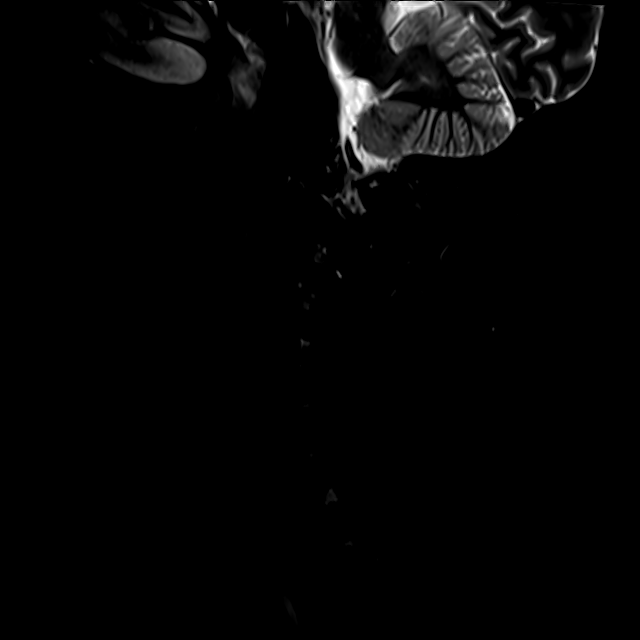
[im 15/15]
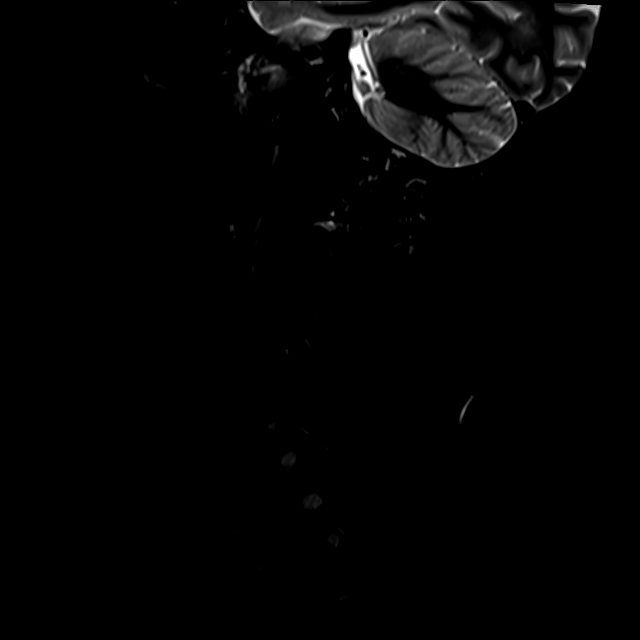

[Series 9: T2 · axial · 3.0mm · 0.53mm/px · z∈[-83,+20]mm · 8 of 33 slices shown (2 of 2)]
[im 1/33]
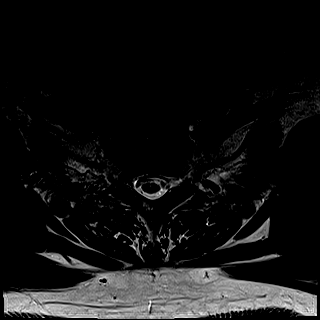
[im 5/33]
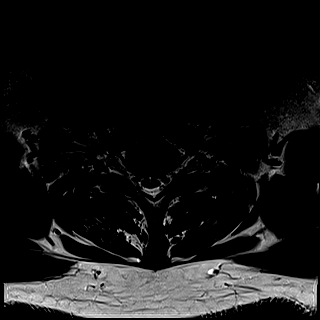
[im 10/33]
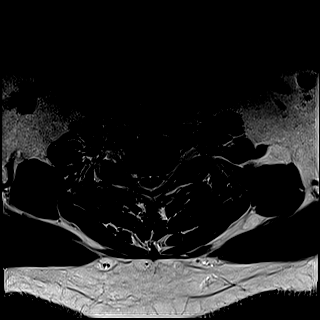
[im 15/33]
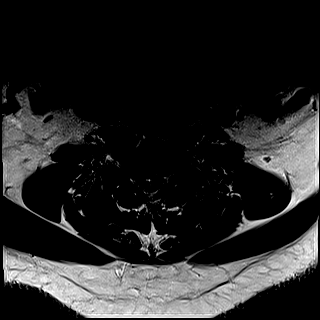
[im 18/33]
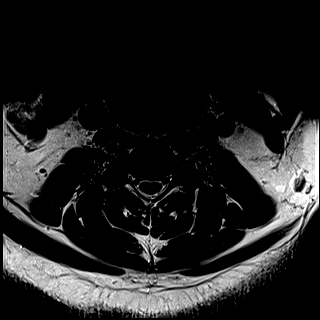
[im 23/33]
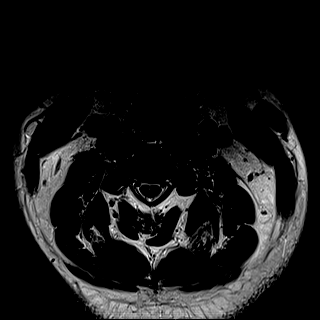
[im 28/33]
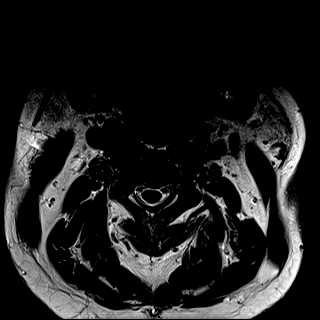
[im 33/33]
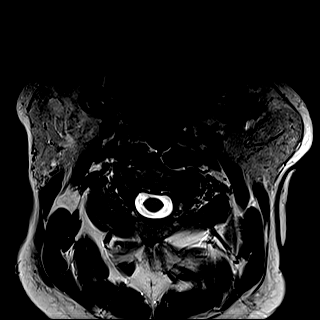

[28 of 48 positions shown; findings below may reference images not displayed]

FINDINGS: Alignment: 2 mm retrolisthesis of C5 on C6.

Vertebrae: Degenerative changes of the vertebral endplates at C5-6
and C6-7.

Cord: No mass lesion or myelopathy of the spinal cord.

Posterior Fossa, vertebral arteries, paraspinal tissues: Normal.

Disc levels:

Craniocervical junction through C4-5:  No significant abnormalities.

C5-6: Disc space narrowing with a broad-based disc osteophyte
complex asymmetric to the right creating with a small central
subligamentous disc protrusion slightly indenting the ventral aspect
of the spinal cord without myelopathy. Severe bilateral foraminal
stenosis, worse on the right than the left.

C6-7: Small broad-based disc osteophyte complex extending into both
neural foramina with moderate bilateral foraminal stenosis.

C7-T1:  Small broad-based disc bulge without neural impingement.

T1-2:  Negative.
IMPRESSION: Degenerative disc disease at C5-6 with severe bilateral foraminal
stenosis, right greater than left which could affect either or both
C6 nerves.

Degenerative disc disease at C6-7 with moderate bilateral foraminal
stenosis.

## 2018-02-21 DIAGNOSIS — H40013 Open angle with borderline findings, low risk, bilateral: Secondary | ICD-10-CM | POA: Diagnosis not present

## 2018-07-19 ENCOUNTER — Other Ambulatory Visit: Payer: Self-pay

## 2018-07-19 ENCOUNTER — Encounter (HOSPITAL_COMMUNITY): Payer: Self-pay

## 2018-07-19 ENCOUNTER — Ambulatory Visit (HOSPITAL_COMMUNITY)
Admission: EM | Admit: 2018-07-19 | Discharge: 2018-07-19 | Disposition: A | Discharge status: Home / Self Care | Payer: BC Managed Care – PPO | Attending: Family Medicine | Admitting: Family Medicine

## 2018-07-19 DIAGNOSIS — M109 Gout, unspecified: Secondary | ICD-10-CM

## 2018-07-19 MED ORDER — COLCHICINE 0.6 MG PO TABS: ORAL_TABLET | ORAL | 0 refills | Status: DC

## 2018-07-19 MED ORDER — INDOMETHACIN 50 MG PO CAPS: 50.0000 mg | ORAL_CAPSULE | Freq: Three times a day (TID) | ORAL | 0 refills | Status: DC

## 2018-07-19 NOTE — ED Provider Notes (Signed)
[place]   [id number] 07/19/18 Arrival Time: 1856  ASSESSMENT & PLAN:  1. Acute gout of left elbow, unspecified cause    No signs of skin infection.  Meds ordered this encounter  Medications  . colchicine 0.6 MG tablet    Sig: Take two tablets as one dose followed one hour later by one tablet. No more than three tablets each day.    Dispense:  6 tablet    Refill:  0  . indomethacin (INDOCIN) 50 MG capsule    Sig: Take 1 capsule (50 mg total) by mouth 3 (three) times daily with meals.    Dispense:  15 capsule    Refill:  0    Follow-up Information    [place], FNP.   Specialties: Family Medicine, Infectious Diseases Why: As needed. Contact information: [address] [id number] [phone]           Reviewed expectations re: course of current medical issues. Questions answered. Outlined signs and symptoms indicating need for more acute intervention. Patient verbalized understanding. After Visit Summary given.  SUBJECTIVE: History from: patient. David Wall is a 63 y.o. male who reports persistent moderate pain of his left elbow; described as aching without radiation. Onset: fairly abrupt, noted approx 3 days ago. Injury/trama: no. Symptoms have progressed to a point and plateaued since beginning. No specific aggravating or alleviating factors reported. Associated symptoms: none reported. Extremity sensation changes or weakness: none. Self treatment: occasional ibuprofen without much help. History of similar: yes, "feels just like gout I've had".  Past Surgical History:  Procedure Laterality Date  . EYE SURGERY     laser surgery bilat   . JOINT REPLACEMENT    . TOTAL KNEE ARTHROPLASTY Right 06/15/2014   Procedure: RIGHT TOTAL KNEE ARTHROPLASTY;  Surgeon: Mcarthur Rossetti, MD;  Location: [facility];  Service: Orthopedics;  Laterality: Right;  . UPPER GI ENDOSCOPY       ROS: As per HPI. All other systems  negative.   OBJECTIVE:  Vitals:   07/19/18 1431 07/19/18 1433  BP:  115/90  Pulse:  72  Resp:  20  Temp:  98.6 F (37 C)  TempSrc:  Oral  SpO2:  100%  Weight: 129.3 kg     General appearance: alert; no distress HEENT: [place]; AT Neck: supple with FROM Resp: unlabored respirations Extremities: . LUE: warm and well perfused; poorly localized moderate tenderness over left elbow; without gross deformities; with mild swelling; with no bruising; ROM: normal with reported mild discomfort CV: brisk extremity capillary refill of LUE; 2+ radial pulse of RUE. Skin: warm and dry; no visible rashes Neurologic: gait normal; normal reflexes of RUE and LUE; normal sensation of RUE and LUE; normal strength of RUE and LUE Psychological: alert and cooperative; normal mood and affect  Allergies  Allergen Reactions  . Amoxicillin Shortness Of Breath  . Aspirin Shortness Of Breath  . Penicillins Shortness Of Breath  . Allopurinol Rash    Past Medical History:  Diagnosis Date  . Arthritis   . Asthma    hx of in childhood  . Diabetes mellitus without complication ([place])    no tx  . GERD (gastroesophageal reflux disease)   . Gout   . H/O urinary frequency    as noted in H&P per Dr Loanne Drilling 04/25/2014    Social History   Socioeconomic History  . Marital status: Married    Spouse name: Not on file  . Number of  children: 8  . Years of education: 59  . Highest education level: Not on file  Occupational History  . Occupation: [employer]  . Financial resource strain: Not on file  . Food insecurity    Worry: Not on file    Inability: Not on file  . Transportation needs    Medical: Not on file    Non-medical: Not on file  Tobacco Use  . Smoking status: Never Smoker  . Smokeless tobacco: Never Used  Substance and Sexual Activity  . Alcohol use: Yes    Comment: occasional; 1-2 drinks per week per pt  . Drug use: No  . Sexual activity: Not on file  Lifestyle  . Physical activity     Days per week: Not on file    Minutes per session: Not on file  . Stress: Not on file  Relationships  . Social Herbalist on phone: Not on file    Gets together: Not on file    Attends religious service: Not on file    Active member of club or organization: Not on file    Attends meetings of clubs or organizations: Not on file    Relationship status: Not on file  Other Topics Concern  . Not on file  Social History Narrative   Fun: Go to the mountains and relax   Family History  Problem Relation Age of Onset  . Healthy Mother   . Healthy Father   . Colon cancer Neg Hx    Past Surgical History:  Procedure Laterality Date  . EYE SURGERY     laser surgery bilat   . JOINT REPLACEMENT    . TOTAL KNEE ARTHROPLASTY Right 06/15/2014   Procedure: RIGHT TOTAL KNEE ARTHROPLASTY;  Surgeon: Mcarthur Rossetti, MD;  Location: [facility];  Service: Orthopedics;  Laterality: Right;  . UPPER GI ENDOSCOPY        Vanessa Kick, MD 07/19/18 1510

## 2018-07-19 NOTE — ED Triage Notes (Signed)
Pt states he has gout in his left elbow.

## 2018-08-19 ENCOUNTER — Other Ambulatory Visit: Payer: Self-pay | Admitting: Family

## 2018-08-19 ENCOUNTER — Encounter: Payer: Self-pay | Admitting: Family

## 2018-08-19 ENCOUNTER — Ambulatory Visit (INDEPENDENT_AMBULATORY_CARE_PROVIDER_SITE_OTHER): Payer: BC Managed Care – PPO | Admitting: Family

## 2018-08-19 ENCOUNTER — Other Ambulatory Visit (INDEPENDENT_AMBULATORY_CARE_PROVIDER_SITE_OTHER): Payer: BC Managed Care – PPO

## 2018-08-19 ENCOUNTER — Other Ambulatory Visit: Payer: Self-pay

## 2018-08-19 VITALS — BP 118/74 | HR 71 | Temp 98.0°F | Ht 68.0 in | Wt 279.0 lb

## 2018-08-19 DIAGNOSIS — Z1211 Encounter for screening for malignant neoplasm of colon: Secondary | ICD-10-CM

## 2018-08-19 DIAGNOSIS — M109 Gout, unspecified: Secondary | ICD-10-CM

## 2018-08-19 DIAGNOSIS — Z0000 Encounter for general adult medical examination without abnormal findings: Secondary | ICD-10-CM

## 2018-08-19 DIAGNOSIS — Z13220 Encounter for screening for lipoid disorders: Secondary | ICD-10-CM

## 2018-08-19 DIAGNOSIS — Z125 Encounter for screening for malignant neoplasm of prostate: Secondary | ICD-10-CM | POA: Diagnosis not present

## 2018-08-19 DIAGNOSIS — R7303 Prediabetes: Secondary | ICD-10-CM

## 2018-08-19 DIAGNOSIS — Z86010 Personal history of colonic polyps: Secondary | ICD-10-CM

## 2018-08-19 LAB — CBC WITH DIFFERENTIAL/PLATELET
Basophils Absolute: 0 10*3/uL (ref 0.0–0.1)
Basophils Relative: 0.2 % (ref 0.0–3.0)
Eosinophils Absolute: 0.3 10*3/uL (ref 0.0–0.7)
Eosinophils Relative: 2.8 % (ref 0.0–5.0)
HCT: 39.1 % (ref 39.0–52.0)
Hemoglobin: 13.2 g/dL (ref 13.0–17.0)
Lymphocytes Relative: 23.5 % (ref 12.0–46.0)
Lymphs Abs: 2.1 10*3/uL (ref 0.7–4.0)
MCHC: 33.7 g/dL (ref 30.0–36.0)
MCV: 89.6 fl (ref 78.0–100.0)
Monocytes Absolute: 0.6 10*3/uL (ref 0.1–1.0)
Monocytes Relative: 6.2 % (ref 3.0–12.0)
Neutro Abs: 6 10*3/uL (ref 1.4–7.7)
Neutrophils Relative %: 67.3 % (ref 43.0–77.0)
Platelets: 294 10*3/uL (ref 150.0–400.0)
RBC: 4.36 Mil/uL (ref 4.22–5.81)
RDW: 13.2 % (ref 11.5–15.5)
WBC: 8.9 10*3/uL (ref 4.0–10.5)

## 2018-08-19 LAB — LIPID PANEL
Cholesterol: 138 mg/dL (ref 0–200)
HDL: 24.1 mg/dL — ABNORMAL LOW (ref >39.00)
NonHDL: 114.11
Total CHOL/HDL Ratio: 6
Triglycerides: 375 mg/dL — ABNORMAL HIGH (ref 0.0–149.0)
VLDL: 75 mg/dL — ABNORMAL HIGH (ref 0.0–40.0)

## 2018-08-19 LAB — COMPREHENSIVE METABOLIC PANEL
ALT: 25 U/L (ref 0–53)
AST: 21 U/L (ref 0–37)
Albumin: 4.3 g/dL (ref 3.5–5.2)
Alkaline Phosphatase: 61 U/L (ref 39–117)
BUN: 19 mg/dL (ref 6–23)
CO2: 24 mEq/L (ref 19–32)
Calcium: 9.6 mg/dL (ref 8.4–10.5)
Chloride: 107 mEq/L (ref 96–112)
Creatinine, Ser: 0.98 mg/dL (ref 0.40–1.50)
GFR: 94.02 mL/min (ref >60.00)
Glucose, Bld: 109 mg/dL — ABNORMAL HIGH (ref 70–99)
Potassium: 4.5 mEq/L (ref 3.5–5.1)
Sodium: 140 mEq/L (ref 135–145)
Total Bilirubin: 0.3 mg/dL (ref 0.2–1.2)
Total Protein: 7 g/dL (ref 6.0–8.3)

## 2018-08-19 LAB — HEMOGLOBIN A1C: Hgb A1c MFr Bld: 6.8 % — ABNORMAL HIGH (ref 4.6–6.5)

## 2018-08-19 LAB — LDL CHOLESTEROL, DIRECT: Direct LDL: 64 mg/dL

## 2018-08-19 LAB — URIC ACID: Uric Acid, Serum: 10.6 mg/dL — ABNORMAL HIGH (ref 4.0–7.8)

## 2018-08-19 LAB — PSA: PSA: 1.17 ng/mL (ref 0.10–4.00)

## 2018-08-19 MED ORDER — PROBENECID 500 MG PO TABS: ORAL_TABLET | ORAL | 3 refills | Status: DC

## 2018-08-19 MED ORDER — INDOMETHACIN 50 MG PO CAPS: 50.0000 mg | ORAL_CAPSULE | Freq: Three times a day (TID) | ORAL | 0 refills | Status: DC

## 2018-08-19 MED ORDER — COLCHICINE 0.6 MG PO TABS: ORAL_TABLET | ORAL | 0 refills | Status: DC

## 2018-08-19 NOTE — Progress Notes (Signed)
David Wall is a 63 y.o. male with the following history as recorded in EpicCare:  Patient Active Problem List   Diagnosis Date Noted  . Osteoarthritis of right knee 06/15/2014  . Status post total right knee replacement 06/15/2014  . Sleep disorder 04/25/2014  . Inguinal hernia 12/21/2013  . Diabetes ([place]) 10/11/2013  . Routine general medical examination at a health care facility 10/10/2013  . Screening for prostate cancer 10/10/2013  . GOUT 04/26/2008  . RECTAL BLEEDING 04/26/2008  . CHEST PAIN UNSPECIFIED 04/25/2007  . HYPERCHOLESTEROLEMIA 01/26/2007  . UNSPECIFIED CHRONIC ISCHEMIC HEART DISEASE 01/26/2007  . CERVICAL RADICULOPATHY, RIGHT 01/26/2007  . INSOMNIA 01/26/2007    Current Outpatient Medications  Medication Sig Dispense Refill  . colchicine 0.6 MG tablet Take two tablets as one dose followed one hour later by one tablet. No more than three tablets each day. 20 tablet 0  . indomethacin (INDOCIN) 50 MG capsule Take 1 capsule (50 mg total) by mouth 3 (three) times daily with meals. As needed for gout flare 60 capsule 0   No current facility-administered medications for this visit.     Allergies: Amoxicillin, Aspirin, Penicillins, and Allopurinol  Past Medical History:  Diagnosis Date  . Arthritis   . Asthma    hx of in childhood  . Diabetes mellitus without complication ([place])    no tx  . GERD (gastroesophageal reflux disease)   . Gout   . H/O urinary frequency    as noted in H&P per Dr Loanne Drilling 04/25/2014     Past Surgical History:  Procedure Laterality Date  . EYE SURGERY     laser surgery bilat   . JOINT REPLACEMENT    . TOTAL KNEE ARTHROPLASTY Right 06/15/2014   Procedure: RIGHT TOTAL KNEE ARTHROPLASTY;  Surgeon: Mcarthur Rossetti, MD;  Location: [facility];  Service: Orthopedics;  Laterality: Right;  . UPPER GI ENDOSCOPY      Family History  Problem Relation Age of Onset  . Healthy Mother   . Healthy Father   . Colon cancer Neg Hx     Social  History   Tobacco Use  . Smoking status: Never Smoker  . Smokeless tobacco: Never Used  Substance Use Topics  . Alcohol use: Yes    Comment: occasional; 1-2 drinks per week per pt    Subjective:  Patient presents for yearly CPE- has not been seen in our office in almost 3 years due to lack of insurance; has been having increased problems with gout flares recently- has been to the ER multiple times in the past few months; history of diet controlled diabetes- has been having increased urination- does not check his blood sugar; admits to increased fatigue- no chest pain, shortness of breath or blurred vision;  Seeing eye doctor/ getting back on track with dentist;    Review of Systems  Constitutional: Positive for malaise/fatigue.  HENT: Negative for hearing loss.   Eyes: Negative for blurred vision.  Respiratory: Negative for cough and shortness of breath.   Cardiovascular: Negative for chest pain.  Gastrointestinal: Negative for abdominal pain.  Genitourinary: Positive for frequency.  Musculoskeletal: Positive for joint pain.       Gout  Skin: Negative.   Neurological: Negative for dizziness and headaches.  Endo/Heme/Allergies: Negative.   Psychiatric/Behavioral: The patient is not nervous/anxious and does not have insomnia.        Works 3rd shift     Objective:  Vitals:   08/19/18 1345  BP: 118/74  Pulse:  71  Temp: 98 F (36.7 C)  TempSrc: Oral  SpO2: 95%  Weight: 279 lb (126.6 kg)  Height: _0  (1.727 m)    General: Well developed, well nourished, in no acute distress  Skin : Warm and dry.  Head: Normocephalic and atraumatic  Eyes: Sclera and conjunctiva clear; pupils round and reactive to light; extraocular movements intact  Ears: External normal; canals clear; tympanic membranes normal  Oropharynx: Pink, supple. No suspicious lesions  Neck: Supple without thyromegaly, adenopathy  Lungs: Respirations unlabored; clear to auscultation bilaterally without wheeze,  rales, rhonchi  CVS exam: normal rate and regular rhythm.  Abdomen: Soft; nontender; nondistended; normoactive bowel sounds; no masses or hepatosplenomegaly  Musculoskeletal: No deformities; no active joint inflammation  Extremities: No edema, cyanosis, clubbing  Vessels: Symmetric bilaterally  Neurologic: Alert and oriented; speech intact; face symmetrical; moves all extremities well; CNII-XII intact without focal deficit  Assessment:  1. PE (physical exam), annual   2. Lipid screening   3. Pre-diabetes   4. Prostate cancer screening   5. Gout, unspecified cause, unspecified chronicity, unspecified site   6. Encounter for colonoscopy due to history of adenomatous colonic polyps     Plan:  Age appropriate preventive healthcare needs addressed; encouraged regular eye doctor and dental exams; encouraged regular exercise; will update labs and refills as needed today; follow-up to be determined; Will go ahead and refill Colchicine and Indocin has he feels that another gout flare is starting; cannot take Allopurinol due to rash- will need to consider Uloric; Referral to GI for colonoscopy; Will most likely need to see sports medicine due to suspected bursitis of right elbow;  Follow-up in 3 weeks, sooner prn.    No follow-ups on file.  Orders Placed This Encounter  Procedures  . CBC w/Diff    Standing Status:   Future    Number of Occurrences:   1    Standing Expiration Date:   08/19/2019  . Comp Met (CMET)    Standing Status:   Future    Number of Occurrences:   1    Standing Expiration Date:   08/19/2019  . Lipid panel    Standing Status:   Future    Number of Occurrences:   1    Standing Expiration Date:   08/19/2019  . HgB A1c    Standing Status:   Future    Number of Occurrences:   1    Standing Expiration Date:   08/19/2019  . PSA    Standing Status:   Future    Number of Occurrences:   1    Standing Expiration Date:   08/19/2019  . Uric acid    Standing Status:   Future     Number of Occurrences:   1    Standing Expiration Date:   08/19/2019  . Ambulatory referral to Gastroenterology    Referral Priority:   Routine    Referral Type:   Consultation    Referral Reason:   Specialty Services Required    Referred to Provider:   Mauri Pole, MD    Number of Visits Requested:   1    Requested Prescriptions   Signed Prescriptions Disp Refills  . colchicine 0.6 MG tablet 20 tablet 0    Sig: Take two tablets as one dose followed one hour later by one tablet. No more than three tablets each day.  . indomethacin (INDOCIN) 50 MG capsule 60 capsule 0    Sig: Take 1 capsule (50  mg total) by mouth 3 (three) times daily with meals. As needed for gout flare

## 2018-08-22 ENCOUNTER — Other Ambulatory Visit: Payer: Self-pay | Admitting: Family

## 2018-08-22 MED ORDER — METFORMIN HCL 500 MG PO TABS: 500.0000 mg | ORAL_TABLET | Freq: Every day | ORAL | 0 refills | Status: DC

## 2018-09-07 ENCOUNTER — Ambulatory Visit (INDEPENDENT_AMBULATORY_CARE_PROVIDER_SITE_OTHER): Payer: BC Managed Care – PPO | Admitting: Orthopaedic Surgery

## 2018-09-07 ENCOUNTER — Encounter: Payer: Self-pay | Admitting: Orthopaedic Surgery

## 2018-09-07 ENCOUNTER — Other Ambulatory Visit: Payer: Self-pay

## 2018-09-07 ENCOUNTER — Ambulatory Visit: Payer: Self-pay

## 2018-09-07 VITALS — Ht 68.0 in | Wt 283.0 lb

## 2018-09-07 DIAGNOSIS — M25562 Pain in left knee: Secondary | ICD-10-CM

## 2018-09-07 DIAGNOSIS — M7021 Olecranon bursitis, right elbow: Secondary | ICD-10-CM

## 2018-09-07 MED ORDER — METHYLPREDNISOLONE ACETATE 40 MG/ML IJ SUSP
40.0000 mg | INTRAMUSCULAR | Status: AC | PRN
  Administered 2018-09-07: 40 mg via INTRA_ARTICULAR

## 2018-09-07 MED ORDER — LIDOCAINE HCL 1 % IJ SOLN
3.0000 mL | INTRAMUSCULAR | Status: AC | PRN
  Administered 2018-09-07: 3 mL

## 2018-09-07 MED ORDER — LIDOCAINE HCL 1 % IJ SOLN
1.0000 mL | INTRAMUSCULAR | Status: AC | PRN
  Administered 2018-09-07: 1 mL

## 2018-09-07 NOTE — Progress Notes (Signed)
Office Visit Note   Patient: David Wall           Date of Birth: 24-Aug-1955           MRN: [id number] Visit Date: 09/07/2018              Requested by: Marrian Salvage, [place],  [place] [address] PCP: Marrian Salvage, FNP   Assessment & Plan: Visit Diagnoses:  1. Left knee pain, unspecified chronicity   2. Olecranon bursitis, right elbow     Plan: I was able to aspirate fluid off of the right elbow olecranon bursa and it was consistent with bursitis.  I placed a small steroid injection in this area and put a compressive dressing around it.  He will remove that this evening.  I did talk about a steroid injection in his left knee given the acute nature of his pain and his x-ray findings showing a well-maintained joint space.  He agreed with trying this as well.  I counseled him about the risk and benefits of steroid injections and he watch his blood glucose closely.  He did tolerate the left knee injection well.  All question concerns were answered and addressed.  We will see him back in about a month to see how is doing overall.  Follow-Up Instructions: Return in about 4 weeks (around 10/05/2018).   Orders:  Orders Placed This Encounter  Procedures   Large Joint Inj   Medium Joint Inj   XR Knee 1-2 Views Left   No orders of the defined types were placed in this encounter.     Procedures: Large Joint Inj: L knee on 09/07/2018 11:17 AM Indications: diagnostic evaluation and pain Details: 22 G 1.5 in needle, superolateral approach  Arthrogram: No  Medications: 3 mL lidocaine 1 %; 40 mg methylPREDNISolone acetate 40 MG/ML Outcome: tolerated well, no immediate complications Procedure, treatment alternatives, risks and benefits explained, specific risks discussed. Consent was given by the patient. Immediately prior to procedure a time out was called to verify the correct patient, procedure, equipment, support staff and site/side marked as  required. Patient was prepped and draped in the usual sterile fashion.   Medium Joint Inj: R olecranon bursa on 09/07/2018 11:17 AM Medications: 1 mL lidocaine 1 %; 40 mg methylPREDNISolone acetate 40 MG/ML      Clinical Data: No additional findings.   Subjective: Chief Complaint  Patient presents with   Left Knee - Follow-up  The patient comes in today for 2 issues.  He has been dealing with left knee pain for about 2 weeks now with a burning sensation and some locking and catching.  He is also dealing with right elbow swelling over the olecranon area.  He has a history of a right total knee arthroplasty that we did for years ago.  He says the right knee is doing well.  He has had no other acute changes in medical status.  He is a prediabetic.  He is someone who is morbidly obese with a BMI of 43.  He says his elbow may have flared up after resting and armrest driving a long distance but otherwise remember injuring his right elbow in any way  HPI  Review of Systems He currently denies any headache, chest pain, shortness of breath, fever, chills, nausea, vomiting  Objective: Vital Signs: Ht 5\' 8"  (1.727 m)    Wt 283 lb (128.4 kg)    BMI 43.03 kg/m   Physical Exam  He is alert and orient x3 and in no acute distress Ortho Exam Examination of his right elbow does show swelling over the olecranon area consistent with olecranon bursitis.  There is no redness and it is not painful to him.  Examination of his left knee shows no effusion.  The knee is slightly warm though with good range of motion.  It does hurt globally but some of this is over the patella tendon as well.  His extensor mechanism is intact and the knee feels ligamentously stable. Specialty Comments:  No specialty comments available.  Imaging: No results found.   PMFS History: Patient Active Problem List   Diagnosis Date Noted   Osteoarthritis of right knee 06/15/2014   Status post total right knee replacement  06/15/2014   Sleep disorder 04/25/2014   Inguinal hernia 12/21/2013   Diabetes ([place]) 10/11/2013   Routine general medical examination at a health care facility 10/10/2013   Screening for prostate cancer 10/10/2013   GOUT 04/26/2008   RECTAL BLEEDING 04/26/2008   CHEST PAIN UNSPECIFIED 04/25/2007   HYPERCHOLESTEROLEMIA 01/26/2007   UNSPECIFIED CHRONIC ISCHEMIC HEART DISEASE 01/26/2007   CERVICAL RADICULOPATHY, RIGHT 01/26/2007   INSOMNIA 01/26/2007   Past Medical History:  Diagnosis Date   Arthritis    Asthma    hx of in childhood   Diabetes mellitus without complication ([facility])    no tx   GERD (gastroesophageal reflux disease)    Gout    H/O urinary frequency    as noted in H&P per Dr Loanne Drilling 04/25/2014     Family History  Problem Relation Age of Onset   Healthy Mother    Healthy Father    Colon cancer Neg Hx     Past Surgical History:  Procedure Laterality Date   EYE SURGERY     laser surgery bilat    JOINT REPLACEMENT     TOTAL KNEE ARTHROPLASTY Right 06/15/2014   Procedure: RIGHT TOTAL KNEE ARTHROPLASTY;  Surgeon: Mcarthur Rossetti, MD;  Location: [facility];  Service: Orthopedics;  Laterality: Right;   UPPER GI ENDOSCOPY     Social History   Occupational History   Occupation: Cook  Tobacco Use   Smoking status: Never Smoker   Smokeless tobacco: Never Used  Substance and Sexual Activity   Alcohol use: Yes    Comment: occasional; 1-2 drinks per week per pt   Drug use: No   Sexual activity: Not on file

## 2018-09-09 ENCOUNTER — Ambulatory Visit (INDEPENDENT_AMBULATORY_CARE_PROVIDER_SITE_OTHER): Payer: BC Managed Care – PPO | Admitting: Family

## 2018-09-09 ENCOUNTER — Ambulatory Visit: Payer: BC Managed Care – PPO | Admitting: Family

## 2018-09-09 ENCOUNTER — Other Ambulatory Visit: Payer: Self-pay

## 2018-09-09 ENCOUNTER — Encounter: Payer: Self-pay | Admitting: Family

## 2018-09-09 VITALS — BP 120/78 | HR 69 | Temp 97.9°F | Ht 68.0 in | Wt 273.6 lb

## 2018-09-09 DIAGNOSIS — Z23 Encounter for immunization: Secondary | ICD-10-CM | POA: Diagnosis not present

## 2018-09-09 DIAGNOSIS — M109 Gout, unspecified: Secondary | ICD-10-CM

## 2018-09-09 DIAGNOSIS — E119 Type 2 diabetes mellitus without complications: Secondary | ICD-10-CM | POA: Diagnosis not present

## 2018-09-09 DIAGNOSIS — E7800 Pure hypercholesterolemia, unspecified: Secondary | ICD-10-CM

## 2018-09-09 DIAGNOSIS — N401 Enlarged prostate with lower urinary tract symptoms: Secondary | ICD-10-CM

## 2018-09-09 DIAGNOSIS — R3914 Feeling of incomplete bladder emptying: Secondary | ICD-10-CM

## 2018-09-09 MED ORDER — TAMSULOSIN HCL 0.4 MG PO CAPS: 0.4000 mg | ORAL_CAPSULE | Freq: Every day | ORAL | 3 refills | Status: DC

## 2018-09-09 MED ORDER — ROSUVASTATIN CALCIUM 10 MG PO TABS: 10.0000 mg | ORAL_TABLET | Freq: Every day | ORAL | 3 refills | Status: DC

## 2018-09-09 NOTE — Patient Instructions (Signed)

## 2018-09-09 NOTE — Progress Notes (Signed)
David Wall is a 63 y.o. male with the following history as recorded in EpicCare:  Patient Active Problem List   Diagnosis Date Noted  . Osteoarthritis of right knee 06/15/2014  . Status post total right knee replacement 06/15/2014  . Sleep disorder 04/25/2014  . Inguinal hernia 12/21/2013  . Diabetes ([place]) 10/11/2013  . Routine general medical examination at a health care facility 10/10/2013  . Screening for prostate cancer 10/10/2013  . Gout 04/26/2008  . RECTAL BLEEDING 04/26/2008  . CHEST PAIN UNSPECIFIED 04/25/2007  . HYPERCHOLESTEROLEMIA 01/26/2007  . UNSPECIFIED CHRONIC ISCHEMIC HEART DISEASE 01/26/2007  . CERVICAL RADICULOPATHY, RIGHT 01/26/2007  . INSOMNIA 01/26/2007    Current Outpatient Medications  Medication Sig Dispense Refill  . colchicine 0.6 MG tablet Take two tablets as one dose followed one hour later by one tablet. No more than three tablets each day. 20 tablet 0  . indomethacin (INDOCIN) 50 MG capsule Take 1 capsule (50 mg total) by mouth 3 (three) times daily with meals. As needed for gout flare 60 capsule 0  . metFORMIN (GLUCOPHAGE) 500 MG tablet Take 1 tablet (500 mg total) by mouth daily after supper. 90 tablet 0  . probenecid (BENEMID) 500 MG tablet 1/2 tablet po bid x 1 week; then increase to 1 tablet bid; 60 tablet 3  . rosuvastatin (CRESTOR) 10 MG tablet Take 1 tablet (10 mg total) by mouth daily. 30 tablet 3  . tamsulosin (FLOMAX) 0.4 MG CAPS capsule Take 1 capsule (0.4 mg total) by mouth daily. 30 capsule 3   No current facility-administered medications for this visit.     Allergies: Amoxicillin, Aspirin, Penicillins, and Allopurinol  Past Medical History:  Diagnosis Date  . Arthritis   . Asthma    hx of in childhood  . Diabetes mellitus without complication ([place])    no tx  . GERD (gastroesophageal reflux disease)   . Gout   . H/O urinary frequency    as noted in H&P per Dr Loanne Drilling 04/25/2014     Past Surgical History:  Procedure Laterality  Date  . EYE SURGERY     laser surgery bilat   . JOINT REPLACEMENT    . TOTAL KNEE ARTHROPLASTY Right 06/15/2014   Procedure: RIGHT TOTAL KNEE ARTHROPLASTY;  Surgeon: Mcarthur Rossetti, MD;  Location: [facility];  Service: Orthopedics;  Laterality: Right;  . UPPER GI ENDOSCOPY      Family History  Problem Relation Age of Onset  . Healthy Mother   . Healthy Father   . Colon cancer Neg Hx     Social History   Tobacco Use  . Smoking status: Never Smoker  . Smokeless tobacco: Never Used  Substance Use Topics  . Alcohol use: Yes    Comment: occasional; 1-2 drinks per week per pt    Subjective:  Follow-up on chronic care needs including: 1) Gout- feels responding well to Probenecid; already noting less pain in his joints; 2) New onset diabetes- taking Metformin daily; feels that he has improved energy; not checking sugar regularly; Denies any chest pain, shortness of breath, blurred vision or headache 3) Hyperlipidemia- need to discuss medication 4) urinary frequency- sensation of incomplete bladder emptying; PSA was normal; minimal change with addition of Metformin;  Would like to get his flu shot as well;    Objective:  Vitals:   09/09/18 1122  BP: 120/78  Pulse: 69  Temp: 97.9 F (36.6 C)  TempSrc: Oral  SpO2: 97%  Weight: 273 lb 9.6 oz (  124.1 kg)  Height: 5\' 8"  (1.727 m)    General: Well developed, well nourished, in no acute distress  Skin : Warm and dry.  Head: Normocephalic and atraumatic  Lungs: Respirations unlabored; clear to auscultation bilaterally without wheeze, rales, rhonchi  CVS exam: normal rate and regular rhythm.  Neurologic: Alert and oriented; speech intact; face symmetrical; moves all extremities well; CNII-XII intact without focal deficit   Assessment:  1. Gout, unspecified cause, unspecified chronicity, unspecified site   2. Type 2 diabetes mellitus without complication, without long-term current use of insulin ([place])   3. Flu vaccine need   4.  HYPERCHOLESTEROLEMIA   5. Benign prostatic hyperplasia with incomplete bladder emptying     Plan:  1. Improving; continue same medication; re-check labs in 6 weeks; 2. Responding well to Metformin 500 mg qd; continue and re-check labs in 6 weeks; 3. Flu shot given; 4. Start Crestor 10 mg daily; plan for labs in 6 weeks; 5. Rx for Flomax- use as directed; re-check in 6 weeks.   Return in about 6 weeks (around 10/21/2018).  Orders Placed This Encounter  Procedures  . Flu Vaccine QUAD 36+ mos IM    Requested Prescriptions   Signed Prescriptions Disp Refills  . tamsulosin (FLOMAX) 0.4 MG CAPS capsule 30 capsule 3    Sig: Take 1 capsule (0.4 mg total) by mouth daily.  . rosuvastatin (CRESTOR) 10 MG tablet 30 tablet 3    Sig: Take 1 tablet (10 mg total) by mouth daily.

## 2018-10-03 ENCOUNTER — Ambulatory Visit (INDEPENDENT_AMBULATORY_CARE_PROVIDER_SITE_OTHER): Payer: BC Managed Care – PPO | Admitting: Physician Assistant

## 2018-10-03 ENCOUNTER — Encounter: Payer: Self-pay | Admitting: Physician Assistant

## 2018-10-03 VITALS — Ht 68.0 in | Wt 273.6 lb

## 2018-10-03 DIAGNOSIS — M25562 Pain in left knee: Secondary | ICD-10-CM

## 2018-10-03 MED ORDER — TRAMADOL HCL 50 MG PO TABS: 50.0000 mg | ORAL_TABLET | Freq: Four times a day (QID) | ORAL | 0 refills | Status: DC | PRN

## 2018-10-03 NOTE — Progress Notes (Signed)
HPI: [name] returns today follow-up of his right elbow and left knee.  He had aspiration of his right elbow for olecranon bursitis on 09/07/2018 and injection of the elbow and knee at that time.  States his elbow is doing well.  However he continues to have pain in the left knee mostly the medial aspect of the knee.  He notes that the knee catches at times.  And gives way on him.  He describes it as a burning sensation to the medial aspect of the knee.  Did get some relief but not total relief with the left knee injection but this was short-lived.  Review of systems see HPI otherwise negative  Physical exam: General well-developed well-nourished male no acute distress mood affect appropriate  Right elbow olecranon bursa region slight edema no abnormal warmth or erythema.  Good range of motion the elbow without pain.  Left knee: Tenderness along medial joint line positive McMurray's.  No instability valgus varus stressing no abnormal warmth erythema.  Impression: Resolving right elbow olecranon bursitis Left knee pain  Plan: Given his essentially normal radiographs with minimal arthritic findings of left knee recommend MRI to rule out meniscal tear.  Having follow-up after the MRI to go over results discuss further treatment.  In regards to the right elbow he will keep pressure off this area.  Questions were encouraged and answered at length.  We did send in some tramadol for pain.

## 2018-10-05 ENCOUNTER — Ambulatory Visit: Payer: BC Managed Care – PPO | Admitting: Orthopaedic Surgery

## 2018-10-20 ENCOUNTER — Encounter: Payer: Self-pay | Admitting: Family

## 2018-10-21 ENCOUNTER — Encounter: Payer: Self-pay | Admitting: Family

## 2018-10-21 ENCOUNTER — Other Ambulatory Visit (INDEPENDENT_AMBULATORY_CARE_PROVIDER_SITE_OTHER): Payer: BC Managed Care – PPO

## 2018-10-21 ENCOUNTER — Other Ambulatory Visit: Payer: Self-pay | Admitting: Family

## 2018-10-21 ENCOUNTER — Other Ambulatory Visit: Payer: Self-pay

## 2018-10-21 ENCOUNTER — Ambulatory Visit (INDEPENDENT_AMBULATORY_CARE_PROVIDER_SITE_OTHER): Payer: BC Managed Care – PPO | Admitting: Family

## 2018-10-21 VITALS — BP 112/56 | HR 67 | Temp 97.9°F | Ht 68.0 in | Wt 276.0 lb

## 2018-10-21 DIAGNOSIS — E119 Type 2 diabetes mellitus without complications: Secondary | ICD-10-CM

## 2018-10-21 DIAGNOSIS — Z23 Encounter for immunization: Secondary | ICD-10-CM

## 2018-10-21 DIAGNOSIS — M1A9XX0 Chronic gout, unspecified, without tophus (tophi): Secondary | ICD-10-CM

## 2018-10-21 DIAGNOSIS — E782 Mixed hyperlipidemia: Secondary | ICD-10-CM | POA: Diagnosis not present

## 2018-10-21 DIAGNOSIS — K219 Gastro-esophageal reflux disease without esophagitis: Secondary | ICD-10-CM

## 2018-10-21 LAB — LIPID PANEL
Cholesterol: 138 mg/dL (ref 0–200)
HDL: 23.1 mg/dL — ABNORMAL LOW (ref >39.00)
Total CHOL/HDL Ratio: 6
Triglycerides: 503 mg/dL — ABNORMAL HIGH (ref 0.0–149.0)

## 2018-10-21 LAB — COMPREHENSIVE METABOLIC PANEL
ALT: 35 U/L (ref 0–53)
AST: 26 U/L (ref 0–37)
Albumin: 4.3 g/dL (ref 3.5–5.2)
Alkaline Phosphatase: 63 U/L (ref 39–117)
BUN: 19 mg/dL (ref 6–23)
CO2: 26 mEq/L (ref 19–32)
Calcium: 9.8 mg/dL (ref 8.4–10.5)
Chloride: 102 mEq/L (ref 96–112)
Creatinine, Ser: 1.07 mg/dL (ref 0.40–1.50)
GFR: 84.91 mL/min (ref >60.00)
Glucose, Bld: 132 mg/dL — ABNORMAL HIGH (ref 70–99)
Potassium: 4.1 mEq/L (ref 3.5–5.1)
Sodium: 137 mEq/L (ref 135–145)
Total Bilirubin: 0.3 mg/dL (ref 0.2–1.2)
Total Protein: 7.4 g/dL (ref 6.0–8.3)

## 2018-10-21 LAB — URIC ACID: Uric Acid, Serum: 5.6 mg/dL (ref 4.0–7.8)

## 2018-10-21 LAB — LDL CHOLESTEROL, DIRECT: Direct LDL: 64 mg/dL

## 2018-10-21 LAB — HEMOGLOBIN A1C: Hgb A1c MFr Bld: 6.9 % — ABNORMAL HIGH (ref 4.6–6.5)

## 2018-10-21 MED ORDER — OMEPRAZOLE 20 MG PO CPDR: 20.0000 mg | DELAYED_RELEASE_CAPSULE | Freq: Two times a day (BID) | ORAL | 3 refills | Status: DC

## 2018-10-21 MED ORDER — FENOFIBRATE 145 MG PO TABS: 145.0000 mg | ORAL_TABLET | Freq: Every day | ORAL | 1 refills | Status: DC

## 2018-10-21 NOTE — Progress Notes (Signed)
David Wall is a 63 y.o. male with the following history as recorded in EpicCare:  Patient Active Problem List   Diagnosis Date Noted  . Osteoarthritis of right knee 06/15/2014  . Status post total right knee replacement 06/15/2014  . Sleep disorder 04/25/2014  . Inguinal hernia 12/21/2013  . Diabetes ([place]) 10/11/2013  . Routine general medical examination at a health care facility 10/10/2013  . Screening for prostate cancer 10/10/2013  . Gout 04/26/2008  . RECTAL BLEEDING 04/26/2008  . CHEST PAIN UNSPECIFIED 04/25/2007  . HYPERCHOLESTEROLEMIA 01/26/2007  . UNSPECIFIED CHRONIC ISCHEMIC HEART DISEASE 01/26/2007  . CERVICAL RADICULOPATHY, RIGHT 01/26/2007  . INSOMNIA 01/26/2007    Current Outpatient Medications  Medication Sig Dispense Refill  . colchicine 0.6 MG tablet Take two tablets as one dose followed one hour later by one tablet. No more than three tablets each day. 20 tablet 0  . indomethacin (INDOCIN) 50 MG capsule Take 1 capsule (50 mg total) by mouth 3 (three) times daily with meals. As needed for gout flare 60 capsule 0  . metFORMIN (GLUCOPHAGE) 500 MG tablet Take 1 tablet (500 mg total) by mouth daily after supper. 90 tablet 0  . probenecid (BENEMID) 500 MG tablet 1/2 tablet po bid x 1 week; then increase to 1 tablet bid; 60 tablet 3  . rosuvastatin (CRESTOR) 10 MG tablet Take 1 tablet (10 mg total) by mouth daily. 30 tablet 3  . tamsulosin (FLOMAX) 0.4 MG CAPS capsule Take 1 capsule (0.4 mg total) by mouth daily. 30 capsule 3  . traMADol (ULTRAM) 50 MG tablet Take 1 tablet (50 mg total) by mouth every 6 (six) hours as needed. 30 tablet 0  . omeprazole (PRILOSEC) 20 MG capsule Take 1 capsule (20 mg total) by mouth 2 (two) times daily before a meal. 180 capsule 3   No current facility-administered medications for this visit.     Allergies: Amoxicillin, Aspirin, Penicillins, and Allopurinol  Past Medical History:  Diagnosis Date  . Arthritis   . Asthma    hx of in  childhood  . Diabetes mellitus without complication ([place])    no tx  . GERD (gastroesophageal reflux disease)   . Gout   . H/O urinary frequency    as noted in H&P per Dr Loanne Drilling 04/25/2014     Past Surgical History:  Procedure Laterality Date  . EYE SURGERY     laser surgery bilat   . JOINT REPLACEMENT    . TOTAL KNEE ARTHROPLASTY Right 06/15/2014   Procedure: RIGHT TOTAL KNEE ARTHROPLASTY;  Surgeon: Mcarthur Rossetti, MD;  Location: [facility];  Service: Orthopedics;  Laterality: Right;  . UPPER GI ENDOSCOPY      Family History  Problem Relation Age of Onset  . Healthy Mother   . Healthy Father   . Colon cancer Neg Hx     Social History   Tobacco Use  . Smoking status: Never Smoker  . Smokeless tobacco: Never Used  Substance Use Topics  . Alcohol use: Yes    Comment: occasional; 1-2 drinks per week per pt    Subjective:  Follow-up on chronic care needs including:  1) Type 2 Diabetes- taking 500 mg Metformin daily; 2) Gout- feeling much better with addition of Probenecid; 3) Hyperlipidemia- tolerating Crestor well; no aches or pains; 4) BPH- good response to Flomax; Would like to get Shingles vaccine today;   Scheduled for MRI tomorrow- orthopedist is suspicious for tear in left knee;   Objective:  Vitals:  10/21/18 1111  BP: (!) 112/56  Pulse: 67  Temp: 97.9 F (36.6 C)  TempSrc: Oral  SpO2: 97%  Weight: 276 lb (125.2 kg)  Height: 5' 8"  (1.727 m)    General: Well developed, well nourished, in no acute distress  Skin : Warm and dry.  Head: Normocephalic and atraumatic  Eyes: Sclera and conjunctiva clear; pupils round and reactive to light; extraocular movements intact  Ears: External normal; canals clear; tympanic membranes normal  Oropharynx: Pink, supple. No suspicious lesions  Neck: Supple without thyromegaly, adenopathy  Lungs: Respirations unlabored; clear to auscultation bilaterally without wheeze, rales, rhonchi  CVS exam: normal rate and regular  rhythm.  Neurologic: Alert and oriented; speech intact; face symmetrical; moves all extremities well; CNII-XII intact without focal deficit  Assessment:  1. Type 2 diabetes mellitus without complication, without long-term current use of insulin ([place])   2. Mixed hyperlipidemia   3. Chronic gout without tophus, unspecified cause, unspecified site   4. Need for shingles vaccine   5. Gastroesophageal reflux disease, unspecified whether esophagitis present     Plan:  1. Check Hgba1c today;  2. Check lipid panel today; continue Crestor 10 mg daily; 3. Stable; check uric acid level today; 4. Shingrix #1 given- will give #2 at [facility] in 4 months; 5. Rx for Omeprazole 20 mg bid- use as needed;   Return in about 4 months (around 02/21/2019).  Orders Placed This Encounter  Procedures  . Varicella-zoster vaccine IM (Shingrix)  . Uric acid    Standing Status:   Future    Number of Occurrences:   1    Standing Expiration Date:   10/21/2019  . Lipid panel    Standing Status:   Future    Number of Occurrences:   1    Standing Expiration Date:   10/21/2019  . Comp Met (CMET)    Standing Status:   Future    Number of Occurrences:   1    Standing Expiration Date:   10/21/2019  . HgB A1c    Standing Status:   Future    Number of Occurrences:   1    Standing Expiration Date:   10/21/2019    Requested Prescriptions   Signed Prescriptions Disp Refills  . omeprazole (PRILOSEC) 20 MG capsule 180 capsule 3    Sig: Take 1 capsule (20 mg total) by mouth 2 (two) times daily before a meal.

## 2018-10-22 ENCOUNTER — Ambulatory Visit
Admission: RE | Admit: 2018-10-22 | Discharge: 2018-10-22 | Disposition: A | Discharge status: Home / Self Care | Payer: BC Managed Care – PPO | Source: Ambulatory Visit | Attending: Physician Assistant | Admitting: Physician Assistant

## 2018-10-22 DIAGNOSIS — M25562 Pain in left knee: Secondary | ICD-10-CM | POA: Diagnosis not present

## 2018-11-01 ENCOUNTER — Telehealth: Payer: Self-pay | Admitting: Family

## 2018-11-01 NOTE — Telephone Encounter (Signed)
Reason for CRM: Patient is experiencing some discomfort from medication . Back pain and stomach pain . He wants a nurse or provider to call back to see if its the side affects of the current medication    Call back number [phone]

## 2018-11-01 NOTE — Telephone Encounter (Signed)
Spoke with patient and info given. Asked him to call me back next Tuesday to let me know how he was feeling. If he doesn't call back by Wednesday then I will follow up with him.

## 2018-11-01 NOTE — Telephone Encounter (Signed)
Patient states that he was put on a Fenofibrate last week and notice some cramps after taking medication. Patient starting having abdominal pain, back pain,heartburn and loose bowels on Sunday. Patient has been advised that he will most likely need a office for the current issues. Patient states that he does have history of GERD. Please advise

## 2018-11-01 NOTE — Telephone Encounter (Signed)
Ask him to hold the Fenofibrate for now and let's see if he start to feel better. Please call us back with response in 1 week.

## 2018-11-14 ENCOUNTER — Other Ambulatory Visit: Payer: Self-pay | Admitting: Family

## 2018-12-19 ENCOUNTER — Encounter: Payer: Self-pay | Admitting: Gastroenterology

## 2019-01-30 ENCOUNTER — Other Ambulatory Visit: Payer: Self-pay | Admitting: Family

## 2019-02-21 ENCOUNTER — Encounter: Payer: Self-pay | Admitting: Family

## 2019-02-21 ENCOUNTER — Ambulatory Visit: Payer: Commercial Managed Care - PPO | Admitting: Family

## 2019-02-21 ENCOUNTER — Other Ambulatory Visit: Payer: Self-pay

## 2019-02-21 VITALS — BP 128/76 | HR 76 | Temp 98.2°F | Ht 68.0 in | Wt 275.4 lb

## 2019-02-21 DIAGNOSIS — E119 Type 2 diabetes mellitus without complications: Secondary | ICD-10-CM | POA: Diagnosis not present

## 2019-02-21 DIAGNOSIS — E782 Mixed hyperlipidemia: Secondary | ICD-10-CM

## 2019-02-21 DIAGNOSIS — Z23 Encounter for immunization: Secondary | ICD-10-CM

## 2019-02-21 DIAGNOSIS — M1A9XX0 Chronic gout, unspecified, without tophus (tophi): Secondary | ICD-10-CM

## 2019-02-21 LAB — COMPREHENSIVE METABOLIC PANEL
ALT: 25 U/L (ref 0–53)
AST: 25 U/L (ref 0–37)
Albumin: 4.2 g/dL (ref 3.5–5.2)
Alkaline Phosphatase: 62 U/L (ref 39–117)
BUN: 32 mg/dL — ABNORMAL HIGH (ref 6–23)
CO2: 21 mEq/L (ref 19–32)
Calcium: 9.3 mg/dL (ref 8.4–10.5)
Chloride: 105 mEq/L (ref 96–112)
Creatinine, Ser: 2.57 mg/dL — ABNORMAL HIGH (ref 0.40–1.50)
GFR: 30.85 mL/min — ABNORMAL LOW (ref >60.00)
Glucose, Bld: 120 mg/dL — ABNORMAL HIGH (ref 70–99)
Potassium: 3.9 mEq/L (ref 3.5–5.1)
Sodium: 138 mEq/L (ref 135–145)
Total Bilirubin: 0.3 mg/dL (ref 0.2–1.2)
Total Protein: 7.2 g/dL (ref 6.0–8.3)

## 2019-02-21 LAB — LDL CHOLESTEROL, DIRECT: Direct LDL: 59 mg/dL

## 2019-02-21 LAB — LIPID PANEL
Cholesterol: 133 mg/dL (ref 0–200)
HDL: 21.1 mg/dL — ABNORMAL LOW (ref >39.00)
Total CHOL/HDL Ratio: 6
Triglycerides: 449 mg/dL — ABNORMAL HIGH (ref 0.0–149.0)

## 2019-02-21 LAB — URIC ACID: Uric Acid, Serum: 11.3 mg/dL — ABNORMAL HIGH (ref 4.0–7.8)

## 2019-02-21 LAB — HEMOGLOBIN A1C: Hgb A1c MFr Bld: 6.5 % (ref 4.6–6.5)

## 2019-02-21 NOTE — Progress Notes (Signed)
David Wall is a 64 y.o. male with the following history as recorded in EpicCare:  Patient Active Problem List   Diagnosis Date Noted  . Osteoarthritis of right knee 06/15/2014  . Status post total right knee replacement 06/15/2014  . Sleep disorder 04/25/2014  . Inguinal hernia 12/21/2013  . Diabetes ([place]) 10/11/2013  . Routine general medical examination at a health care facility 10/10/2013  . Screening for prostate cancer 10/10/2013  . Gout 04/26/2008  . RECTAL BLEEDING 04/26/2008  . CHEST PAIN UNSPECIFIED 04/25/2007  . HYPERCHOLESTEROLEMIA 01/26/2007  . UNSPECIFIED CHRONIC ISCHEMIC HEART DISEASE 01/26/2007  . CERVICAL RADICULOPATHY, RIGHT 01/26/2007  . INSOMNIA 01/26/2007    Current Outpatient Medications  Medication Sig Dispense Refill  . colchicine 0.6 MG tablet Take two tablets as one dose followed one hour later by one tablet. No more than three tablets each day. 20 tablet 0  . indomethacin (INDOCIN) 50 MG capsule Take 1 capsule (50 mg total) by mouth 3 (three) times daily with meals. As needed for gout flare 60 capsule 0  . metFORMIN (GLUCOPHAGE) 500 MG tablet TAKE 1 TABLET (500 MG TOTAL) BY MOUTH DAILY AFTER SUPPER. 90 tablet 0  . omeprazole (PRILOSEC) 20 MG capsule Take 1 capsule (20 mg total) by mouth 2 (two) times daily before a meal. 180 capsule 3  . probenecid (BENEMID) 500 MG tablet TAKE 1/2 TABLET BY MOUTH 2 TIMES A DAY FOR 1 WEEK, THEN INCREASE TO 1 TABLET BY MOUTH 2 TIMES A DAY 60 tablet 3  . rosuvastatin (CRESTOR) 10 MG tablet TAKE 1 TABLET BY MOUTH EVERY DAY 30 tablet 3  . tamsulosin (FLOMAX) 0.4 MG CAPS capsule TAKE 1 CAPSULE BY MOUTH EVERY DAY 30 capsule 3  . traMADol (ULTRAM) 50 MG tablet Take 1 tablet (50 mg total) by mouth every 6 (six) hours as needed. 30 tablet 0  . fenofibrate (TRICOR) 145 MG tablet Take 1 tablet (145 mg total) by mouth daily. (Patient not taking: Reported on 02/21/2019) 90 tablet 1   No current facility-administered medications for this  visit.    Allergies: Amoxicillin, Aspirin, Penicillins, and Allopurinol  Past Medical History:  Diagnosis Date  . Arthritis   . Asthma    hx of in childhood  . Diabetes mellitus without complication ([place])    no tx  . GERD (gastroesophageal reflux disease)   . Gout   . H/O urinary frequency    as noted in H&P per Dr Loanne Drilling 04/25/2014     Past Surgical History:  Procedure Laterality Date  . EYE SURGERY     laser surgery bilat   . JOINT REPLACEMENT    . TOTAL KNEE ARTHROPLASTY Right 06/15/2014   Procedure: RIGHT TOTAL KNEE ARTHROPLASTY;  Surgeon: Mcarthur Rossetti, MD;  Location: [facility];  Service: Orthopedics;  Laterality: Right;  . UPPER GI ENDOSCOPY      Family History  Problem Relation Age of Onset  . Healthy Mother   . Healthy Father   . Colon cancer Neg Hx     Social History   Tobacco Use  . Smoking status: Never Smoker  . Smokeless tobacco: Never Used  Substance Use Topics  . Alcohol use: Yes    Comment: occasional; 1-2 drinks per week per pt    Subjective:  Patient presents for 4 month follow-up chronic care needs;  1) Needs to get his second shingles vaccine today; no other vaccines within the past few months;  2) Gout- feels like he is doing much  better overall; may have a flare every 2 months now; 3) Type 2 Diabetes- tolerating Metformin well; 4) Hyperlipidemia- could not tolerate the Tricor that was added last fall; only taking Crestor; felt that Tricor was causing increased cramps;    Objective:  Vitals:   02/21/19 1105  BP: 128/76  Pulse: 76  Temp: 98.2 F (36.8 C)  TempSrc: Oral  SpO2: 96%  Weight: 275 lb 6.4 oz (124.9 kg)  Height: _0  (1.727 m)    General: Well developed, well nourished, in no acute distress  Skin : Warm and dry.  Head: Normocephalic and atraumatic  Lungs: Respirations unlabored; clear to auscultation bilaterally without wheeze, rales, rhonchi  CVS exam: normal rate and regular rhythm.  Neurologic: Alert and oriented;  speech intact; face symmetrical; moves all extremities well; CNII-XII intact without focal deficit   Assessment:  1. Type 2 diabetes mellitus without complication, without long-term current use of insulin ([place])   2. Chronic gout without tophus, unspecified cause, unspecified site   3. Need for shingles vaccine   4. Mixed hyperlipidemia     Plan:  1. Check Hgba1c; continue Metformin; follow-up in 4-6 months; 2. Check uric acid; unable to take Allopurinol; 3. Vaccine given; 4. Check lipid panel; will most likely plan to increase Crestor to 20 mg daily; patient could not tolerate Tricor;   He knows he needs to see his orthopedist and get colonoscopy scheduled; he will make these appointments.   This visit occurred during the SARS-CoV-2 public health emergency.  Safety protocols were in place, including screening questions prior to the visit, additional usage of staff PPE, and extensive cleaning of exam room while observing appropriate contact time as indicated for disinfecting solutions.     No follow-ups on file.  Orders Placed This Encounter  Procedures  . Varicella-zoster vaccine IM (Shingrix)  . Uric acid  . Lipid panel  . HgB A1c  . Comp Met (CMET)    Requested Prescriptions    No prescriptions requested or ordered in this encounter

## 2019-02-22 ENCOUNTER — Other Ambulatory Visit: Payer: Self-pay | Admitting: Family

## 2019-02-22 DIAGNOSIS — R7989 Other specified abnormal findings of blood chemistry: Secondary | ICD-10-CM

## 2019-02-24 ENCOUNTER — Other Ambulatory Visit (INDEPENDENT_AMBULATORY_CARE_PROVIDER_SITE_OTHER): Payer: Commercial Managed Care - PPO

## 2019-02-24 ENCOUNTER — Other Ambulatory Visit: Payer: Self-pay

## 2019-02-24 DIAGNOSIS — R7989 Other specified abnormal findings of blood chemistry: Secondary | ICD-10-CM

## 2019-02-24 LAB — COMPREHENSIVE METABOLIC PANEL
ALT: 24 U/L (ref 0–53)
AST: 20 U/L (ref 0–37)
Albumin: 4.2 g/dL (ref 3.5–5.2)
Alkaline Phosphatase: 63 U/L (ref 39–117)
BUN: 19 mg/dL (ref 6–23)
CO2: 24 mEq/L (ref 19–32)
Calcium: 9.6 mg/dL (ref 8.4–10.5)
Chloride: 106 mEq/L (ref 96–112)
Creatinine, Ser: 1.18 mg/dL (ref 0.40–1.50)
GFR: 75.75 mL/min (ref >60.00)
Glucose, Bld: 106 mg/dL — ABNORMAL HIGH (ref 70–99)
Potassium: 3.9 mEq/L (ref 3.5–5.1)
Sodium: 138 mEq/L (ref 135–145)
Total Bilirubin: 0.2 mg/dL (ref 0.2–1.2)
Total Protein: 7.5 g/dL (ref 6.0–8.3)

## 2019-02-28 ENCOUNTER — Encounter: Payer: Self-pay | Admitting: Orthopaedic Surgery

## 2019-02-28 ENCOUNTER — Other Ambulatory Visit: Payer: Self-pay

## 2019-02-28 ENCOUNTER — Ambulatory Visit: Payer: Commercial Managed Care - PPO | Admitting: Orthopaedic Surgery

## 2019-02-28 DIAGNOSIS — M25562 Pain in left knee: Secondary | ICD-10-CM

## 2019-02-28 DIAGNOSIS — M1712 Unilateral primary osteoarthritis, left knee: Secondary | ICD-10-CM

## 2019-02-28 NOTE — Progress Notes (Signed)
The patient comes in today to go over an MRI of his left knee.  He has a remote history of a right total knee that we did a few years ago.  His left knee has been hurting with some locking catching.  His plain films showed still a decent joint space remaining so we sent him for a MRI of the left knee after failure conservative treatment.  He has had an aspiration and steroid injection in his left knee.  He has had a reaccumulation of some of that fluid.  He is someone with a BMI of 42.  He has done well with his right total knee arthroplasty.  On exam his left knee does hurt on the medial joint line and there is some varus malalignment.  There is patellofemoral crepitation as well.  The MRI is reviewed with him.  There is only a small radial tear of the meniscus on the medial side but more important is areas of full-thickness cartilage loss over a wide area of the medial femoral condyle.  There is also significant cartilage loss of the patellofemoral joint.  Given the low arthritis in his knee our neck step would be considering hyaluronic acid to treat the pain from his arthritis given the failure conservative treatment including steroid shot.  He is to work on quad strengthening exercises and weight loss.  We will see him in 3 weeks to hopefully place hyaluronic acid into the left knee.  All questions concerns were answered and addressed.

## 2019-03-01 ENCOUNTER — Telehealth: Payer: Self-pay

## 2019-03-01 NOTE — Telephone Encounter (Signed)
Submitted for VOB for synvisc one- left knee 

## 2019-03-01 NOTE — Telephone Encounter (Signed)
Left knee gel injection ?

## 2019-03-03 ENCOUNTER — Telehealth: Payer: Self-pay

## 2019-03-03 NOTE — Telephone Encounter (Signed)
Approved for Synvisc one-Left knee Dr. Margarito Liner and Bill $50 copay No OOP No prior auth required

## 2019-03-21 ENCOUNTER — Other Ambulatory Visit: Payer: Self-pay

## 2019-03-21 ENCOUNTER — Encounter: Payer: Self-pay | Admitting: Orthopaedic Surgery

## 2019-03-21 ENCOUNTER — Ambulatory Visit: Payer: Commercial Managed Care - PPO | Admitting: Orthopaedic Surgery

## 2019-03-21 DIAGNOSIS — M7662 Achilles tendinitis, left leg: Secondary | ICD-10-CM

## 2019-03-21 DIAGNOSIS — M1712 Unilateral primary osteoarthritis, left knee: Secondary | ICD-10-CM | POA: Diagnosis not present

## 2019-03-21 MED ORDER — HYLAN G-F 20 48 MG/6ML IX SOSY
48.0000 mg | PREFILLED_SYRINGE | INTRA_ARTICULAR | Status: AC | PRN
  Administered 2019-03-21: 48 mg via INTRA_ARTICULAR

## 2019-03-21 MED ORDER — LIDOCAINE HCL 1 % IJ SOLN
5.0000 mL | INTRAMUSCULAR | Status: AC | PRN
  Administered 2019-03-21: 5 mL

## 2019-03-21 NOTE — Progress Notes (Addendum)
Office Visit Note   Patient: David Wall           Date of Birth: April 30, 1955           MRN: [id number] Visit Date: 03/21/2019              Requested by: Marrian Salvage, [address] [place],  [place] [id number] PCP: Marrian Salvage, FNP   Assessment & Plan: Visit Diagnoses:  1. Unilateral primary osteoarthritis, left knee   2. Achilles tendinitis, left leg     Plan: Discussed shoewear and the need to avoid open back shoes.  He shown stretching exercises for his gastrocsoleus.  He will apply  Voltaren gel to the Achilles up to 4 times daily.  9/16 his heel lifts are placed in his shoes.  He will follow-up with [place] in 8 weeks to see what type of response he had to the Synvisc 1 injection.  He may require further treatment for the Achilles tendinitis conservative measures do not help these might include placing in the cam walker boot on 9/16 his heel lift and sending him to formal physical therapy.  Follow-Up Instructions: No follow-ups on file.   Orders:  Orders Placed This Encounter  Procedures  . Large Joint Inj   No orders of the defined types were placed in this encounter.     Procedures: Large Joint Inj on 03/21/2019 9:22 AM Indications: pain Details: 22 G 1.5 in needle, superolateral approach  Arthrogram: No  Medications: 5 mL lidocaine 1 %; 48 mg Hylan 48 MG/6ML Outcome: tolerated well, no immediate complications Procedure, treatment alternatives, risks and benefits explained, specific risks discussed. Consent was given by the patient. Immediately prior to procedure a time out was called to verify the correct patient, procedure, equipment, support staff and site/side marked as required. Patient was prepped and draped in the usual sterile fashion.       Clinical Data: No additional findings.   Subjective: Chief Complaint  Patient presents with  . Left Knee - Follow-up    HPI Mr. [place] comes in today for Synvisc 1 injection.  He has had  no new injury to the knee.  Again MRI of the knee showed small radial tear of the meniscus on the medial side but more importantly areas of full-thickness cartilage loss over the weight bearing area of the medial femoral condyle.  Also significant cartilage loss over the patellofemoral joint. He comes in today also complaining of left heel pain.  States he has had this on and off over the years but became worse last night.  Said no known injury.  He denies any open back shoe wear.  He has been recently diagnosed with gout and feels that the elbow that was sudden for was due to the gout he is on colchicine and indomethacin.  Review of Systems Please see HPI otherwise negative  Objective: Vital Signs: There were no vitals taken for this visit.  Physical Exam General: Well-developed well-nourished male in no acute distress Psych: Alert and oriented x3 Ortho Exam Left knee: No abnormal warmth, erythema ecchymosis or effusion. Left Achilles Thompson's test is negative.  He has tenderness at the Achilles insertion.  There is no abnormal warmth or erythema.  Left Achilles compared to the right Achilles there is definite thickening of the left Achilles compared to the right but no tenderness in the proximal portion of the Achilles on the left.  Specialty Comments:  No specialty comments available.  Imaging:  No results found.   PMFS History: Patient Active Problem List   Diagnosis Date Noted  . Unilateral primary osteoarthritis, left knee 02/28/2019  . Osteoarthritis of right knee 06/15/2014  . Status post total right knee replacement 06/15/2014  . Sleep disorder 04/25/2014  . Inguinal hernia 12/21/2013  . Diabetes ([place]) 10/11/2013  . Routine general medical examination at a health care facility 10/10/2013  . Screening for prostate cancer 10/10/2013  . Gout 04/26/2008  . RECTAL BLEEDING 04/26/2008  . CHEST PAIN UNSPECIFIED 04/25/2007  . HYPERCHOLESTEROLEMIA 01/26/2007  . UNSPECIFIED  CHRONIC ISCHEMIC HEART DISEASE 01/26/2007  . CERVICAL RADICULOPATHY, RIGHT 01/26/2007  . INSOMNIA 01/26/2007   Past Medical History:  Diagnosis Date  . Arthritis   . Asthma    hx of in childhood  . Diabetes mellitus without complication ([place])    no tx  . GERD (gastroesophageal reflux disease)   . Gout   . H/O urinary frequency    as noted in H&P per Dr Loanne Drilling 04/25/2014     Family History  Problem Relation Age of Onset  . Healthy Mother   . Healthy Father   . Colon cancer Neg Hx     Past Surgical History:  Procedure Laterality Date  . EYE SURGERY     laser surgery bilat   . JOINT REPLACEMENT    . TOTAL KNEE ARTHROPLASTY Right 06/15/2014   Procedure: RIGHT TOTAL KNEE ARTHROPLASTY;  Surgeon: Mcarthur Rossetti, MD;  Location: [facility];  Service: Orthopedics;  Laterality: Right;  . UPPER GI ENDOSCOPY     Social History   Occupational History  . Occupation: Cook  Tobacco Use  . Smoking status: Never Smoker  . Smokeless tobacco: Never Used  Substance and Sexual Activity  . Alcohol use: Yes    Comment: occasional; 1-2 drinks per week per pt  . Drug use: No  . Sexual activity: Not on file

## 2019-05-02 ENCOUNTER — Encounter: Payer: Self-pay | Admitting: Orthopaedic Surgery

## 2019-05-02 ENCOUNTER — Ambulatory Visit: Payer: Commercial Managed Care - PPO | Admitting: Orthopaedic Surgery

## 2019-05-02 ENCOUNTER — Other Ambulatory Visit: Payer: Self-pay

## 2019-05-02 DIAGNOSIS — M1712 Unilateral primary osteoarthritis, left knee: Secondary | ICD-10-CM

## 2019-05-02 NOTE — Progress Notes (Signed)
Office Visit Note   Patient: David Wall           Date of Birth: [birth year]           MRN: [id number] Visit Date: 05/02/2019              Requested by: Marrian Salvage, [address] [place],  [place] [address] PCP: Marrian Salvage, FNP   Assessment & Plan: Visit Diagnoses:  1. Unilateral primary osteoarthritis, left knee     Plan: Discussed with patient the use of diclofenac gel on the knee.  Also he can take a knee brace up to see if this helps.  Otherwise recommend weight loss quad strengthening.  Discussed knee friendly exercises with him at length.  Outside of that recommend knee replacement when and if his knee pain becomes to the point that is affecting his activities of daily living.  Questions were encouraged and answered.  Follow-up on an as-needed basis.  Follow-Up Instructions: Return if symptoms worsen or fail to improve.   Orders:  No orders of the defined types were placed in this encounter.  No orders of the defined types were placed in this encounter.     Procedures: No procedures performed   Clinical Data: No additional findings.   Subjective: Chief Complaint  Patient presents with  . Left Knee - Follow-up    HPI  David Wall returns today follow-up status post Synvisc 1 injection on 03/21/2019.  He states he is unsure if the knee injection really helped.  He is not really having much pain when ambulating.  He does have some pain medial aspect the knee particularly at rest.  He does have trouble going up and down stairs.  States knee still pops.  Points the medial aspect of the knee as the area of burning the knee.  Get an MRI of his left knee showed a small radial tear of the meniscus medial compartment.  But showed areas of full-thickness cartilage loss over the weightbearing aspect of the medial femoral condyle and also significant cartilage loss the patellofemoral joint.  He has undergone a right total knee arthroplasty back in May  2016 and is done well with the right knee replacement. Left Achilles tendinitis resolved with the use of Voltaren gel.  He states he has no pain in the Achilles region at this point.  Review of Systems Negative for fevers chills shortness of breath chest pain.  Please see HPI otherwise negative or noncontributory.  Objective: Vital Signs: There were no vitals taken for this visit.  Physical Exam Constitutional:      Appearance: He is not ill-appearing or diaphoretic.  Neurological:     Mental Status: He is alert and oriented to person, place, and time.  Psychiatric:        Mood and Affect: Mood normal.     Ortho Exam Left knee good range of motion without significant pain.  No abnormal warmth erythema or effusion.  No instability valgus varus stressing.  Passive range of motion reveals significant crepitus the left knee.  Tenderness along medial joint line. Specialty Comments:  No specialty comments available.  Imaging: No results found.   PMFS History: Patient Active Problem List   Diagnosis Date Noted  . Unilateral primary osteoarthritis, left knee 02/28/2019  . Osteoarthritis of right knee 06/15/2014  . Status post total right knee replacement 06/15/2014  . Sleep disorder 04/25/2014  . Inguinal hernia 12/21/2013  . Diabetes ([place]) 10/11/2013  .  Routine general medical examination at a health care facility 10/10/2013  . Screening for prostate cancer 10/10/2013  . Gout 04/26/2008  . RECTAL BLEEDING 04/26/2008  . CHEST PAIN UNSPECIFIED 04/25/2007  . HYPERCHOLESTEROLEMIA 01/26/2007  . UNSPECIFIED CHRONIC ISCHEMIC HEART DISEASE 01/26/2007  . CERVICAL RADICULOPATHY, RIGHT 01/26/2007  . INSOMNIA 01/26/2007   Past Medical History:  Diagnosis Date  . Arthritis   . Asthma    hx of in childhood  . Diabetes mellitus without complication ([place])    no tx  . GERD (gastroesophageal reflux disease)   . Gout   . H/O urinary frequency    as noted in H&P per Dr Loanne Drilling 04/25/2014      Family History  Problem Relation Age of Onset  . Healthy Mother   . Healthy Father   . Colon cancer Neg Hx     Past Surgical History:  Procedure Laterality Date  . EYE SURGERY     laser surgery bilat   . JOINT REPLACEMENT    . TOTAL KNEE ARTHROPLASTY Right 06/15/2014   Procedure: RIGHT TOTAL KNEE ARTHROPLASTY;  Surgeon: Mcarthur Rossetti, MD;  Location: [facility];  Service: Orthopedics;  Laterality: Right;  . UPPER GI ENDOSCOPY     Social History   Occupational History  . Occupation: Cook  Tobacco Use  . Smoking status: Never Smoker  . Smokeless tobacco: Never Used  Substance and Sexual Activity  . Alcohol use: Yes    Comment: occasional; 1-2 drinks per week per pt  . Drug use: No  . Sexual activity: Not on file

## 2019-05-15 ENCOUNTER — Other Ambulatory Visit: Payer: Self-pay | Admitting: Family

## 2019-05-15 DIAGNOSIS — M1A9XX0 Chronic gout, unspecified, without tophus (tophi): Secondary | ICD-10-CM

## 2019-05-15 DIAGNOSIS — E119 Type 2 diabetes mellitus without complications: Secondary | ICD-10-CM

## 2019-05-15 NOTE — Progress Notes (Signed)
Spoke with patient- he will plan to get his labs checked at [facility] sometime in the next 1-2 weeks; orders are in place for him.

## 2019-05-23 ENCOUNTER — Other Ambulatory Visit: Payer: Self-pay | Admitting: Family

## 2019-05-25 ENCOUNTER — Other Ambulatory Visit: Payer: Self-pay | Admitting: Family

## 2019-05-29 ENCOUNTER — Other Ambulatory Visit (INDEPENDENT_AMBULATORY_CARE_PROVIDER_SITE_OTHER): Payer: Commercial Managed Care - PPO

## 2019-05-29 DIAGNOSIS — E119 Type 2 diabetes mellitus without complications: Secondary | ICD-10-CM

## 2019-05-29 DIAGNOSIS — M1A9XX0 Chronic gout, unspecified, without tophus (tophi): Secondary | ICD-10-CM | POA: Diagnosis not present

## 2019-05-29 LAB — COMPREHENSIVE METABOLIC PANEL
ALT: 25 U/L (ref 0–53)
AST: 24 U/L (ref 0–37)
Albumin: 4 g/dL (ref 3.5–5.2)
Alkaline Phosphatase: 59 U/L (ref 39–117)
BUN: 18 mg/dL (ref 6–23)
CO2: 22 mEq/L (ref 19–32)
Calcium: 9 mg/dL (ref 8.4–10.5)
Chloride: 105 mEq/L (ref 96–112)
Creatinine, Ser: 1 mg/dL (ref 0.40–1.50)
GFR: 91.62 mL/min (ref >60.00)
Glucose, Bld: 130 mg/dL — ABNORMAL HIGH (ref 70–99)
Potassium: 3.6 mEq/L (ref 3.5–5.1)
Sodium: 137 mEq/L (ref 135–145)
Total Bilirubin: 0.3 mg/dL (ref 0.2–1.2)
Total Protein: 6.9 g/dL (ref 6.0–8.3)

## 2019-05-29 LAB — URIC ACID: Uric Acid, Serum: 8.4 mg/dL — ABNORMAL HIGH (ref 4.0–7.8)

## 2019-05-29 LAB — HEMOGLOBIN A1C: Hgb A1c MFr Bld: 6.5 % (ref 4.6–6.5)

## 2019-06-06 ENCOUNTER — Encounter: Payer: Self-pay | Admitting: Family

## 2019-06-06 ENCOUNTER — Ambulatory Visit: Payer: Commercial Managed Care - PPO | Admitting: Family

## 2019-06-06 ENCOUNTER — Other Ambulatory Visit: Payer: Self-pay

## 2019-06-06 ENCOUNTER — Encounter: Payer: Self-pay | Admitting: Gastroenterology

## 2019-06-06 VITALS — BP 120/70 | HR 78 | Temp 98.0°F | Ht 68.0 in | Wt 267.8 lb

## 2019-06-06 DIAGNOSIS — G8929 Other chronic pain: Secondary | ICD-10-CM

## 2019-06-06 DIAGNOSIS — M25562 Pain in left knee: Secondary | ICD-10-CM | POA: Diagnosis not present

## 2019-06-06 DIAGNOSIS — L309 Dermatitis, unspecified: Secondary | ICD-10-CM

## 2019-06-06 DIAGNOSIS — D369 Benign neoplasm, unspecified site: Secondary | ICD-10-CM | POA: Diagnosis not present

## 2019-06-06 MED ORDER — NYSTATIN-TRIAMCINOLONE 100000-0.1 UNIT/GM-% EX CREA: 1.0000 "application " | TOPICAL_CREAM | Freq: Two times a day (BID) | CUTANEOUS | 0 refills | Status: DC

## 2019-06-06 MED ORDER — NAPROXEN 500 MG PO TABS
500.0000 mg | ORAL_TABLET | Freq: Two times a day (BID) | ORAL | 0 refills | Status: DC
Start: 2019-06-06 — End: 2019-06-28

## 2019-06-06 NOTE — Progress Notes (Signed)
David Wall is a 64 y.o. male with the following history as recorded in EpicCare:  Patient Active Problem List   Diagnosis Date Noted  . Unilateral primary osteoarthritis, left knee 02/28/2019  . Osteoarthritis of right knee 06/15/2014  . Status post total right knee replacement 06/15/2014  . Sleep disorder 04/25/2014  . Inguinal hernia 12/21/2013  . Diabetes ([place]) 10/11/2013  . Routine general medical examination at a health care facility 10/10/2013  . Screening for prostate cancer 10/10/2013  . Gout 04/26/2008  . RECTAL BLEEDING 04/26/2008  . CHEST PAIN UNSPECIFIED 04/25/2007  . HYPERCHOLESTEROLEMIA 01/26/2007  . UNSPECIFIED CHRONIC ISCHEMIC HEART DISEASE 01/26/2007  . CERVICAL RADICULOPATHY, RIGHT 01/26/2007  . INSOMNIA 01/26/2007    Current Outpatient Medications  Medication Sig Dispense Refill  . colchicine 0.6 MG tablet Take two tablets as one dose followed one hour later by one tablet. No more than three tablets each day. 20 tablet 0  . indomethacin (INDOCIN) 50 MG capsule Take 1 capsule (50 mg total) by mouth 3 (three) times daily with meals. As needed for gout flare 60 capsule 0  . metFORMIN (GLUCOPHAGE) 500 MG tablet TAKE 1 TABLET (500 MG TOTAL) BY MOUTH DAILY AFTER SUPPER. 90 tablet 0  . omeprazole (PRILOSEC) 20 MG capsule Take 1 capsule (20 mg total) by mouth 2 (two) times daily before a meal. 180 capsule 3  . probenecid (BENEMID) 500 MG tablet TAKE 1/2 TABLET BY MOUTH 2 TIMES A DAY FOR 1 WEEK, THEN INCREASE TO 1 TABLET BY MOUTH 2 TIMES A DAY 60 tablet 3  . rosuvastatin (CRESTOR) 10 MG tablet TAKE 1 TABLET BY MOUTH EVERY DAY 30 tablet 3  . tamsulosin (FLOMAX) 0.4 MG CAPS capsule TAKE 1 CAPSULE BY MOUTH EVERY DAY 30 capsule 3  . traMADol (ULTRAM) 50 MG tablet Take 1 tablet (50 mg total) by mouth every 6 (six) hours as needed. 30 tablet 0  . naproxen (NAPROSYN) 500 MG tablet Take 1 tablet (500 mg total) by mouth 2 (two) times daily with a meal. 60 tablet 0  .  nystatin-triamcinolone (MYCOLOG II) cream Apply 1 application topically 2 (two) times daily. 30 g 0   No current facility-administered medications for this visit.    Allergies: Amoxicillin, Aspirin, Penicillins, and Allopurinol  Past Medical History:  Diagnosis Date  . Arthritis   . Asthma    hx of in childhood  . Diabetes mellitus without complication ([place])    no tx  . GERD (gastroesophageal reflux disease)   . Gout   . H/O urinary frequency    as noted in H&P per Dr Loanne Drilling 04/25/2014     Past Surgical History:  Procedure Laterality Date  . EYE SURGERY     laser surgery bilat   . JOINT REPLACEMENT    . TOTAL KNEE ARTHROPLASTY Right 06/15/2014   Procedure: RIGHT TOTAL KNEE ARTHROPLASTY;  Surgeon: Mcarthur Rossetti, MD;  Location: [facility];  Service: Orthopedics;  Laterality: Right;  . UPPER GI ENDOSCOPY      Family History  Problem Relation Age of Onset  . Healthy Mother   . Healthy Father   . Colon cancer Neg Hx     Social History   Tobacco Use  . Smoking status: Never Smoker  . Smokeless tobacco: Never Used  Substance Use Topics  . Alcohol use: Yes    Comment: occasional; 1-2 drinks per week per pt    Subjective:  Complaining of "non healing" sores/ cracks in his skin on the  tips of his fingers on right thumb/ right first finger and left 3rd finger; has been applying topical Neosporin with limited benefit; no redness or pustular drainage; no known injury or trauma;  Would also like to get her colonoscopy updated- was due in December 2020; had opted to hold it thinking he was going to be having a knee replacement;    Objective:  Vitals:   06/06/19 1151  BP: 120/70  Pulse: 78  Temp: 98 F (36.7 C)  TempSrc: Oral  SpO2: 98%  Weight: 267 lb 12.8 oz (121.5 kg)  Height: 5\' 8"  (1.727 m)    General: Well developed, well nourished, in no acute distress  Skin : Warm and dry. Cracked areas noted on tips of fingertips; Head: Normocephalic and atraumatic  Lungs:  Respirations unlabored;  Neurologic: Alert and oriented; speech intact; face symmetrical; moves all extremities well; CNII-XII intact without focal deficit   Assessment:  1. Adenomatous polyp   2. Dermatitis   3. Chronic pain of left knee     Plan:  1. Refer back to GI; 2. ? Eczema or fungal type component; trial of Mycolog II; apply bid to affected area; 3. Rx for Naproxen 500 mg bid prn; he will let his orthopedist know when he is ready to schedule for replacement.  This visit occurred during the SARS-CoV-2 public health emergency.  Safety protocols were in place, including screening questions prior to the visit, additional usage of staff PPE, and extensive cleaning of exam room while observing appropriate contact time as indicated for disinfecting solutions.     Return in about 6 months (around 12/07/2019).  Orders Placed This Encounter  Procedures  . Ambulatory referral to Gastroenterology    Referral Priority:   Routine    Referral Type:   Consultation    Referral Reason:   Specialty Services Required    Referred to Provider:   Mauri Pole, MD    Number of Visits Requested:   1    Requested Prescriptions   Signed Prescriptions Disp Refills  . naproxen (NAPROSYN) 500 MG tablet 60 tablet 0    Sig: Take 1 tablet (500 mg total) by mouth 2 (two) times daily with a meal.  . nystatin-triamcinolone (MYCOLOG II) cream 30 g 0    Sig: Apply 1 application topically 2 (two) times daily.

## 2019-06-28 ENCOUNTER — Other Ambulatory Visit: Payer: Self-pay | Admitting: Family

## 2019-07-06 ENCOUNTER — Other Ambulatory Visit: Payer: Self-pay | Admitting: Family

## 2019-07-07 MED ORDER — TAMSULOSIN HCL 0.4 MG PO CAPS: 0.4000 mg | ORAL_CAPSULE | Freq: Every day | ORAL | 3 refills | Status: DC

## 2019-07-07 MED ORDER — PROBENECID 500 MG PO TABS: ORAL_TABLET | ORAL | 3 refills | Status: DC

## 2019-07-17 ENCOUNTER — Ambulatory Visit (AMBULATORY_SURGERY_CENTER): Payer: Self-pay | Admitting: *Deleted

## 2019-07-17 ENCOUNTER — Other Ambulatory Visit: Payer: Self-pay

## 2019-07-17 VITALS — Ht 68.0 in | Wt 275.0 lb

## 2019-07-17 DIAGNOSIS — Z86010 Personal history of colonic polyps: Secondary | ICD-10-CM

## 2019-07-17 MED ORDER — SUTAB 1479-225-188 MG PO TABS: 1.0000 | ORAL_TABLET | Freq: Once | ORAL | 0 refills | Status: AC

## 2019-07-17 NOTE — Progress Notes (Signed)

## 2019-07-28 ENCOUNTER — Encounter: Payer: Self-pay | Admitting: Gastroenterology

## 2019-08-04 ENCOUNTER — Encounter: Payer: Self-pay | Admitting: Gastroenterology

## 2019-08-04 ENCOUNTER — Ambulatory Visit (AMBULATORY_SURGERY_CENTER): Payer: Commercial Managed Care - PPO | Admitting: Gastroenterology

## 2019-08-04 ENCOUNTER — Other Ambulatory Visit: Payer: Self-pay

## 2019-08-04 VITALS — BP 102/56 | HR 58 | Temp 97.3°F | Resp 16 | Ht 68.0 in | Wt 275.0 lb

## 2019-08-04 DIAGNOSIS — Z86010 Personal history of colonic polyps: Secondary | ICD-10-CM | POA: Diagnosis not present

## 2019-08-04 DIAGNOSIS — D124 Benign neoplasm of descending colon: Secondary | ICD-10-CM | POA: Diagnosis not present

## 2019-08-04 DIAGNOSIS — D123 Benign neoplasm of transverse colon: Secondary | ICD-10-CM | POA: Diagnosis not present

## 2019-08-04 MED ORDER — SODIUM CHLORIDE 0.9 % IV SOLN: 500.0000 mL | Freq: Once | INTRAVENOUS | Status: DC

## 2019-08-04 NOTE — Progress Notes (Signed)
Called to room to assist during endoscopic procedure.  Patient ID and intended procedure confirmed with present staff. Received instructions for my participation in the procedure from the performing physician.  

## 2019-08-04 NOTE — Progress Notes (Signed)
Pt's states no medical or surgical changes since previsit or office visit.  VS WR  Pst on metformin-denies DM--states he is taking for kidney protection.

## 2019-08-04 NOTE — Op Note (Signed)
[facility] Patient Name: David Wall Procedure Date: 08/04/2019 8:40 AM MRN: [id number] Endoscopist: Mauri Pole , MD Age: 64 Referring MD:  Date of Birth: 1955-08-30 Gender: Male Account #: [id number] Procedure:                Colonoscopy Indications:              High risk colon cancer surveillance: Personal                            history of multiple (3 or more) adenomas Medicines:                Monitored Anesthesia Care Procedure:                Pre-Anesthesia Assessment:                           - Prior to the procedure, a History and Physical                            was performed, and patient medications and                            allergies were reviewed. The patient's tolerance of                            previous anesthesia was also reviewed. The risks                            and benefits of the procedure and the sedation                            options and risks were discussed with the patient.                            All questions were answered, and informed consent                            was obtained. Prior Anticoagulants: The patient has                            taken no previous anticoagulant or antiplatelet                            agents. ASA Grade Assessment: III - A patient with                            severe systemic disease. After reviewing the risks                            and benefits, the patient was deemed in                            satisfactory condition to undergo the procedure.  After obtaining informed consent, the colonoscope                            was passed under direct vision. Throughout the                            procedure, the patient's blood pressure, pulse, and                            oxygen saturations were monitored continuously. The                            Colonoscope was introduced through the anus and                            advanced to the the  cecum, identified by                            appendiceal orifice and ileocecal valve. The                            colonoscopy was performed without difficulty. The                            patient tolerated the procedure well. The quality                            of the bowel preparation was excellent. The                            ileocecal valve, appendiceal orifice, and rectum                            were photographed. Scope In: 8:50:51 AM Scope Out: 9:05:49 AM Scope Withdrawal Time: 0 hours 11 minutes 56 seconds  Total Procedure Duration: 0 hours 14 minutes 58 seconds  Findings:                 The perianal and digital rectal examinations were                            normal.                           A 7 mm polyp was found in the transverse colon. The                            polyp was sessile. The polyp was removed with a                            cold snare. Resection and retrieval were complete.                           Two sessile polyps were found in the descending  colon and transverse colon. The polyps were 1 to 2                            mm in size. These polyps were removed with a cold                            biopsy forceps. Resection and retrieval were                            complete.                           Non-bleeding internal hemorrhoids were found during                            retroflexion. The hemorrhoids were small. Complications:            No immediate complications. Estimated Blood Loss:     Estimated blood loss was minimal. Impression:               - One 7 mm polyp in the transverse colon, removed                            with a cold snare. Resected and retrieved.                           - Two 1 to 2 mm polyps in the descending colon and                            in the transverse colon, removed with a cold biopsy                            forceps. Resected and retrieved.                            - Non-bleeding internal hemorrhoids. Recommendation:           - Patient has a contact number available for                            emergencies. The signs and symptoms of potential                            delayed complications were discussed with the                            patient. Return to normal activities tomorrow.                            Written discharge instructions were provided to the                            patient.                           -  Resume previous diet.                           - Continue present medications.                           - Await pathology results.                           - Repeat colonoscopy in 3 - 5 years for                            surveillance based on pathology results. Mauri Pole, MD 08/04/2019 9:10:36 AM This report has been signed electronically.

## 2019-08-04 NOTE — Patient Instructions (Signed)
Impression/Recomomendations:  Polyp handout given to patient. Hemorrhoid handout given to patient.  Resume previous diet. Continue present medications. Await pathology results.  Repeat colonoscopy in 3-5 years for surveillance.  Date to be determined based on pathology results.  YOU HAD AN ENDOSCOPIC PROCEDURE TODAY AT [facility]:   Refer to the procedure report that was given to you for any specific questions about what was found during the examination.  If the procedure report does not answer your questions, please call your gastroenterologist to clarify.  If you requested that your care partner not be given the details of your procedure findings, then the procedure report has been included in a sealed envelope for you to review at your convenience later.  YOU SHOULD EXPECT: Some feelings of bloating in the abdomen. Passage of more gas than usual.  Walking can help get rid of the air that was put into your GI tract during the procedure and reduce the bloating. If you had a lower endoscopy (such as a colonoscopy or flexible sigmoidoscopy) you may notice spotting of blood in your stool or on the toilet paper. If you underwent a bowel prep for your procedure, you may not have a normal bowel movement for a few days.  Please Note:  You might notice some irritation and congestion in your nose or some drainage.  This is from the oxygen used during your procedure.  There is no need for concern and it should clear up in a day or so.  SYMPTOMS TO REPORT IMMEDIATELY:   Following lower endoscopy (colonoscopy or flexible sigmoidoscopy):  Excessive amounts of blood in the stool  Significant tenderness or worsening of abdominal pains  Swelling of the abdomen that is new, acute  Fever of 100F or higher  For urgent or emergent issues, a gastroenterologist can be reached at any hour by calling [phone]. Do not use MyChart messaging for urgent concerns.    DIET:  We do recommend  a small meal at first, but then you may proceed to your regular diet.  Drink plenty of fluids but you should avoid alcoholic beverages for 24 hours.  ACTIVITY:  You should plan to take it easy for the rest of today and you should NOT DRIVE or use heavy machinery until tomorrow (because of the sedation medicines used during the test).    FOLLOW UP: Our staff will call the number listed on your records 48-72 hours following your procedure to check on you and address any questions or concerns that you may have regarding the information given to you following your procedure. If we do not reach you, we will leave a message.  We will attempt to reach you two times.  During this call, we will ask if you have developed any symptoms of COVID 19. If you develop any symptoms (ie: fever, flu-like symptoms, shortness of breath, cough etc.) before then, please call [phone].  If you test positive for Covid 19 in the 2 weeks post procedure, please call and report this information to [place].    If any biopsies were taken you will be contacted by phone or by letter within the next 1-3 weeks.  Please call us at [phone] if you have not heard about the biopsies in 3 weeks.    SIGNATURES/CONFIDENTIALITY: You and/or your care partner have signed paperwork which will be entered into your electronic medical record.  These signatures attest to the fact that that the information above on your After Visit Summary has  been reviewed and is understood.  Full responsibility of the confidentiality of this discharge information lies with you and/or your care-partner.

## 2019-08-04 NOTE — Progress Notes (Signed)
PT taken to PACU. Monitors in place. VSS. Report given to RN. 

## 2019-08-08 ENCOUNTER — Telehealth: Payer: Self-pay | Admitting: *Deleted

## 2019-08-08 NOTE — Telephone Encounter (Signed)
°  Follow up Call-  Call back number 08/04/2019  Post procedure Call Back phone  # [phone]  Permission to leave phone message Yes  Some recent data might be hidden     Patient questions:  Do you have a fever, pain , or abdominal swelling? No. Pain Score  0 *  Have you tolerated food without any problems? Yes.    Have you been able to return to your normal activities? Yes.    Do you have any questions about your discharge instructions: Diet   No. Medications  No. Follow up visit  No.  Do you have questions or concerns about your Care? No.  Actions: * If pain score is 4 or above: No action needed, pain <4.  1. Have you developed a fever since your procedure? no  2.   Have you had an respiratory symptoms (SOB or cough) since your procedure? no  3.   Have you tested positive for COVID 19 since your procedure no  4.   Have you had any family members/close contacts diagnosed with the COVID 19 since your procedure?  no   If yes to any of these questions please route to Joylene John, RN and Erenest Rasher, RN

## 2019-08-11 ENCOUNTER — Other Ambulatory Visit: Payer: Self-pay | Admitting: Family

## 2019-08-11 MED ORDER — ROSUVASTATIN CALCIUM 10 MG PO TABS: 10.0000 mg | ORAL_TABLET | Freq: Every day | ORAL | 3 refills | Status: DC

## 2019-08-15 ENCOUNTER — Encounter: Payer: Self-pay | Admitting: Gastroenterology

## 2019-11-26 ENCOUNTER — Other Ambulatory Visit: Payer: Self-pay | Admitting: Family

## 2019-11-27 ENCOUNTER — Encounter: Payer: Self-pay | Admitting: Family

## 2019-11-27 ENCOUNTER — Other Ambulatory Visit: Payer: Self-pay

## 2019-11-27 ENCOUNTER — Ambulatory Visit: Payer: Commercial Managed Care - PPO | Admitting: Family

## 2019-11-27 ENCOUNTER — Other Ambulatory Visit: Payer: Self-pay | Admitting: Family

## 2019-11-27 VITALS — BP 132/80 | HR 82 | Temp 98.0°F | Ht 68.0 in | Wt 275.0 lb

## 2019-11-27 DIAGNOSIS — R5383 Other fatigue: Secondary | ICD-10-CM | POA: Diagnosis not present

## 2019-11-27 DIAGNOSIS — Z23 Encounter for immunization: Secondary | ICD-10-CM

## 2019-11-27 DIAGNOSIS — M1A9XX0 Chronic gout, unspecified, without tophus (tophi): Secondary | ICD-10-CM

## 2019-11-27 DIAGNOSIS — E119 Type 2 diabetes mellitus without complications: Secondary | ICD-10-CM

## 2019-11-27 DIAGNOSIS — R0683 Snoring: Secondary | ICD-10-CM

## 2019-11-27 DIAGNOSIS — N401 Enlarged prostate with lower urinary tract symptoms: Secondary | ICD-10-CM

## 2019-11-27 DIAGNOSIS — Z125 Encounter for screening for malignant neoplasm of prostate: Secondary | ICD-10-CM

## 2019-11-27 DIAGNOSIS — R3914 Feeling of incomplete bladder emptying: Secondary | ICD-10-CM

## 2019-11-27 LAB — LDL CHOLESTEROL, DIRECT: Direct LDL: 64 mg/dL

## 2019-11-27 LAB — CBC WITH DIFFERENTIAL/PLATELET
Basophils Absolute: 0.1 10*3/uL (ref 0.0–0.1)
Basophils Relative: 0.6 % (ref 0.0–3.0)
Eosinophils Absolute: 0.3 10*3/uL (ref 0.0–0.7)
Eosinophils Relative: 3.2 % (ref 0.0–5.0)
HCT: 38.1 % — ABNORMAL LOW (ref 39.0–52.0)
Hemoglobin: 12.8 g/dL — ABNORMAL LOW (ref 13.0–17.0)
Lymphocytes Relative: 20.3 % (ref 12.0–46.0)
Lymphs Abs: 1.7 10*3/uL (ref 0.7–4.0)
MCHC: 33.5 g/dL (ref 30.0–36.0)
MCV: 89 fl (ref 78.0–100.0)
Monocytes Absolute: 0.5 10*3/uL (ref 0.1–1.0)
Monocytes Relative: 5.5 % (ref 3.0–12.0)
Neutro Abs: 5.9 10*3/uL (ref 1.4–7.7)
Neutrophils Relative %: 70.4 % (ref 43.0–77.0)
Platelets: 271 10*3/uL (ref 150.0–400.0)
RBC: 4.28 Mil/uL (ref 4.22–5.81)
RDW: 13.1 % (ref 11.5–15.5)
WBC: 8.3 10*3/uL (ref 4.0–10.5)

## 2019-11-27 LAB — COMPREHENSIVE METABOLIC PANEL
ALT: 24 U/L (ref 0–53)
AST: 27 U/L (ref 0–37)
Albumin: 4.3 g/dL (ref 3.5–5.2)
Alkaline Phosphatase: 61 U/L (ref 39–117)
BUN: 14 mg/dL (ref 6–23)
CO2: 24 mEq/L (ref 19–32)
Calcium: 9.2 mg/dL (ref 8.4–10.5)
Chloride: 102 mEq/L (ref 96–112)
Creatinine, Ser: 0.98 mg/dL (ref 0.40–1.50)
GFR: 82.66 mL/min (ref >60.00)
Glucose, Bld: 120 mg/dL — ABNORMAL HIGH (ref 70–99)
Potassium: 3.7 mEq/L (ref 3.5–5.1)
Sodium: 138 mEq/L (ref 135–145)
Total Bilirubin: 0.5 mg/dL (ref 0.2–1.2)
Total Protein: 6.5 g/dL (ref 6.0–8.3)

## 2019-11-27 LAB — LIPID PANEL
Cholesterol: 121 mg/dL (ref 0–200)
HDL: 25.1 mg/dL — ABNORMAL LOW (ref >39.00)
NonHDL: 95.48
Total CHOL/HDL Ratio: 5
Triglycerides: 297 mg/dL — ABNORMAL HIGH (ref 0.0–149.0)
VLDL: 59.4 mg/dL — ABNORMAL HIGH (ref 0.0–40.0)

## 2019-11-27 LAB — HEMOGLOBIN A1C: Hgb A1c MFr Bld: 6.8 % — ABNORMAL HIGH (ref 4.6–6.5)

## 2019-11-27 LAB — TSH: TSH: 2.68 u[IU]/mL (ref 0.35–4.50)

## 2019-11-27 LAB — MICROALBUMIN / CREATININE URINE RATIO
Creatinine,U: 137 mg/dL
Microalb Creat Ratio: 0.6 mg/g (ref 0.0–30.0)
Microalb, Ur: 0.8 mg/dL (ref 0.0–1.9)

## 2019-11-27 LAB — PSA: PSA: 0.96 ng/mL (ref 0.10–4.00)

## 2019-11-27 LAB — URIC ACID: Uric Acid, Serum: 8.4 mg/dL — ABNORMAL HIGH (ref 4.0–7.8)

## 2019-11-27 MED ORDER — NAPROXEN 500 MG PO TABS: 500.0000 mg | ORAL_TABLET | Freq: Two times a day (BID) | ORAL | 2 refills | Status: DC

## 2019-11-27 MED ORDER — TRAZODONE HCL 50 MG PO TABS: 50.0000 mg | ORAL_TABLET | Freq: Every evening | ORAL | 3 refills | Status: DC | PRN

## 2019-11-27 MED ORDER — SILDENAFIL CITRATE 100 MG PO TABS
50.0000 mg | ORAL_TABLET | Freq: Every day | ORAL | 1 refills | Status: DC | PRN
Start: 2019-11-27 — End: 2020-01-03

## 2019-11-27 MED ORDER — HYDROCORT-PRAMOXINE (PERIANAL) 1-1 % EX FOAM: 1.0000 | Freq: Three times a day (TID) | CUTANEOUS | 1 refills | Status: DC

## 2019-11-27 MED ORDER — COLCHICINE 0.6 MG PO TABS
ORAL_TABLET | ORAL | 0 refills | Status: DC
Start: 2019-11-27 — End: 2020-10-29

## 2019-11-27 NOTE — Patient Instructions (Signed)

## 2019-11-27 NOTE — Progress Notes (Signed)
David Wall is a 64 y.o. male with the following history as recorded in EpicCare:  Patient Active Problem List   Diagnosis Date Noted   Unilateral primary osteoarthritis, left knee 02/28/2019   Osteoarthritis of right knee 06/15/2014   Status post total right knee replacement 06/15/2014   Sleep disorder 04/25/2014   Inguinal hernia 12/21/2013   Diabetes ([place]) 10/11/2013   Routine general medical examination at a health care facility 10/10/2013   Screening for prostate cancer 10/10/2013   Gout 04/26/2008   RECTAL BLEEDING 04/26/2008   CHEST PAIN UNSPECIFIED 04/25/2007   HYPERCHOLESTEROLEMIA 01/26/2007   UNSPECIFIED CHRONIC ISCHEMIC HEART DISEASE 01/26/2007   CERVICAL RADICULOPATHY, RIGHT 01/26/2007   INSOMNIA 01/26/2007    Current Outpatient Medications  Medication Sig Dispense Refill   colchicine 0.6 MG tablet Take two tablets as one dose for gout flare as needed followed one hour later by one tablet. No more than three tablets each day. 20 tablet 0   metFORMIN (GLUCOPHAGE) 500 MG tablet TAKE 1 TABLET (500 MG TOTAL) BY MOUTH DAILY AFTER SUPPER. 90 tablet 0   naproxen (NAPROSYN) 500 MG tablet Take 1 tablet (500 mg total) by mouth 2 (two) times daily with a meal. 60 tablet 2   omeprazole (PRILOSEC) 20 MG capsule Take 1 capsule (20 mg total) by mouth 2 (two) times daily before a meal. 180 capsule 3   probenecid (BENEMID) 500 MG tablet TAKE 1/2 TABLET BY MOUTH 2 TIMES A DAY FOR 1 WEEK, THEN INCREASE TO 1 TABLET BY MOUTH 2 TIMES A DAY 60 tablet 3   hydrocortisone-pramoxine (PROCTOFOAM-HC) rectal foam Place 1 applicator rectally 3 (three) times daily. 10 g 1   nystatin-triamcinolone (MYCOLOG II) cream Apply 1 application topically 2 (two) times daily. (Patient not taking: Reported on 11/27/2019) 30 g 0   rosuvastatin (CRESTOR) 10 MG tablet TAKE 1 TABLET BY MOUTH EVERY DAY 90 tablet 1   sildenafil (VIAGRA) 100 MG tablet Take 0.5-1 tablets (50-100 mg total) by mouth  daily as needed for erectile dysfunction. 10 tablet 1   tamsulosin (FLOMAX) 0.4 MG CAPS capsule TAKE 1 CAPSULE BY MOUTH EVERY DAY 90 capsule 1   traZODone (DESYREL) 50 MG tablet Take 1 tablet (50 mg total) by mouth at bedtime as needed for sleep. May increase to 2 tablets as night as needed 60 tablet 3   No current facility-administered medications for this visit.    Allergies: Amoxicillin, Aspirin, Penicillins, and Allopurinol  Past Medical History:  Diagnosis Date   Arthritis    Asthma    hx of in childhood   Diabetes mellitus without complication (HCC)    no tx   GERD (gastroesophageal reflux disease)    Gout    H/O urinary frequency    as noted in H&P per Dr Loanne Drilling 04/25/2014     Past Surgical History:  Procedure Laterality Date   COLONOSCOPY     EYE SURGERY     laser surgery bilat    JOINT REPLACEMENT     TOTAL KNEE ARTHROPLASTY Right 06/15/2014   Procedure: RIGHT TOTAL KNEE ARTHROPLASTY;  Surgeon: Mcarthur Rossetti, MD;  Location: [facility];  Service: Orthopedics;  Laterality: Right;   UPPER GI ENDOSCOPY      Family History  Problem Relation Age of Onset   Healthy Mother    Healthy Father    Colon cancer Neg Hx    Esophageal cancer Neg Hx    Stomach cancer Neg Hx    Rectal cancer Neg Hx  Social History   Tobacco Use   Smoking status: Never Smoker   Smokeless tobacco: Never Used  Substance Use Topics   Alcohol use: Yes    Comment: occasional; 1-2 drinks per week per pt    Subjective:  6 month follow up on chronic care needs- needs to get labs updated for uric acid, Type 2 Diabetes, hyperlipidemia; Has been having increased fatigue recently- works 3rd shift and admits not getting good sleep; does snore; no diagnosis of OSA;   Objective:  Vitals:   11/27/19 1015  BP: 132/80  Pulse: 82  Temp: 98 F (36.7 C)  TempSrc: Oral  SpO2: 96%  Weight: 275 lb (124.7 kg)  Height: _0  (1.727 m)    General: Well developed, well nourished,  in no acute distress  Head: Normocephalic and atraumatic  Eyes: Sclera and conjunctiva clear; pupils round and reactive to light; extraocular movements intact  Ears: External normal; canals clear; tympanic membranes normal  Oropharynx: Pink, supple. No suspicious lesions  Neck: Supple without thyromegaly, adenopathy  Lungs: Respirations unlabored; clear to auscultation bilaterally without wheeze, rales, rhonchi  CVS exam: normal rate and regular rhythm.  Musculoskeletal: No deformities; no active joint inflammation  Extremities: No edema, cyanosis, clubbing  Vessels: Symmetric bilaterally  Neurologic: Alert and oriented; speech intact; face symmetrical; moves all extremities well; CNII-XII intact without focal deficit   Assessment:  1. Chronic gout without tophus, unspecified cause, unspecified site   2. Type 2 diabetes mellitus without complication, without long-term current use of insulin ([place])   3. Prostate cancer screening   4. Other fatigue   5. Snoring   6. Benign prostatic hyperplasia with incomplete bladder emptying   7. Needs flu shot     Plan:   1. Check uric acid level today; 2. Check Hgba1c, urine microalbumin today; 3. Check PSA; 4. Check TSH; suspect sleep patterns are contributing; trial of Trazodone- use as directed; 5. Refer for sleep study; 6. Refer to urology; 7. Flu shot given;   Time spent 30+ minutes;  This visit occurred during the SARS-CoV-2 public health emergency.  Safety protocols were in place, including screening questions prior to the visit, additional usage of staff PPE, and extensive cleaning of exam room while observing appropriate contact time as indicated for disinfecting solutions.     No follow-ups on file.  Orders Placed This Encounter  Procedures   Flu Vaccine QUAD 36+ mos IM   CBC with Differential/Platelet    Standing Status:   Future    Number of Occurrences:   1    Standing Expiration Date:   11/26/2020   Comp Met (CMET)     Standing Status:   Future    Number of Occurrences:   1    Standing Expiration Date:   11/26/2020   Lipid panel    Standing Status:   Future    Number of Occurrences:   1    Standing Expiration Date:   11/26/2020   Uric acid    Standing Status:   Future    Number of Occurrences:   1    Standing Expiration Date:   11/26/2020   Hemoglobin A1c    Standing Status:   Future    Number of Occurrences:   1    Standing Expiration Date:   11/26/2020   PSA    Standing Status:   Future    Number of Occurrences:   1    Standing Expiration Date:   11/26/2020  Urine Microalbumin w/creat. ratio    Standing Status:   Future    Number of Occurrences:   1    Standing Expiration Date:   11/26/2020   TSH    Standing Status:   Future    Number of Occurrences:   1    Standing Expiration Date:   11/26/2020   Ambulatory referral to Neurology    Referral Priority:   Routine    Referral Type:   Consultation    Referral Reason:   Specialty Services Required    Requested Specialty:   Neurology    Number of Visits Requested:   1   Ambulatory referral to Urology    Referral Priority:   Routine    Referral Type:   Consultation    Referral Reason:   Specialty Services Required    Requested Specialty:   Urology    Number of Visits Requested:   1    Requested Prescriptions   Signed Prescriptions Disp Refills   traZODone (DESYREL) 50 MG tablet 60 tablet 3    Sig: Take 1 tablet (50 mg total) by mouth at bedtime as needed for sleep. May increase to 2 tablets as night as needed   hydrocortisone-pramoxine (PROCTOFOAM-HC) rectal foam 10 g 1    Sig: Place 1 applicator rectally 3 (three) times daily.   sildenafil (VIAGRA) 100 MG tablet 10 tablet 1    Sig: Take 0.5-1 tablets (50-100 mg total) by mouth daily as needed for erectile dysfunction.   colchicine 0.6 MG tablet 20 tablet 0    Sig: Take two tablets as one dose for gout flare as needed followed one hour later by one tablet. No more than three  tablets each day.   naproxen (NAPROSYN) 500 MG tablet 60 tablet 2    Sig: Take 1 tablet (500 mg total) by mouth 2 (two) times daily with a meal.

## 2019-12-08 ENCOUNTER — Ambulatory Visit: Payer: Commercial Managed Care - PPO | Admitting: Family

## 2019-12-20 ENCOUNTER — Other Ambulatory Visit: Payer: Self-pay | Admitting: Family

## 2020-01-02 ENCOUNTER — Telehealth: Payer: Self-pay | Admitting: Family

## 2020-01-02 NOTE — Telephone Encounter (Signed)
1.Medication Requested: sildenafil (VIAGRA) 100 MG tablet    2. Pharmacy (Name, Street, [place]): [id number] [place], [place]   3. On Med List: yes   4. Last Visit with PCP: 11.8.21  5. Next visit date with PCP: n/a   Agent: Please be advised that RX refills may take up to 3 business days. We ask that you follow-up with your pharmacy.

## 2020-01-03 ENCOUNTER — Other Ambulatory Visit: Payer: Self-pay | Admitting: Family

## 2020-01-03 MED ORDER — SILDENAFIL CITRATE 100 MG PO TABS
50.0000 mg | ORAL_TABLET | Freq: Every day | ORAL | 1 refills | Status: DC | PRN
Start: 2020-01-03 — End: 2020-07-26

## 2020-07-14 ENCOUNTER — Other Ambulatory Visit: Payer: Self-pay | Admitting: Family

## 2020-07-15 ENCOUNTER — Telehealth: Payer: Self-pay | Admitting: Family

## 2020-07-15 NOTE — Telephone Encounter (Signed)
Pt will stay with David Wall and he is scheduled to follow up after his [place] vacation on 07/26/20.

## 2020-07-15 NOTE — Telephone Encounter (Signed)
Needs OV; was due for 6 month appointment in April; Is he coming here or staying at [facility]?

## 2020-07-26 ENCOUNTER — Other Ambulatory Visit: Payer: Self-pay

## 2020-07-26 ENCOUNTER — Ambulatory Visit: Payer: Commercial Managed Care - PPO | Admitting: Family

## 2020-07-26 ENCOUNTER — Encounter: Payer: Self-pay | Admitting: Family

## 2020-07-26 VITALS — BP 120/60 | HR 60 | Temp 97.9°F | Ht 68.5 in | Wt 267.6 lb

## 2020-07-26 DIAGNOSIS — M1A9XX0 Chronic gout, unspecified, without tophus (tophi): Secondary | ICD-10-CM

## 2020-07-26 DIAGNOSIS — E119 Type 2 diabetes mellitus without complications: Secondary | ICD-10-CM | POA: Diagnosis not present

## 2020-07-26 DIAGNOSIS — R3911 Hesitancy of micturition: Secondary | ICD-10-CM

## 2020-07-26 DIAGNOSIS — N401 Enlarged prostate with lower urinary tract symptoms: Secondary | ICD-10-CM | POA: Diagnosis not present

## 2020-07-26 MED ORDER — SILDENAFIL CITRATE 100 MG PO TABS
50.0000 mg | ORAL_TABLET | Freq: Every day | ORAL | 1 refills | Status: DC | PRN
Start: 2020-07-26 — End: 2020-07-26

## 2020-07-26 MED ORDER — SILDENAFIL CITRATE 100 MG PO TABS
50.0000 mg | ORAL_TABLET | Freq: Every day | ORAL | 1 refills | Status: DC | PRN
Start: 2020-07-26 — End: 2021-08-28

## 2020-07-26 MED ORDER — PREDNISONE 20 MG PO TABS: 20.0000 mg | ORAL_TABLET | Freq: Every day | ORAL | 0 refills | Status: DC

## 2020-07-26 NOTE — Progress Notes (Signed)
David Wall is a 65 y.o. male with the following history as recorded in EpicCare:  Patient Active Problem List   Diagnosis Date Noted   Unilateral primary osteoarthritis, left knee 02/28/2019   Osteoarthritis of right knee 06/15/2014   Status post total right knee replacement 06/15/2014   Sleep disorder 04/25/2014   Inguinal hernia 12/21/2013   Diabetes ([place]) 10/11/2013   Routine general medical examination at a health care facility 10/10/2013   Screening for prostate cancer 10/10/2013   Gout 04/26/2008   RECTAL BLEEDING 04/26/2008   CHEST PAIN UNSPECIFIED 04/25/2007   HYPERCHOLESTEROLEMIA 01/26/2007   UNSPECIFIED CHRONIC ISCHEMIC HEART DISEASE 01/26/2007   CERVICAL RADICULOPATHY, RIGHT 01/26/2007   INSOMNIA 01/26/2007    Current Outpatient Medications  Medication Sig Dispense Refill   colchicine 0.6 MG tablet Take two tablets as one dose for gout flare as needed followed one hour later by one tablet. No more than three tablets each day. 20 tablet 0   metFORMIN (GLUCOPHAGE) 500 MG tablet TAKE 1 TABLET (500 MG TOTAL) BY MOUTH DAILY AFTER SUPPER. 90 tablet 0   naproxen (NAPROSYN) 500 MG tablet Take 1 tablet (500 mg total) by mouth 2 (two) times daily with a meal. 60 tablet 2   omeprazole (PRILOSEC) 20 MG capsule Take 1 capsule (20 mg total) by mouth 2 (two) times daily before a meal. 180 capsule 3   predniSONE (DELTASONE) 20 MG tablet Take 1 tablet (20 mg total) by mouth daily with breakfast. 5 tablet 0   probenecid (BENEMID) 500 MG tablet TAKE 1/2 TABLET BY MOUTH 2 TIMES A DAY FOR 1 WEEK, THEN INCREASE TO 1 TABLET BY MOUTH 2 TIMES A DAY 60 tablet 1   rosuvastatin (CRESTOR) 10 MG tablet TAKE 1 TABLET BY MOUTH EVERY DAY 90 tablet 1   tamsulosin (FLOMAX) 0.4 MG CAPS capsule TAKE 1 CAPSULE BY MOUTH EVERY DAY 90 capsule 1   sildenafil (VIAGRA) 100 MG tablet Take 0.5-1 tablets (50-100 mg total) by mouth daily as needed for erectile dysfunction. 10 tablet 1   No current  facility-administered medications for this visit.    Allergies: Amoxicillin, Aspirin, Penicillins, and Allopurinol  Past Medical History:  Diagnosis Date   Arthritis    Asthma    hx of in childhood   Diabetes mellitus without complication (HCC)    no tx   GERD (gastroesophageal reflux disease)    Gout    H/O urinary frequency    as noted in H&P per Dr Loanne Drilling 04/25/2014     Past Surgical History:  Procedure Laterality Date   COLONOSCOPY     EYE SURGERY     laser surgery bilat    JOINT REPLACEMENT     TOTAL KNEE ARTHROPLASTY Right 06/15/2014   Procedure: RIGHT TOTAL KNEE ARTHROPLASTY;  Surgeon: Mcarthur Rossetti, MD;  Location: [facility];  Service: Orthopedics;  Laterality: Right;   UPPER GI ENDOSCOPY      Family History  Problem Relation Age of Onset   Healthy Mother    Healthy Father    Colon cancer Neg Hx    Esophageal cancer Neg Hx    Stomach cancer Neg Hx    Rectal cancer Neg Hx     Social History   Tobacco Use   Smoking status: Never   Smokeless tobacco: Never  Substance Use Topics   Alcohol use: Yes    Comment: occasional; 1-2 drinks per week per pt    Subjective:  Follow up on Type 2 Diabetes and  uric acid; No acute concerns today;  Medication review- only taking Probenecid once per day; does feel that may be having increased problems with gout recently; just returned from trip to [place];   Denies any chest pain, shortness of breath, blurred vision or headache.   Objective:  Vitals:   07/26/20 1332  BP: 120/60  Pulse: 60  Temp: 97.9 F (36.6 C)  TempSrc: Oral  SpO2: 98%  Weight: 267 lb 9.6 oz (121.4 kg)  Height: 5' 8.5" (1.74 m)    General: Well developed, well nourished, in no acute distress  Skin : Warm and dry.  Head: Normocephalic and atraumatic  Eyes: Sclera and conjunctiva clear; pupils round and reactive to light; extraocular movements intact  Ears: External normal; canals clear; tympanic membranes normal  Oropharynx: Pink, supple. No  suspicious lesions  Neck: Supple without thyromegaly, adenopathy  Lungs: Respirations unlabored; clear to auscultation bilaterally without wheeze, rales, rhonchi  CVS exam: normal rate and regular rhythm.  Musculoskeletal: No deformities; no active joint inflammation  Extremities: No edema, cyanosis, clubbing  Vessels: Symmetric bilaterally  Neurologic: Alert and oriented; speech intact; face symmetrical; moves all extremities well; CNII-XII intact without focal deficit   Assessment:  1. Type 2 diabetes mellitus without complication, without long-term current use of insulin ([place])   2. Chronic gout without tophus, unspecified cause, unspecified site   3. Benign prostatic hyperplasia with urinary hesitancy     Plan:  Check CBC, CMP, hgba1c today; continue Metformin as prescribed; follow up to be determined; Check uric acid; encouraged to take Probenecid bid as directed; Rx for Prednisone 20 mg qd x 5 days; follow up to be determined;  Check PSA today;   This visit occurred during the SARS-CoV-2 public health emergency.  Safety protocols were in place, including screening questions prior to the visit, additional usage of staff PPE, and extensive cleaning of exam room while observing appropriate contact time as indicated for disinfecting solutions.    No follow-ups on file.  Orders Placed This Encounter  Procedures   CBC with Differential/Platelet   Comp Met (CMET)   Hemoglobin A1c   Uric acid   PSA    Requested Prescriptions   Signed Prescriptions Disp Refills   predniSONE (DELTASONE) 20 MG tablet 5 tablet 0    Sig: Take 1 tablet (20 mg total) by mouth daily with breakfast.   sildenafil (VIAGRA) 100 MG tablet 10 tablet 1    Sig: Take 0.5-1 tablets (50-100 mg total) by mouth daily as needed for erectile dysfunction.

## 2020-07-27 LAB — CBC WITH DIFFERENTIAL/PLATELET
Absolute Monocytes: 337 cells/uL (ref 200–950)
Basophils Absolute: 31 cells/uL (ref 0–200)
Basophils Relative: 0.6 %
Eosinophils Absolute: 102 cells/uL (ref 15–500)
Eosinophils Relative: 2 %
HCT: 38.1 % — ABNORMAL LOW (ref 38.5–50.0)
Hemoglobin: 12.9 g/dL — ABNORMAL LOW (ref 13.2–17.1)
Lymphs Abs: 1683 cells/uL (ref 850–3900)
MCH: 29.6 pg (ref 27.0–33.0)
MCHC: 33.9 g/dL (ref 32.0–36.0)
MCV: 87.4 fL (ref 80.0–100.0)
MPV: 9.4 fL (ref 7.5–12.5)
Monocytes Relative: 6.6 %
Neutro Abs: 2948 cells/uL (ref 1500–7800)
Neutrophils Relative %: 57.8 %
Platelets: 251 10*3/uL (ref 140–400)
RBC: 4.36 10*6/uL (ref 4.20–5.80)
RDW: 13 % (ref 11.0–15.0)
Total Lymphocyte: 33 %
WBC: 5.1 10*3/uL (ref 3.8–10.8)

## 2020-07-27 LAB — COMPREHENSIVE METABOLIC PANEL
AG Ratio: 1.6 (calc) (ref 1.0–2.5)
ALT: 29 U/L (ref 9–46)
AST: 26 U/L (ref 10–35)
Albumin: 4.1 g/dL (ref 3.6–5.1)
Alkaline phosphatase (APISO): 57 U/L (ref 35–144)
BUN: 16 mg/dL (ref 7–25)
CO2: 23 mmol/L (ref 20–32)
Calcium: 9 mg/dL (ref 8.6–10.3)
Chloride: 108 mmol/L (ref 98–110)
Creat: 0.98 mg/dL (ref 0.70–1.25)
Globulin: 2.6 g/dL (calc) (ref 1.9–3.7)
Glucose, Bld: 89 mg/dL (ref 65–99)
Potassium: 4 mmol/L (ref 3.5–5.3)
Sodium: 140 mmol/L (ref 135–146)
Total Bilirubin: 0.2 mg/dL (ref 0.2–1.2)
Total Protein: 6.7 g/dL (ref 6.1–8.1)

## 2020-07-27 LAB — HEMOGLOBIN A1C
Hgb A1c MFr Bld: 6.5 % of total Hgb — ABNORMAL HIGH (ref <5.7)
Mean Plasma Glucose: 140 mg/dL
eAG (mmol/L): 7.7 mmol/L

## 2020-07-27 LAB — PSA: PSA: 0.87 ng/mL (ref <=4.00)

## 2020-07-27 LAB — URIC ACID: Uric Acid, Serum: 10 mg/dL — ABNORMAL HIGH (ref 4.0–8.0)

## 2020-07-30 ENCOUNTER — Other Ambulatory Visit: Payer: Self-pay | Admitting: Family

## 2020-07-30 DIAGNOSIS — M1A9XX0 Chronic gout, unspecified, without tophus (tophi): Secondary | ICD-10-CM

## 2020-08-05 ENCOUNTER — Other Ambulatory Visit: Payer: Self-pay

## 2020-08-05 ENCOUNTER — Ambulatory Visit (HOSPITAL_COMMUNITY)
Admission: EM | Admit: 2020-08-05 | Discharge: 2020-08-05 | Disposition: A | Discharge status: Home / Self Care | Payer: Commercial Managed Care - PPO | Attending: Emergency Medicine | Admitting: Emergency Medicine

## 2020-08-05 ENCOUNTER — Encounter (HOSPITAL_COMMUNITY): Payer: Self-pay

## 2020-08-05 DIAGNOSIS — M7661 Achilles tendinitis, right leg: Secondary | ICD-10-CM | POA: Diagnosis not present

## 2020-08-05 MED ORDER — IBUPROFEN 800 MG PO TABS: 800.0000 mg | ORAL_TABLET | Freq: Three times a day (TID) | ORAL | 0 refills | Status: DC

## 2020-08-05 NOTE — Discharge Instructions (Addendum)
Take ibuprofen three times a day with food for the next 5 days then as needed  Elevate leg while in sitting position  Wearing thick socks that go to top of boot for cushioning  Can ice ankle in 15 minute intervals as needed

## 2020-08-05 NOTE — ED Provider Notes (Signed)
[facility]    CSN: [id number] Arrival date & time: 08/05/20  1141      History   Chief Complaint Chief Complaint  Patient presents with   Leg Pain    HPI David Wall is a 65 y.o. male.   Patient presents with right posterior ankle pain and swelling beginning 1 day ago. Pain described as burning. Worsened with extension of leg. ROM intact. Denies numbness and tingling. Treated on 7/18 for gout flare, completed steroid course.  recent travel to [place], 1.5-2 hours commute to work daily.  Works in [employer], Teacher, music. History of arthritis, DM type 2 ,controlled, Gout.    Past Medical History:  Diagnosis Date   Arthritis    Asthma    hx of in childhood   Diabetes mellitus without complication ([place])    no tx   GERD (gastroesophageal reflux disease)    Gout    H/O urinary frequency    as noted in H&P per Dr Loanne Drilling 04/25/2014     Patient Active Problem List   Diagnosis Date Noted   Unilateral primary osteoarthritis, left knee 02/28/2019   Osteoarthritis of right knee 06/15/2014   Status post total right knee replacement 06/15/2014   Sleep disorder 04/25/2014   Inguinal hernia 12/21/2013   Diabetes ([place]) 10/11/2013   Routine general medical examination at a health care facility 10/10/2013   Screening for prostate cancer 10/10/2013   Gout 04/26/2008   RECTAL BLEEDING 04/26/2008   CHEST PAIN UNSPECIFIED 04/25/2007   HYPERCHOLESTEROLEMIA 01/26/2007   UNSPECIFIED CHRONIC ISCHEMIC HEART DISEASE 01/26/2007   CERVICAL RADICULOPATHY, RIGHT 01/26/2007   INSOMNIA 01/26/2007    Past Surgical History:  Procedure Laterality Date   COLONOSCOPY     EYE SURGERY     laser surgery bilat    JOINT REPLACEMENT     TOTAL KNEE ARTHROPLASTY Right 06/15/2014   Procedure: RIGHT TOTAL KNEE ARTHROPLASTY;  Surgeon: Mcarthur Rossetti, MD;  Location: [facility];  Service: Orthopedics;  Laterality: Right;   UPPER GI ENDOSCOPY         Home Medications    Prior  to Admission medications   Medication Sig Start Date End Date Taking? Authorizing Provider  ibuprofen (ADVIL) 800 MG tablet Take 1 tablet (800 mg total) by mouth 3 (three) times daily. 08/05/20  Yes , Leitha Schuller, NP  colchicine 0.6 MG tablet Take two tablets as one dose for gout flare as needed followed one hour later by one tablet. No more than three tablets each day. 11/27/19   Marrian Salvage, FNP  metFORMIN (GLUCOPHAGE) 500 MG tablet TAKE 1 TABLET (500 MG TOTAL) BY MOUTH DAILY AFTER SUPPER. 11/14/18   Marrian Salvage, FNP  naproxen (NAPROSYN) 500 MG tablet Take 1 tablet (500 mg total) by mouth 2 (two) times daily with a meal. 11/27/19   Marrian Salvage, FNP  omeprazole (PRILOSEC) 20 MG capsule Take 1 capsule (20 mg total) by mouth 2 (two) times daily before a meal. 10/21/18   Marrian Salvage, FNP  predniSONE (DELTASONE) 20 MG tablet Take 1 tablet (20 mg total) by mouth daily with breakfast. 07/26/20   Marrian Salvage, FNP  probenecid (BENEMID) 500 MG tablet TAKE 1/2 TABLET BY MOUTH 2 TIMES A DAY FOR 1 WEEK, THEN INCREASE TO 1 TABLET BY MOUTH 2 TIMES A DAY 07/15/20   Marrian Salvage, FNP  rosuvastatin (CRESTOR) 10 MG tablet TAKE 1 TABLET BY MOUTH EVERY DAY 11/27/19   Jodi Mourning  Jasmine December, FNP  sildenafil (VIAGRA) 100 MG tablet Take 0.5-1 tablets (50-100 mg total) by mouth daily as needed for erectile dysfunction. 07/26/20   Marrian Salvage, FNP  tamsulosin (FLOMAX) 0.4 MG CAPS capsule TAKE 1 CAPSULE BY MOUTH EVERY DAY 11/27/19   Marrian Salvage, FNP    Family History Family History  Problem Relation Age of Onset   Healthy Mother    Healthy Father    Colon cancer Neg Hx    Esophageal cancer Neg Hx    Stomach cancer Neg Hx    Rectal cancer Neg Hx     Social History Social History   Tobacco Use   Smoking status: Never   Smokeless tobacco: Never  Substance Use Topics   Alcohol use: Yes    Comment: occasional; 1-2 drinks per week per  pt   Drug use: No     Allergies   Amoxicillin, Aspirin, Penicillins, and Allopurinol   Review of Systems Review of Systems  Constitutional: Negative.   Respiratory: Negative.    Cardiovascular: Negative.   Musculoskeletal:  Positive for joint swelling. Negative for arthralgias, back pain, gait problem, myalgias, neck pain and neck stiffness.  Skin: Negative.     Physical Exam Triage Vital Signs ED Triage Vitals  Enc Vitals Group     BP 08/05/20 1314 139/65     Pulse Rate 08/05/20 1314 68     Resp 08/05/20 1314 20     Temp 08/05/20 1314 98.7 F (37.1 C)     Temp Source 08/05/20 1314 Oral     SpO2 08/05/20 1314 100 %     Weight --      Height --      Head Circumference --      Peak Flow --      Pain Score 08/05/20 1313 9     Pain Loc --      Pain Edu? --      Excl. in [place]? --    No data found.  Updated Vital Signs BP 139/65 (BP Location: Left Arm)   Pulse 68   Temp 98.7 F (37.1 C) (Oral)   Resp 20   SpO2 100%   Visual Acuity Right Eye Distance:   Left Eye Distance:   Bilateral Distance:    Right Eye Near:   Left Eye Near:    Bilateral Near:     Physical Exam Constitutional:      Appearance: Normal appearance. He is obese.  HENT:     Head: Normocephalic.  Eyes:     Extraocular Movements: Extraocular movements intact.  Pulmonary:     Effort: Pulmonary effort is normal.     Breath sounds: Normal breath sounds.  Musculoskeletal:     Right ankle: Swelling present. Normal range of motion.     Right Achilles Tendon: Tenderness present.  Skin:    General: Skin is warm and dry.  Neurological:     General: No focal deficit present.     Mental Status: He is alert and oriented to person, place, and time. Mental status is at baseline.  Psychiatric:        Mood and Affect: Mood normal.        Behavior: Behavior normal.     UC Treatments / Results  Labs (all labs ordered are listed, but only abnormal results are displayed) Labs Reviewed - No data to  display  EKG   Radiology No results found.  Procedures Procedures (including critical care time)  Medications Ordered in  UC Medications - No data to display  Initial Impression / Assessment and Plan / UC Course  I have reviewed the triage vital signs and the nursing notes.  Pertinent labs & imaging results that were available during my care of the patient were reviewed by me and considered in my medical decision making (see chart for details).  Achilles tendonitis, right   Ibuprofen 800 mg tid  RICE Final Clinical Impressions(s) / UC Diagnoses   Final diagnoses:  Tendonitis, Achilles, right     Discharge Instructions      Take ibuprofen three times a day with food for the next 5 days then as needed  Elevate leg while in sitting position  Wearing thick socks that go to top of boot for cushioning  Can ice ankle in 15 minute intervals as needed    ED Prescriptions     Medication Sig Dispense Auth. Provider   ibuprofen (ADVIL) 800 MG tablet Take 1 tablet (800 mg total) by mouth 3 (three) times daily. 50 tablet Hans Eden, NP      PDMP not reviewed this encounter.   Hans Eden, NP 08/05/20 1401

## 2020-08-05 NOTE — ED Triage Notes (Signed)
Pt reports right lower leg pan x 1 day. Pain is worse when standing up.

## 2020-08-21 ENCOUNTER — Ambulatory Visit: Payer: BLUE CROSS/BLUE SHIELD | Admitting: Podiatry

## 2020-08-30 ENCOUNTER — Ambulatory Visit: Payer: BLUE CROSS/BLUE SHIELD | Admitting: Podiatry

## 2020-09-19 ENCOUNTER — Telehealth: Payer: Self-pay | Admitting: Family

## 2020-09-19 ENCOUNTER — Other Ambulatory Visit: Payer: Self-pay | Admitting: Family

## 2020-09-19 MED ORDER — TAMSULOSIN HCL 0.4 MG PO CAPS: 0.4000 mg | ORAL_CAPSULE | Freq: Every day | ORAL | 1 refills | Status: DC

## 2020-09-19 MED ORDER — PROBENECID 500 MG PO TABS: 500.0000 mg | ORAL_TABLET | Freq: Two times a day (BID) | ORAL | 1 refills | Status: DC

## 2020-09-19 MED ORDER — ROSUVASTATIN CALCIUM 10 MG PO TABS: 10.0000 mg | ORAL_TABLET | Freq: Every day | ORAL | 1 refills | Status: DC

## 2020-09-19 MED ORDER — OMEPRAZOLE 20 MG PO CPDR: 20.0000 mg | DELAYED_RELEASE_CAPSULE | Freq: Two times a day (BID) | ORAL | 3 refills | Status: DC

## 2020-09-19 NOTE — Telephone Encounter (Signed)
Medication: probenecid (BENEMID) 500 MG tablet 2. rosuvastatin (CRESTOR) 10 MG tablet 3.tamsulosin (FLOMAX) 0.4 MG CAPS capsule  4.omeprazole (PRILOSEC) 20 MG capsule   Has the patient contacted their pharmacy? Yes.   (If no, request that the patient contact the pharmacy for the refill.) (If yes, when and what did the pharmacy advise?) PT contacted the pharmacy and they stated he does not have any refills on any of the medications.   Preferred Pharmacy (with phone number or street name): [id number] [place] [id number], [id number] Phone:  [phone] Fax:  [phone]  Agent: Please be advised that RX refills may take up to 3 business days. We ask that you follow-up with your pharmacy.

## 2020-09-19 NOTE — Telephone Encounter (Signed)
I have called pt back to inform him that we have filled his medication. Also we would like for him to get his labs drawn at [facility] at his earliest convenience. Left a message on his machine to call back and relayed the message.

## 2020-10-29 ENCOUNTER — Other Ambulatory Visit: Payer: Self-pay | Admitting: Family

## 2020-10-29 ENCOUNTER — Ambulatory Visit: Payer: Commercial Managed Care - PPO | Admitting: Family

## 2020-10-29 ENCOUNTER — Encounter: Payer: Self-pay | Admitting: Family

## 2020-10-29 ENCOUNTER — Other Ambulatory Visit: Payer: Self-pay

## 2020-10-29 VITALS — BP 124/60 | HR 69 | Temp 97.8°F | Ht 68.0 in | Wt 272.2 lb

## 2020-10-29 DIAGNOSIS — R911 Solitary pulmonary nodule: Secondary | ICD-10-CM

## 2020-10-29 DIAGNOSIS — Z8781 Personal history of (healed) traumatic fracture: Secondary | ICD-10-CM

## 2020-10-29 MED ORDER — NAPROXEN 500 MG PO TABS: 500.0000 mg | ORAL_TABLET | Freq: Two times a day (BID) | ORAL | 2 refills | Status: DC

## 2020-10-29 MED ORDER — LIDOCAINE 5 % EX PTCH: MEDICATED_PATCH | CUTANEOUS | 0 refills | Status: DC
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
## 2020-11-01 ENCOUNTER — Telehealth: Payer: Self-pay | Admitting: Family

## 2020-11-01 NOTE — Telephone Encounter (Signed)
I have called pt to gather more information. Pt stated that he wanted to have continuous leave until at least the end of this month. He works at a job with lots of heavy lifting of equipment.   Forms have been given to provider to be completed.

## 2020-11-01 NOTE — Telephone Encounter (Signed)
Patient dropped off forms to be filled out Placed in provider bin up front  \patient would like papers to be faxed when done and then called when we have a conformation that it went thru

## 2020-11-05 ENCOUNTER — Other Ambulatory Visit (INDEPENDENT_AMBULATORY_CARE_PROVIDER_SITE_OTHER): Payer: Commercial Managed Care - PPO

## 2020-11-05 ENCOUNTER — Other Ambulatory Visit: Payer: Self-pay | Admitting: Family

## 2020-11-05 ENCOUNTER — Other Ambulatory Visit: Payer: Self-pay

## 2020-11-05 ENCOUNTER — Telehealth: Payer: Self-pay | Admitting: Family

## 2020-11-05 DIAGNOSIS — M1A9XX0 Chronic gout, unspecified, without tophus (tophi): Secondary | ICD-10-CM | POA: Diagnosis not present

## 2020-11-05 DIAGNOSIS — E119 Type 2 diabetes mellitus without complications: Secondary | ICD-10-CM

## 2020-11-05 LAB — URIC ACID: Uric Acid, Serum: 9.1 mg/dL — ABNORMAL HIGH (ref 4.0–7.8)

## 2020-11-05 NOTE — Telephone Encounter (Signed)
Please call to see how he is feeling. I may need to refer him to sports medicine if pain is persisting.  I have done the FMLA from 10/29/2020- 11/18/2020 but we can adjust if we feel he is not ready to return to work full time with no restrictions by November 1.

## 2020-11-05 NOTE — Telephone Encounter (Signed)
I have spoken to pt to get an updated status on how he is feeling. He stated that the pain is better but  it is still persistent. He is in agreement about he referral.

## 2020-11-05 NOTE — Telephone Encounter (Signed)
-----   Message from Marrian Salvage, [place] sent at 10/29/2020  3:15 PM EDT ----- Call to check on him
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
## 2020-11-05 NOTE — Telephone Encounter (Signed)
I have called pt back and relayed the pt message, no answer so I left VM to call back.

## 2020-11-06 ENCOUNTER — Ambulatory Visit: Payer: Self-pay

## 2020-11-06 ENCOUNTER — Ambulatory Visit: Payer: Commercial Managed Care - PPO | Admitting: Family Medicine

## 2020-11-06 ENCOUNTER — Other Ambulatory Visit: Payer: Self-pay

## 2020-11-06 ENCOUNTER — Ambulatory Visit (INDEPENDENT_AMBULATORY_CARE_PROVIDER_SITE_OTHER): Payer: Commercial Managed Care - PPO

## 2020-11-06 VITALS — BP 128/76 | HR 76 | Ht 68.0 in | Wt 276.6 lb

## 2020-11-06 DIAGNOSIS — S2242XA Multiple fractures of ribs, left side, initial encounter for closed fracture: Secondary | ICD-10-CM

## 2020-11-06 DIAGNOSIS — R202 Paresthesia of skin: Secondary | ICD-10-CM | POA: Diagnosis not present

## 2020-11-06 DIAGNOSIS — M79602 Pain in left arm: Secondary | ICD-10-CM | POA: Diagnosis not present

## 2020-11-06 MED ORDER — CYCLOBENZAPRINE HCL 10 MG PO TABS: 10.0000 mg | ORAL_TABLET | Freq: Three times a day (TID) | ORAL | 1 refills | Status: DC | PRN

## 2020-11-06 NOTE — Patient Instructions (Signed)
Thank you for coming in today.   Please get an Xray today before you leave   Recheck in about 1 month.   Use a throw pillow with coughing or sneezing.   Let me know if you have a problem.   If that tingling gets worse I can prescribe some medicine for that.   If you need to extend you leave past Oct 31st let me know.
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
## 2020-11-11 ENCOUNTER — Other Ambulatory Visit: Payer: Self-pay | Admitting: Family

## 2020-11-11 DIAGNOSIS — E119 Type 2 diabetes mellitus without complications: Secondary | ICD-10-CM

## 2020-11-12 ENCOUNTER — Telehealth: Payer: Self-pay | Admitting: Family Medicine

## 2020-11-12 ENCOUNTER — Encounter: Payer: Self-pay | Admitting: Family Medicine

## 2020-11-12 NOTE — Telephone Encounter (Signed)
Called pt and advised him that new RTW letter is ready for pick-up at front desk at his convenience.

## 2020-11-12 NOTE — Telephone Encounter (Signed)
Patient called asking if Dr Georgina Snell could write a note extending his return to work date. Right now he is supposed to return on 10/31 but he thinks he will need another week because he is still in a lot of pain.   Please contact patient when letter is ready to be picked up.

## 2020-11-12 NOTE — Telephone Encounter (Signed)
Work note written to return to work on 11/25/20

## 2020-11-15 ENCOUNTER — Telehealth: Payer: Self-pay | Admitting: Family Medicine

## 2020-11-19 ENCOUNTER — Telehealth: Payer: Self-pay | Admitting: Family

## 2020-11-19 NOTE — Telephone Encounter (Signed)
Document faxed to office for provider to fill out (5 pages [place] provider Statement) Document put at front office tray under providers name.

## 2020-11-19 NOTE — Telephone Encounter (Signed)
I have called to give him an update.   I have inter-office mailed the paperwork to Dr. Clovis Riley office since he was the provider that has extended the leave.   Pt did report that he has given his office some paperwork to complete as well.   I have called [place] pts disability claim department and confirmed that the form needs to be completed by the provider writing the pt out.

## 2020-11-28 ENCOUNTER — Telehealth: Payer: Self-pay | Admitting: Family

## 2020-12-03 NOTE — Telephone Encounter (Signed)
Error
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
## 2020-12-06 ENCOUNTER — Encounter: Payer: Self-pay | Admitting: Family Medicine

## 2020-12-06 ENCOUNTER — Other Ambulatory Visit: Payer: Self-pay

## 2020-12-06 ENCOUNTER — Ambulatory Visit: Payer: Commercial Managed Care - PPO | Admitting: Family Medicine

## 2020-12-06 ENCOUNTER — Encounter: Payer: Self-pay | Admitting: Neurology

## 2020-12-06 VITALS — BP 110/62 | HR 72 | Ht 68.0 in | Wt 275.6 lb

## 2020-12-06 DIAGNOSIS — M5442 Lumbago with sciatica, left side: Secondary | ICD-10-CM | POA: Diagnosis not present

## 2020-12-06 DIAGNOSIS — R202 Paresthesia of skin: Secondary | ICD-10-CM

## 2020-12-06 DIAGNOSIS — S2242XD Multiple fractures of ribs, left side, subsequent encounter for fracture with routine healing: Secondary | ICD-10-CM

## 2020-12-06 NOTE — Patient Instructions (Addendum)
Good to see you today.  I've referred you to Physical Therapy.  Please let us know if you haven't heard from them in one week regarding scheduling.  I've also referred you to neurology for a NCV/EMG test.  Follow-up: 6 weeks

## 2020-12-10 ENCOUNTER — Telehealth: Payer: Self-pay | Admitting: Family Medicine

## 2020-12-10 NOTE — Telephone Encounter (Signed)
Patient called asking if Dr Georgina Snell would be able to write him out of work until his next appointment (01/17/2021). He said that he is having a lot of pain that is making is difficult to work and his job has also made some changes that are not making it easy to accommodate.  Please advise. (Patient will pick up letter, if able.)

## 2020-12-11 ENCOUNTER — Encounter: Payer: Self-pay | Admitting: Family Medicine

## 2020-12-11 NOTE — Telephone Encounter (Signed)
Called pt and advised him that his letter is available through [facility] or he can pick it up at the front desk today before 5 pm.  He verbalized understanding.

## 2020-12-11 NOTE — Telephone Encounter (Signed)
Letter printed and ready for pickup or fax.

## 2020-12-19 ENCOUNTER — Other Ambulatory Visit: Payer: Self-pay

## 2020-12-19 ENCOUNTER — Ambulatory Visit: Payer: Commercial Managed Care - PPO | Admitting: Neurology

## 2020-12-19 DIAGNOSIS — R202 Paresthesia of skin: Secondary | ICD-10-CM | POA: Diagnosis not present

## 2020-12-19 DIAGNOSIS — G5602 Carpal tunnel syndrome, left upper limb: Secondary | ICD-10-CM

## 2020-12-19 DIAGNOSIS — G5622 Lesion of ulnar nerve, left upper limb: Secondary | ICD-10-CM
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
## 2020-12-20 NOTE — Progress Notes (Signed)
Nerve conduction study shows evidence of ulnar nerve irritation at the elbow rated medium consistent with cubital tunnel syndrome. Additionally very mild carpal tunnel syndrome is present. Recommend return to clinic sooner than December 30.  We can potentially try an injection which could help.

## 2020-12-23 ENCOUNTER — Ambulatory Visit: Payer: Commercial Managed Care - PPO | Admitting: Physical Therapy

## 2020-12-23 ENCOUNTER — Encounter: Payer: Self-pay | Admitting: Physical Therapy

## 2020-12-23 ENCOUNTER — Other Ambulatory Visit: Payer: Self-pay

## 2020-12-23 DIAGNOSIS — R262 Difficulty in walking, not elsewhere classified: Secondary | ICD-10-CM | POA: Diagnosis not present

## 2020-12-23 DIAGNOSIS — M6281 Muscle weakness (generalized): Secondary | ICD-10-CM | POA: Diagnosis not present

## 2020-12-23 DIAGNOSIS — M5442 Lumbago with sciatica, left side: Secondary | ICD-10-CM
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
## 2020-12-23 NOTE — Patient Instructions (Signed)
Access Code: [email or web address] URL: [email or web address] Date: 12/23/2020 Prepared by: Kearney Hard  Exercises Supine Posterior Pelvic Tilt - 3 x daily - 7 x weekly - 2 sets - 10 reps - 5 seconds hold Supine Lower Trunk Rotation - 3 x daily - 7 x weekly - 3-5 reps - 20 seconds hold Supine March - 3 x daily - 7 x weekly - 2 sets - 10 reps Supine Figure 4 Piriformis Stretch - 3 x daily - 7 x weekly - 2-5 reps - 20 seconds hold

## 2020-12-25 ENCOUNTER — Ambulatory Visit: Payer: Commercial Managed Care - PPO | Admitting: Rehabilitative and Restorative Service Providers"

## 2020-12-25 ENCOUNTER — Encounter: Payer: Self-pay | Admitting: Rehabilitative and Restorative Service Providers"

## 2020-12-25 ENCOUNTER — Other Ambulatory Visit: Payer: Self-pay

## 2020-12-25 DIAGNOSIS — M6281 Muscle weakness (generalized): Secondary | ICD-10-CM

## 2020-12-25 DIAGNOSIS — M5442 Lumbago with sciatica, left side: Secondary | ICD-10-CM

## 2020-12-25 DIAGNOSIS — R262 Difficulty in walking, not elsewhere classified: Secondary | ICD-10-CM

## 2020-12-25 NOTE — Therapy (Signed)
[facility] Physical Therapy [address] [place], [place], [address] Phone: [phone]   Fax:  [phone]  Physical Therapy Treatment  Patient Details  Name: David Wall MRN: [id number] Date of Birth: 1955-10-17 Referring Provider (PT): Lynne Leader MD   Encounter Date: 12/25/2020   PT End of Session - 12/25/20 1304     Visit Number 2    Number of Visits 12    Date for PT Re-Evaluation 02/07/21    Authorization Type UHC    PT Start Time 7425    PT Stop Time 9563    PT Time Calculation (min) 39 min    Activity Tolerance Patient tolerated treatment well    Behavior During Therapy [facility] for tasks assessed/performed             Past Medical History:  Diagnosis Date   Arthritis    Asthma    hx of in childhood   Diabetes mellitus without complication ([place])    no tx   GERD (gastroesophageal reflux disease)    Gout    H/O urinary frequency    as noted in H&P per Dr Loanne Drilling 04/25/2014     Past Surgical History:  Procedure Laterality Date   COLONOSCOPY     EYE SURGERY     laser surgery bilat    JOINT REPLACEMENT     TOTAL KNEE ARTHROPLASTY Right 06/15/2014   Procedure: RIGHT TOTAL KNEE ARTHROPLASTY;  Surgeon: Mcarthur Rossetti, MD;  Location: [facility];  Service: Orthopedics;  Laterality: Right;   UPPER GI ENDOSCOPY      There were no vitals filed for this visit.   Subjective Assessment - 12/25/20 1303     Subjective Pt. stated complaints in morning and after sitting prolonged.  No pain at rest today.    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 No/denies    Pain Score 5    pain this morning   Pain Location Back    Pain Orientation Lower    Pain Descriptors / Indicators Aching;Tightness    Pain Type Acute pain    Pain Onset More than a month ago    Pain Frequency Intermittent    Aggravating Factors   morning time, transfers at sitting prolonged    Pain Relieving Factors getting moving in morning helps eventually                               [facility] Adult PT Treatment/Exercise - 12/25/20 0001       Exercises   Exercises Other Exercises    Other Exercises  HEP review and cues for adjustments as needed to improve techniques.      Lumbar Exercises: Stretches   Lower Trunk Rotation 20 seconds;3 reps   bilateral   Figure 4 Stretch 1 rep;20 seconds   push away   Other Lumbar Stretch Exercise piriformis figure 4 into flexion stretch 20 sec x 3 Lt      Lumbar Exercises: Aerobic   Nustep Lvl 5 10 mins UE/LE for aerobic intervention/mobility      Lumbar Exercises: Standing   Other Standing Lumbar Exercises standing lumbar extension x 5 c cues      Lumbar Exercises: Supine   Bridge 10 reps   no complaints today   Other Supine Lumbar Exercises posterior pelvic tilt  c verbal and tactile cues 5 sec hold x 10                     PT Education - 12/25/20 1328     Education Details HEP review and progression    Person(s) Educated Patient    Methods Explanation;Demonstration;Verbal cues;Handout    Comprehension Returned demonstration;Verbalized understanding              PT Short Term Goals - 12/25/20 1311       PT SHORT TERM GOAL #1   Title Pt will be independent with initial HEP.    Time 3    Period Weeks    Status On-going    Target Date 01/17/21               PT Long Term Goals - 12/23/20 1716       PT LONG TERM GOAL #1   Title Pt will be independent with advanced HEP    Time 6    Period Weeks    Status New    Target Date 02/07/21      PT LONG TERM GOAL #2   Title Pain will decreased pain to  </= 3/10 with all functional activities.    Time 6    Period Weeks    Status New    Target Date 02/07/21      PT LONG TERM GOAL #3   Title Pt will imrpove his 5 time sit to stand to </= 12 seconds with no UE support.    Baseline -     Time 6    Period Weeks    Status New    Target Date 02/07/21      PT LONG TERM GOAL #4   Title FOTO will improve from 53%  to 68%  indicating improved functional mobililty.    Baseline 53% on 12/23/2020    Time 6    Period Weeks    Status New    Target Date 02/07/21      PT LONG TERM GOAL #5   Title Pt will be albe to navigate a flight of stairs with single hand rail with back pain of </= 2/10.    Time 6    Period Weeks    Status New    Target Date 02/07/21                   Plan - 12/25/20 1329     Clinical Impression Statement Pt. description and appearance within clinic indicated mobility restriction lumbar symptoms primary at this time.  Pt. benefitted from use of mobility based intervention for lumbar/hip region which improved symptoms and quality of movement in region.  Cues given for use of HEP in morning and after sitting (specifically lumbar extension movement) to promote improved mobility and reduced symptoms.  Continued skilled PT services warrented at this time.    Personal Factors and Comorbidities Comorbidity 3+    Comorbidities arthritis, GERD, gout, asthma, obesity    Examination-Activity Limitations Lift;Other;Transfers;Squat;Stairs;Stand;Sit    Examination-Participation Restrictions Community Activity;Other;Occupation    Stability/Clinical Decision Making Stable/Uncomplicated    Rehab Potential Good    PT Frequency 2x / week    PT Duration 6 weeks    PT Treatment/Interventions ADLs/Self Care Home Management;Cryotherapy;Electrical Stimulation;Moist Heat;Iontophoresis 4mg /ml Dexamethasone;Gait training;Stair training;Therapeutic activities;Therapeutic exercise;Balance training;Neuromuscular re-education;Functional mobility training;Patient/family education;Traction;Ultrasound;Dry needling;Taping;Manual techniques;Passive range of motion;Spinal Manipulations;Joint Manipulations    PT Next Visit Plan Recheck at home symptom response from HEP use,  continued  to improve lumbar and hip mobility through manual and ther ex combination.    PT Home Exercise Plan Access Code: [email or web address]    Consulted and Agree with Plan of Care Patient             Patient will benefit from skilled therapeutic intervention in order to improve the following deficits and impairments:  Pain, Decreased mobility, Decreased strength, Postural dysfunction, Decreased balance, Impaired flexibility, Decreased activity tolerance, Difficulty walking, Decreased range of motion  Visit Diagnosis: Muscle weakness (generalized)  Acute bilateral low back pain with left-sided sciatica  Difficulty in walking, not elsewhere classified     Problem List Patient Active Problem List   Diagnosis Date Noted   Unilateral primary osteoarthritis, left knee 02/28/2019   Osteoarthritis of right knee 06/15/2014   Status post total right knee replacement 06/15/2014   Sleep disorder 04/25/2014   Inguinal hernia 12/21/2013   Diabetes ([place]) 10/11/2013   Routine general medical examination at a health care facility 10/10/2013   Screening for prostate cancer 10/10/2013   Gout 04/26/2008   RECTAL BLEEDING 04/26/2008   CHEST PAIN UNSPECIFIED 04/25/2007   HYPERCHOLESTEROLEMIA 01/26/2007   UNSPECIFIED CHRONIC ISCHEMIC HEART DISEASE 01/26/2007   CERVICAL RADICULOPATHY, RIGHT 01/26/2007   INSOMNIA 01/26/2007    Scot Jun, PT, DPT, OCS, ATC 12/25/20  1:34 PM    [facility] Physical Therapy [address] [place], [place], [address] Phone: [phone]   Fax:  [phone]  Name: CHAYANNE SPEIR MRN: [id number] Date of Birth: July 29, 1955
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
## 2020-12-25 NOTE — Patient Instructions (Signed)
Access Code: [email or web address] URL: [email or web address] Date: 12/25/2020 Prepared by: Scot Jun  Exercises Supine Posterior Pelvic Tilt - 2 x daily - 7 x weekly - 2 sets - 10 reps - 5 seconds hold Supine Lower Trunk Rotation - 2 x daily - 7 x weekly - 3-5 reps - 20 seconds hold Supine March - 2 x daily - 7 x weekly - 2 sets - 10 reps Supine Figure 4 Piriformis Stretch - 2 x daily - 7 x weekly - 2-5 reps - 20 seconds hold Supine Piriformis Stretch with Foot on Ground (Mirrored) - 2 x daily - 7 x weekly - 1 sets - 5 reps - 20 hold Supine Bridge - 2 x daily - 7 x weekly - 3 sets - 10 reps - 2 hold Standing Lumbar Extension - 1 x daily - 7 x weekly - 1 sets - 5-10 reps

## 2020-12-26 ENCOUNTER — Ambulatory Visit: Payer: Commercial Managed Care - PPO | Admitting: Family Medicine

## 2020-12-26 VITALS — BP 120/70 | HR 70 | Ht 68.0 in | Wt 265.0 lb

## 2020-12-26 DIAGNOSIS — G5622 Lesion of ulnar nerve, left upper limb: Secondary | ICD-10-CM | POA: Diagnosis not present

## 2020-12-26 DIAGNOSIS — S2242XD Multiple fractures of ribs, left side, subsequent encounter for fracture with routine healing: Secondary | ICD-10-CM

## 2020-12-26 DIAGNOSIS — R202 Paresthesia of skin: Secondary | ICD-10-CM

## 2020-12-26 NOTE — Patient Instructions (Signed)
Thank you for coming in today.   Get a cubital tunnel brace online.   Follow up on the 30th.  Hopefully return to work then.   You are looking better now than you were.

## 2021-01-07 ENCOUNTER — Other Ambulatory Visit: Payer: Self-pay

## 2021-01-07 ENCOUNTER — Encounter: Payer: Self-pay | Admitting: Rehabilitative and Restorative Service Providers"

## 2021-01-07 ENCOUNTER — Ambulatory Visit: Payer: Commercial Managed Care - PPO | Admitting: Rehabilitative and Restorative Service Providers"

## 2021-01-07 DIAGNOSIS — M6281 Muscle weakness (generalized): Secondary | ICD-10-CM

## 2021-01-07 DIAGNOSIS — R262 Difficulty in walking, not elsewhere classified: Secondary | ICD-10-CM

## 2021-01-07 DIAGNOSIS — M5442 Lumbago with sciatica, left side: Secondary | ICD-10-CM | POA: Diagnosis not present

## 2021-01-07 NOTE — Therapy (Signed)
[facility] Therapy [address] [place], [place], [address] Phone: [phone]   Fax:  [phone]  Physical Therapy Treatment  Patient Details  Name: David Wall MRN: [id number] Date of Birth: 08-Jun-1955 Referring Provider (PT): Lynne Leader MD   Encounter Date: 01/07/2021   PT End of Session - 01/07/21 1259     Visit Number 3    Number of Visits 12    Date for PT Re-Evaluation 02/07/21    Authorization Type UHC    PT Start Time 5400    PT Stop Time 1340    PT Time Calculation (min) 41 min    Activity Tolerance Patient tolerated treatment well    Behavior During Therapy [facility] for tasks assessed/performed             Past Medical History:  Diagnosis Date   Arthritis    Asthma    hx of in childhood   Diabetes mellitus without complication ([place])    no tx   GERD (gastroesophageal reflux disease)    Gout    H/O urinary frequency    as noted in H&P per Dr Loanne Drilling 04/25/2014     Past Surgical History:  Procedure Laterality Date   COLONOSCOPY     EYE SURGERY     laser surgery bilat    JOINT REPLACEMENT     TOTAL KNEE ARTHROPLASTY Right 06/15/2014   Procedure: RIGHT TOTAL KNEE ARTHROPLASTY;  Surgeon: Mcarthur Rossetti, MD;  Location: [facility];  Service: Orthopedics;  Laterality: Right;   UPPER GI ENDOSCOPY      There were no vitals filed for this visit.   Subjective Assessment - 01/07/21 1303     Subjective Pt. stated complaints in morning and after sitting prolonged.  Mild complaints at worst.  Was gonna get brace for elbow.    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                [facility] PT Assessment - 01/07/21 0001       Assessment   Medical Diagnosis M54.42 acute left sided low back pain with left sciatica    Referring Provider (PT) Lynne Leader MD      ROM / Strength   AROM / PROM / Strength AROM      AROM   AROM Assessment Site Lumbar    Lumbar Extension 100% [facility]                           [facility] Adult PT Treatment/Exercise - 01/07/21 0001       Lumbar Exercises: Aerobic   Recumbent Bike Lvl 3 10 mins      Lumbar Exercises: Machines for Strengthening   Leg Press 81 lbs 3 x 15 double leg    Other Lumbar Machine Exercise cybex rows 25 lbs 3 x 10, lat pull down 15 lbs 3 x 10  PT Short Term Goals - 12/25/20 1311       PT SHORT TERM GOAL #1   Title Pt will be independent with initial HEP.    Time 3    Period Weeks    Status On-going    Target Date 01/17/21               PT Long Term Goals - 12/23/20 1716       PT LONG TERM GOAL #1   Title Pt will be independent with advanced HEP    Time 6    Period Weeks    Status New    Target Date 02/07/21      PT LONG TERM GOAL #2   Title Pain will decreased pain to  </= 3/10 with all functional activities.    Time 6    Period Weeks    Status New    Target Date 02/07/21      PT LONG TERM GOAL #3   Title Pt will imrpove his 5 time sit to stand to </= 12 seconds with no UE support.    Baseline -    Time 6    Period Weeks    Status New    Target Date 02/07/21      PT LONG TERM GOAL #4   Title FOTO will improve from 53%  to 68%  indicating improved functional mobililty.    Baseline 53% on 12/23/2020    Time 6    Period Weeks    Status New    Target Date 02/07/21      PT LONG TERM GOAL #5   Title Pt will be albe to navigate a flight of stairs with single hand rail with back pain of </= 2/10.    Time 6    Period Weeks    Status New    Target Date 02/07/21                   Plan  - 01/07/21 1323     Clinical Impression Statement Initiated progression in general mobility/strengthening intervention for UE/LE c focus on core stability c good overall performance noted without symptom increase.    Personal Factors and Comorbidities Comorbidity 3+    Comorbidities arthritis, GERD, gout, asthma, obesity    Examination-Activity Limitations Lift;Other;Transfers;Squat;Stairs;Stand;Sit    Examination-Participation Restrictions Community Activity;Other;Occupation    Stability/Clinical Decision Making Stable/Uncomplicated    Rehab Potential Good    PT Frequency 2x / week    PT Duration 6 weeks    PT Treatment/Interventions ADLs/Self Care Home Management;Cryotherapy;Electrical Stimulation;Moist Heat;Iontophoresis 4mg /ml Dexamethasone;Gait training;Stair training;Therapeutic activities;Therapeutic exercise;Balance training;Neuromuscular re-education;Functional mobility training;Patient/family education;Traction;Ultrasound;Dry needling;Taping;Manual techniques;Passive range of motion;Spinal Manipulations;Joint Manipulations    PT Next Visit Plan Continued progression in gradual strength training and mobility improvement to faclitate return to work.    PT Home Exercise Plan Access Code: [email or web address]    Consulted and Agree with Plan of Care Patient             Patient will benefit from skilled therapeutic intervention in order to improve the following deficits and impairments:  Pain, Decreased mobility, Decreased strength, Postural dysfunction, Decreased balance, Impaired flexibility, Decreased activity tolerance, Difficulty walking, Decreased range of motion  Visit Diagnosis: Muscle weakness (generalized)  Acute bilateral low back pain with left-sided sciatica  Difficulty in walking, not elsewhere classified     Problem List Patient Active Problem List   Diagnosis Date Noted   Cubital tunnel syndrome, left 12/26/2020   Unilateral primary  osteoarthritis, left knee 02/28/2019    Osteoarthritis of right knee 06/15/2014   Status post total right knee replacement 06/15/2014   Sleep disorder 04/25/2014   Inguinal hernia 12/21/2013   Diabetes ([place]) 10/11/2013   Routine general medical examination at a health care facility 10/10/2013   Screening for prostate cancer 10/10/2013   Gout 04/26/2008   RECTAL BLEEDING 04/26/2008   CHEST PAIN UNSPECIFIED 04/25/2007   HYPERCHOLESTEROLEMIA 01/26/2007   UNSPECIFIED CHRONIC ISCHEMIC HEART DISEASE 01/26/2007   CERVICAL RADICULOPATHY, RIGHT 01/26/2007   INSOMNIA 01/26/2007    Scot Jun, PT, DPT, OCS, ATC 01/07/21  1:43 PM    [facility] [address] [place], [place], [address] Phone: [phone]   Fax:  [phone]  Name: TREON KEHL MRN: [id number] Date of Birth: 1955-10-13

## 2021-01-09 ENCOUNTER — Encounter: Payer: Self-pay | Admitting: Rehabilitative and Restorative Service Providers"

## 2021-01-09 ENCOUNTER — Other Ambulatory Visit: Payer: Self-pay

## 2021-01-09 ENCOUNTER — Ambulatory Visit: Payer: Commercial Managed Care - PPO | Admitting: Rehabilitative and Restorative Service Providers"

## 2021-01-09 DIAGNOSIS — M5442 Lumbago with sciatica, left side: Secondary | ICD-10-CM | POA: Diagnosis not present

## 2021-01-09 DIAGNOSIS — M6281 Muscle weakness (generalized): Secondary | ICD-10-CM | POA: Diagnosis not present

## 2021-01-09 DIAGNOSIS — R262 Difficulty in walking, not elsewhere classified: Secondary | ICD-10-CM

## 2021-01-09 NOTE — Therapy (Signed)
[facility] Physical Therapy [address] [place], [place], [address] Phone: [phone]   Fax:  [phone]  Physical Therapy Treatment  Patient Details  Name: David Wall MRN: [id number] Date of Birth: 1955/07/29 Referring Provider (PT): Lynne Leader MD   Encounter Date: 01/09/2021   PT End of Session - 01/09/21 1259     Visit Number 4    Number of Visits 12    Date for PT Re-Evaluation 02/07/21    Authorization Type UHC    PT Start Time 0947    PT Stop Time 1337    PT Time Calculation (min) 38 min    Activity Tolerance Patient tolerated treatment well    Behavior During Therapy [facility] for tasks assessed/performed             Past Medical History:  Diagnosis Date   Arthritis    Asthma    hx of in childhood   Diabetes mellitus without complication ([place])    no tx   GERD (gastroesophageal reflux disease)    Gout    H/O urinary frequency    as noted in H&P per Dr Loanne Drilling 04/25/2014     Past Surgical History:  Procedure Laterality Date   COLONOSCOPY     EYE SURGERY     laser surgery bilat    JOINT REPLACEMENT     TOTAL KNEE ARTHROPLASTY Right 06/15/2014   Procedure: RIGHT TOTAL KNEE ARTHROPLASTY;  Surgeon: Mcarthur Rossetti, MD;  Location: [facility];  Service: Orthopedics;  Laterality: Right;   UPPER GI ENDOSCOPY      There were no vitals filed for this visit.   Subjective Assessment - 01/09/21 1303     Subjective Pt. indicated no complaints or pain upon arrival today.    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 No/denies    Pain Score 0-No pain    Pain Onset More than a month ago                [facility] PT Assessment - 01/09/21 0001       Assessment   Medical Diagnosis M54.42 acute left sided low back pain with left sciatica                           [facility] Adult PT  Treatment/Exercise - 01/09/21 0001       Lumbar Exercises: Aerobic   Nustep Lvl 7 10 mins UE/LE      Lumbar Exercises: Machines for Strengthening   Leg Press 100 lbs 3 x 15 double leg    Other Lumbar Machine Exercise cybex rows 25 lbs 3 x 15, lat pull down 15 lbs 3 x 15      Lumbar Exercises: Standing   Other Standing Lumbar Exercises box lift floor to waist 2 mins c cues for positioning and head up to help maintain neutral lumbar positioning                       PT Short Term Goals - 01/09/21 1340       PT SHORT TERM GOAL #1   Title Pt will be independent with initial HEP.    Time 3    Period Weeks    Status Achieved    Target Date 01/17/21  PT Long Term Goals - 12/23/20 1716       PT LONG TERM GOAL #1   Title Pt will be independent with advanced HEP    Time 6    Period Weeks    Status New    Target Date 02/07/21      PT LONG TERM GOAL #2   Title Pain will decreased pain to  </= 3/10 with all functional activities.    Time 6    Period Weeks    Status New    Target Date 02/07/21      PT LONG TERM GOAL #3   Title Pt will imrpove his 5 time sit to stand to </= 12 seconds with no UE support.    Baseline -    Time 6    Period Weeks    Status New    Target Date 02/07/21      PT LONG TERM GOAL #4   Title FOTO will improve from 53%  to 68%  indicating improved functional mobililty.    Baseline 53% on 12/23/2020    Time 6    Period Weeks    Status New    Target Date 02/07/21      PT LONG TERM GOAL #5   Title Pt will be albe to navigate a flight of stairs with single hand rail with back pain of </= 2/10.    Time 6    Period Weeks    Status New    Target Date 02/07/21                   Plan - 01/09/21 1334     Clinical Impression Statement Continued focus on progressive gross strengthening including box lift for work replication.  Cues required to improve procedures to reduce strain on back.  Pt. tolerated movement  without pain complaints and as able to replicate movement according to cues.    Personal Factors and Comorbidities Comorbidity 3+    Comorbidities arthritis, GERD, gout, asthma, obesity    Examination-Activity Limitations Lift;Other;Transfers;Squat;Stairs;Stand;Sit    Examination-Participation Restrictions Community Activity;Other;Occupation    Stability/Clinical Decision Making Stable/Uncomplicated    Rehab Potential Good    PT Frequency 2x / week    PT Duration 6 weeks    PT Treatment/Interventions ADLs/Self Care Home Management;Cryotherapy;Electrical Stimulation;Moist Heat;Iontophoresis 4mg /ml Dexamethasone;Gait training;Stair training;Therapeutic activities;Therapeutic exercise;Balance training;Neuromuscular re-education;Functional mobility training;Patient/family education;Traction;Ultrasound;Dry needling;Taping;Manual techniques;Passive range of motion;Spinal Manipulations;Joint Manipulations    PT Next Visit Plan Work related strengthening tasks.    PT Home Exercise Plan Access Code: [email or web address]    Consulted and Agree with Plan of Care Patient             Patient will benefit from skilled therapeutic intervention in order to improve the following deficits and impairments:  Pain, Decreased mobility, Decreased strength, Postural dysfunction, Decreased balance, Impaired flexibility, Decreased activity tolerance, Difficulty walking, Decreased range of motion  Visit Diagnosis: Muscle weakness (generalized)  Acute bilateral low back pain with left-sided sciatica  Difficulty in walking, not elsewhere classified     Problem List Patient Active Problem List   Diagnosis Date Noted   Cubital tunnel syndrome, left 12/26/2020   Unilateral primary osteoarthritis, left knee 02/28/2019   Osteoarthritis of right knee 06/15/2014   Status post total right knee replacement 06/15/2014   Sleep disorder 04/25/2014   Inguinal hernia 12/21/2013   Diabetes ([place]) 10/11/2013   Routine general  medical examination at a health care facility 10/10/2013   Screening for prostate cancer 10/10/2013  Gout 04/26/2008   RECTAL BLEEDING 04/26/2008   CHEST PAIN UNSPECIFIED 04/25/2007   HYPERCHOLESTEROLEMIA 01/26/2007   UNSPECIFIED CHRONIC ISCHEMIC HEART DISEASE 01/26/2007   CERVICAL RADICULOPATHY, RIGHT 01/26/2007   INSOMNIA 01/26/2007   Scot Jun, PT, DPT, OCS, ATC 01/09/21  1:42 PM    [facility] [address] [place], [place], [address] Phone: [phone]   Fax:  [phone]  Name: David Wall MRN: [id number] Date of Birth: 22-Jun-1955

## 2021-01-17 ENCOUNTER — Ambulatory Visit: Payer: Commercial Managed Care - PPO | Admitting: Family Medicine
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
## 2021-01-21 ENCOUNTER — Other Ambulatory Visit: Payer: Self-pay

## 2021-01-21 ENCOUNTER — Ambulatory Visit: Payer: Commercial Managed Care - PPO | Admitting: Family Medicine

## 2021-01-21 VITALS — BP 120/76 | HR 71 | Ht 68.0 in | Wt 262.0 lb

## 2021-01-21 DIAGNOSIS — R202 Paresthesia of skin: Secondary | ICD-10-CM

## 2021-01-21 DIAGNOSIS — G5622 Lesion of ulnar nerve, left upper limb: Secondary | ICD-10-CM | POA: Diagnosis not present

## 2021-01-21 DIAGNOSIS — S2242XD Multiple fractures of ribs, left side, subsequent encounter for fracture with routine healing: Secondary | ICD-10-CM | POA: Diagnosis not present

## 2021-01-21 NOTE — Patient Instructions (Addendum)
Thank you for coming in today. Recheck as needed.    

## 2021-01-22 ENCOUNTER — Ambulatory Visit: Payer: Commercial Managed Care - PPO | Admitting: Rehabilitative and Restorative Service Providers"

## 2021-01-22 ENCOUNTER — Encounter: Payer: Self-pay | Admitting: Rehabilitative and Restorative Service Providers"

## 2021-01-22 DIAGNOSIS — R262 Difficulty in walking, not elsewhere classified: Secondary | ICD-10-CM | POA: Diagnosis not present

## 2021-01-22 DIAGNOSIS — M5442 Lumbago with sciatica, left side: Secondary | ICD-10-CM

## 2021-01-22 DIAGNOSIS — M6281 Muscle weakness (generalized): Secondary | ICD-10-CM | POA: Diagnosis not present

## 2021-01-22 NOTE — Therapy (Signed)
[facility] [address] [place], [place], [address] Phone: [phone]   Fax:  [phone]  Physical Therapy Treatment /Discharge   Patient Details  Name: David Wall MRN: [id number] Date of Birth: 08-18-1955 Referring Provider (PT): Lynne Leader MD   Encounter Date: 01/22/2021  PHYSICAL THERAPY DISCHARGE SUMMARY  Visits from Start of Care: 5  Current functional level related to goals / functional outcomes: See note   Remaining deficits: See note   Education / Equipment: HEP   Patient agrees to discharge. Patient goals were met. Patient is being discharged due to meeting the stated rehab goals.    PT End of Session - 01/22/21 1256     Visit Number 5    Number of Visits 12    Date for PT Re-Evaluation 02/07/21    Authorization Type UHC    PT Start Time 8366    PT Stop Time 1320    PT Time Calculation (min) 23 min    Activity Tolerance Patient tolerated treatment well    Behavior During Therapy WFL for tasks assessed/performed             Past Medical History:  Diagnosis Date   Arthritis    Asthma    hx of in childhood   Diabetes mellitus without complication ([facility])    no tx   GERD (gastroesophageal reflux disease)    Gout    H/O urinary frequency    as noted in H&P per Dr Loanne Drilling 04/25/2014     Past Surgical History:  Procedure Laterality Date   COLONOSCOPY     EYE SURGERY     laser surgery bilat    JOINT REPLACEMENT     TOTAL KNEE ARTHROPLASTY Right 06/15/2014   Procedure: RIGHT TOTAL KNEE ARTHROPLASTY;  Surgeon: Mcarthur Rossetti, MD;  Location: [facility];  Service: Orthopedics;  Laterality: Right;   UPPER GI ENDOSCOPY      There were no vitals filed for this visit.   Subjective Assessment - 01/22/21 1256     Subjective Pt. recently had followup with MD with good overall indications noted in visit report.  Pt. has returned to work in time since last visit to therapy.  Global rating of change +7 a very  great deal better.    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 No/denies    Pain Score 0-No pain    Pain Onset More than a month ago                [facility] PT Assessment - 01/22/21 0001       Assessment   Medical Diagnosis M54.42 acute left sided low back pain with left sciatica    Referring Provider (PT) Lynne Leader MD      Observation/Other Assessments   Focus on Therapeutic Outcomes (FOTO)  77% update      Transfers   Five time sit to stand comments  10 seconds                           [facility] Adult PT Treatment/Exercise - 01/22/21 0001       Lumbar Exercises: Aerobic   Nustep Lvl 7 12 mins UE/LE      Lumbar Exercises: Machines for Strengthening   Leg Press 125 lbs 3 x 15 double leg  Other Lumbar Machine Exercise cybex rows 35 lbs 3 x 15, lat pull down 20 lbs 3 x 15                       PT Short Term Goals - 01/09/21 1340       PT SHORT TERM GOAL #1   Title Pt will be independent with initial HEP.    Time 3    Period Weeks    Status Achieved    Target Date 01/17/21               PT Long Term Goals - 01/22/21 1312       PT LONG TERM GOAL #1   Title Pt will be independent with advanced HEP    Time 6    Period Weeks    Status Achieved    Target Date 02/07/21      PT LONG TERM GOAL #2   Title Pain will decreased pain to  </= 3/10 with all functional activities.    Time 6    Period Weeks    Status Achieved    Target Date 02/07/21      PT LONG TERM GOAL #3   Title Pt will imrpove his 5 time sit to stand to </= 12 seconds with no UE support.    Baseline -    Time 6    Period Weeks    Status Achieved    Target Date 02/07/21      PT LONG TERM GOAL #4   Title FOTO will improve from 53%  to 68%  indicating improved functional mobililty.    Baseline 53% on 12/23/2020    Time 6     Period Weeks    Status Achieved    Target Date 02/07/21      PT LONG TERM GOAL #5   Title Pt will be albe to navigate a flight of stairs with single hand rail with back pain of </= 2/10.    Time 6    Period Weeks    Status Achieved    Target Date 02/07/21                   Plan - 01/22/21 1311     Clinical Impression Statement Pt. has demonstrated good improvement to this point, reporting global rating of change +7 at this time.  FOTO score improved greatly compared to evaluation.  Current presentation in line with recommendation to d/c to HEP due to overall improvements.  Pt. in agreement with plan.    Personal Factors and Comorbidities Comorbidity 3+    Comorbidities arthritis, GERD, gout, asthma, obesity    Examination-Activity Limitations Lift;Other;Transfers;Squat;Stairs;Stand;Sit    Examination-Participation Restrictions Community Activity;Other;Occupation    Stability/Clinical Decision Making Stable/Uncomplicated    Rehab Potential Good    PT Frequency 2x / week    PT Duration 6 weeks    PT Treatment/Interventions ADLs/Self Care Home Management;Cryotherapy;Electrical Stimulation;Moist Heat;Iontophoresis 108m/ml Dexamethasone;Gait tScientist, forensicTherapeutic activities;Therapeutic exercise;Balance training;Neuromuscular re-education;Functional mobility training;Patient/family education;Traction;Ultrasound;Dry needling;Taping;Manual techniques;Passive range of motion;Spinal Manipulations;Joint Manipulations    PT Next Visit Plan D/C to HEP    PT Home Exercise Plan Access Code: [email or web address]    Consulted and Agree with Plan of Care Patient             Patient will benefit from skilled therapeutic intervention in order to improve the following deficits and impairments:  Pain, Decreased mobility, Decreased strength, Postural dysfunction, Decreased balance, Impaired flexibility, Decreased activity  tolerance, Difficulty walking, Decreased range of motion  Visit  Diagnosis: Muscle weakness (generalized)  Acute bilateral low back pain with left-sided sciatica  Difficulty in walking, not elsewhere classified     Problem List Patient Active Problem List   Diagnosis Date Noted   Cubital tunnel syndrome, left 12/26/2020   Unilateral primary osteoarthritis, left knee 02/28/2019   Osteoarthritis of right knee 06/15/2014   Status post total right knee replacement 06/15/2014   Sleep disorder 04/25/2014   Inguinal hernia 12/21/2013   Diabetes ([place]) 10/11/2013   Routine general medical examination at a health care facility 10/10/2013   Screening for prostate cancer 10/10/2013   Gout 04/26/2008   RECTAL BLEEDING 04/26/2008   CHEST PAIN UNSPECIFIED 04/25/2007   HYPERCHOLESTEROLEMIA 01/26/2007   UNSPECIFIED CHRONIC ISCHEMIC HEART DISEASE 01/26/2007   CERVICAL RADICULOPATHY, RIGHT 01/26/2007   INSOMNIA 01/26/2007    Scot Jun, PT, DPT, OCS, ATC 01/22/21  1:20 PM    [facility] [address] [place], [place], [address] Phone: [phone]   Fax:  [phone]  Name: CASPAR FAVILA MRN: [id number] Date of Birth: [birth year]

## 2021-01-24 ENCOUNTER — Encounter: Payer: Commercial Managed Care - PPO | Admitting: Rehabilitative and Restorative Service Providers"

## 2021-01-28 ENCOUNTER — Encounter: Payer: Commercial Managed Care - PPO | Admitting: Rehabilitative and Restorative Service Providers"

## 2021-01-30 ENCOUNTER — Encounter: Payer: Commercial Managed Care - PPO | Admitting: Rehabilitative and Restorative Service Providers"

## 2021-02-04 ENCOUNTER — Encounter: Payer: Commercial Managed Care - PPO | Admitting: Rehabilitative and Restorative Service Providers"

## 2021-02-06 ENCOUNTER — Encounter: Payer: Commercial Managed Care - PPO | Admitting: Rehabilitative and Restorative Service Providers"

## 2021-02-19 ENCOUNTER — Telehealth: Payer: Self-pay

## 2021-02-19 NOTE — Telephone Encounter (Signed)
Pt called stating PCP had once prescribed a cream for his hands that prevented cracking.  He needs a refill on it and he cannot remember the name of it.  Pharmacy is [facility] on [address].

## 2021-02-20 NOTE — Telephone Encounter (Signed)
Called pt and he stated that hand are cracking due to weather. He is requesting some hand cream cream. I have informed him we have not seen for this issue recently.   He stated understanding and is scheduled to be seen tomorrow.

## 2021-02-21 ENCOUNTER — Telehealth: Payer: Self-pay | Admitting: Family

## 2021-02-21 ENCOUNTER — Ambulatory Visit: Payer: Commercial Managed Care - PPO | Admitting: Family

## 2021-02-21 ENCOUNTER — Other Ambulatory Visit: Payer: Self-pay | Admitting: Family

## 2021-02-21 DIAGNOSIS — E119 Type 2 diabetes mellitus without complications: Secondary | ICD-10-CM

## 2021-02-21 MED ORDER — NYSTATIN-TRIAMCINOLONE 100000-0.1 UNIT/GM-% EX CREA: 1.0000 "application " | TOPICAL_CREAM | Freq: Two times a day (BID) | CUTANEOUS | 0 refills | Status: DC

## 2021-02-21 NOTE — Telephone Encounter (Signed)
I spoke to patient regarding his request for refill on Mycolog II that he has used in the past; agree that we can refill the medication and cancel appointment scheduled for today. He will plan to follow up in office if symptoms persist. However, he has responded well to this medication in the past. He also agrees to go to [facility] lab next Monday for updated labs to follow up on his diabetes. Will follow up once lab results are obtained.

## 2021-04-18 ENCOUNTER — Other Ambulatory Visit (INDEPENDENT_AMBULATORY_CARE_PROVIDER_SITE_OTHER): Payer: Commercial Managed Care - PPO

## 2021-04-18 DIAGNOSIS — E119 Type 2 diabetes mellitus without complications: Secondary | ICD-10-CM

## 2021-04-18 LAB — LIPID PANEL
Cholesterol: 78 mg/dL (ref 0–200)
HDL: 17.3 mg/dL — ABNORMAL LOW (ref >39.00)
LDL Cholesterol: 23 mg/dL (ref 0–99)
NonHDL: 60.75
Total CHOL/HDL Ratio: 5
Triglycerides: 190 mg/dL — ABNORMAL HIGH (ref 0.0–149.0)
VLDL: 38 mg/dL (ref 0.0–40.0)

## 2021-04-18 LAB — COMPREHENSIVE METABOLIC PANEL
ALT: 43 U/L (ref 0–53)
AST: 31 U/L (ref 0–37)
Albumin: 4.2 g/dL (ref 3.5–5.2)
Alkaline Phosphatase: 59 U/L (ref 39–117)
BUN: 12 mg/dL (ref 6–23)
CO2: 25 mEq/L (ref 19–32)
Calcium: 9.5 mg/dL (ref 8.4–10.5)
Chloride: 106 mEq/L (ref 96–112)
Creatinine, Ser: 0.89 mg/dL (ref 0.40–1.50)
GFR: 90.97 mL/min (ref >60.00)
Glucose, Bld: 99 mg/dL (ref 70–99)
Potassium: 3.9 mEq/L (ref 3.5–5.1)
Sodium: 138 mEq/L (ref 135–145)
Total Bilirubin: 0.2 mg/dL (ref 0.2–1.2)
Total Protein: 6.8 g/dL (ref 6.0–8.3)

## 2021-04-18 LAB — CBC WITH DIFFERENTIAL/PLATELET
Basophils Absolute: 0.1 10*3/uL (ref 0.0–0.1)
Basophils Relative: 1.5 % (ref 0.0–3.0)
Eosinophils Absolute: 0.3 10*3/uL (ref 0.0–0.7)
Eosinophils Relative: 3.8 % (ref 0.0–5.0)
HCT: 37.4 % — ABNORMAL LOW (ref 39.0–52.0)
Hemoglobin: 12.5 g/dL — ABNORMAL LOW (ref 13.0–17.0)
Lymphocytes Relative: 24.4 % (ref 12.0–46.0)
Lymphs Abs: 1.8 10*3/uL (ref 0.7–4.0)
MCHC: 33.5 g/dL (ref 30.0–36.0)
MCV: 87.3 fl (ref 78.0–100.0)
Monocytes Absolute: 0.4 10*3/uL (ref 0.1–1.0)
Monocytes Relative: 5.2 % (ref 3.0–12.0)
Neutro Abs: 4.9 10*3/uL (ref 1.4–7.7)
Neutrophils Relative %: 65.1 % (ref 43.0–77.0)
Platelets: 274 10*3/uL (ref 150.0–400.0)
RBC: 4.28 Mil/uL (ref 4.22–5.81)
RDW: 13.2 % (ref 11.5–15.5)
WBC: 7.5 10*3/uL (ref 4.0–10.5)

## 2021-04-18 LAB — HEMOGLOBIN A1C: Hgb A1c MFr Bld: 6.6 % — ABNORMAL HIGH (ref 4.6–6.5)

## 2021-05-05 ENCOUNTER — Telehealth: Payer: Self-pay | Admitting: Family

## 2021-05-05 NOTE — Telephone Encounter (Signed)
We discussed that he would need a repeat chest CT in 4-6 months to follow up on pulmonary nodule. Is he okay with me getting this set up for him?  ?

## 2021-05-05 NOTE — Telephone Encounter (Signed)
-----   Message from Marrian Salvage, [place] sent at 10/29/2020  3:15 PM EDT ----- ?Needs repeat chest CT- pulmonary nodule ? ?

## 2021-05-05 NOTE — Telephone Encounter (Signed)
Patient is ok with you getting that set up for him. ?

## 2021-05-06 ENCOUNTER — Other Ambulatory Visit: Payer: Self-pay | Admitting: Family

## 2021-05-06 DIAGNOSIS — R911 Solitary pulmonary nodule: Secondary | ICD-10-CM

## 2021-05-18 ENCOUNTER — Other Ambulatory Visit: Payer: Self-pay | Admitting: Family

## 2021-06-05 ENCOUNTER — Ambulatory Visit
Admission: RE | Admit: 2021-06-05 | Discharge: 2021-06-05 | Disposition: A | Discharge status: Home / Self Care | Payer: Commercial Managed Care - PPO | Source: Ambulatory Visit | Attending: Family | Admitting: Family

## 2021-06-05 DIAGNOSIS — R911 Solitary pulmonary nodule: Secondary | ICD-10-CM

## 2021-06-05 LAB — HM DIABETES EYE EXAM

## 2021-06-09 ENCOUNTER — Other Ambulatory Visit: Payer: Self-pay | Admitting: Family

## 2021-06-09 DIAGNOSIS — R911 Solitary pulmonary nodule: Secondary | ICD-10-CM

## 2021-06-11 ENCOUNTER — Telehealth: Payer: Self-pay | Admitting: Orthopaedic Surgery

## 2021-06-11 NOTE — Telephone Encounter (Signed)
Refaxed to [facility] advising No records within requested dates, originally faxed 05/22/21.

## 2021-06-24 ENCOUNTER — Ambulatory Visit: Payer: Commercial Managed Care - PPO | Admitting: Family

## 2021-06-25 ENCOUNTER — Encounter (HOSPITAL_COMMUNITY): Payer: Self-pay | Admitting: Emergency Medicine

## 2021-06-25 ENCOUNTER — Ambulatory Visit (HOSPITAL_COMMUNITY)
Admission: EM | Admit: 2021-06-25 | Discharge: 2021-06-25 | Disposition: A | Discharge status: Home / Self Care | Payer: Commercial Managed Care - PPO | Attending: Emergency Medicine | Admitting: Emergency Medicine

## 2021-06-25 DIAGNOSIS — J069 Acute upper respiratory infection, unspecified: Secondary | ICD-10-CM | POA: Diagnosis not present

## 2021-06-25 MED ORDER — DM-GUAIFENESIN ER 30-600 MG PO TB12: 1.0000 | ORAL_TABLET | Freq: Two times a day (BID) | ORAL | 0 refills | Status: DC

## 2021-06-25 MED ORDER — BENZONATATE 100 MG PO CAPS: 100.0000 mg | ORAL_CAPSULE | Freq: Two times a day (BID) | ORAL | 0 refills | Status: DC | PRN

## 2021-06-25 NOTE — ED Triage Notes (Signed)
Pt is present today with c/o right ear pain and cough. Pt sx started 2 days ago

## 2021-06-25 NOTE — ED Provider Notes (Signed)
[place]    CSN: [id number] Arrival date & time: 06/25/21  0804     History   Chief Complaint Chief Complaint  Patient presents with   Cough   Otalgia    HPI David Wall is a 66 y.o. male.  Presents with right ear pain and cough for 2 days.  Patient states cough is productive of clear mucus.  He has tried Robitussin without relief of symptoms.  He has taken Tylenol for the ear pain which has helped.  Denies fever, chills, congestion, chest pain, shortness of breath or trouble breathing, abdominal pain, vomiting/diarrhea. No history of asthma or COPD.  He is a non-smoker.  Past Medical History:  Diagnosis Date   Arthritis    Asthma    hx of in childhood   Diabetes mellitus without complication ([place])    no tx   GERD (gastroesophageal reflux disease)    Gout    H/O urinary frequency    as noted in H&P per Dr Loanne Drilling 04/25/2014     Patient Active Problem List   Diagnosis Date Noted   Cubital tunnel syndrome, left 12/26/2020   Unilateral primary osteoarthritis, left knee 02/28/2019   Osteoarthritis of right knee 06/15/2014   Status post total right knee replacement 06/15/2014   Sleep disorder 04/25/2014   Inguinal hernia 12/21/2013   Diabetes ([place]) 10/11/2013   Routine general medical examination at a health care facility 10/10/2013   Screening for prostate cancer 10/10/2013   Gout 04/26/2008   RECTAL BLEEDING 04/26/2008   CHEST PAIN UNSPECIFIED 04/25/2007   HYPERCHOLESTEROLEMIA 01/26/2007   UNSPECIFIED CHRONIC ISCHEMIC HEART DISEASE 01/26/2007   CERVICAL RADICULOPATHY, RIGHT 01/26/2007   INSOMNIA 01/26/2007    Past Surgical History:  Procedure Laterality Date   COLONOSCOPY     EYE SURGERY     laser surgery bilat    JOINT REPLACEMENT     TOTAL KNEE ARTHROPLASTY Right 06/15/2014   Procedure: RIGHT TOTAL KNEE ARTHROPLASTY;  Surgeon: Mcarthur Rossetti, MD;  Location: [facility];  Service: Orthopedics;  Laterality: Right;   UPPER GI ENDOSCOPY        Home Medications    Prior to Admission medications   Medication Sig Start Date End Date Taking? Authorizing Provider  benzonatate (TESSALON) 100 MG capsule Take 1 capsule (100 mg total) by mouth 2 (two) times daily as needed for cough. 06/25/21  Yes , Wells Guiles, PA-C  dextromethorphan-guaiFENesin (MUCINEX DM) 30-600 MG 12hr tablet Take 1 tablet by mouth 2 (two) times daily. 06/25/21  Yes , Wells Guiles, PA-C  lidocaine (LIDODERM) 5 % Apply one patch to affected area for 12 hours and then remove for 12 hours; 10/29/20   Marrian Salvage, FNP  naproxen (NAPROSYN) 500 MG tablet Take 1 tablet (500 mg total) by mouth 2 (two) times daily with a meal. 10/29/20   Marrian Salvage, FNP  nystatin-triamcinolone (MYCOLOG II) cream Apply 1 application topically 2 (two) times daily. 02/21/21   Marrian Salvage, FNP  omeprazole (PRILOSEC) 20 MG capsule Take 1 capsule (20 mg total) by mouth 2 (two) times daily before a meal. 09/19/20   Marrian Salvage, FNP  probenecid (BENEMID) 500 MG tablet Take 1 tablet (500 mg total) by mouth 2 (two) times daily. 09/19/20   Marrian Salvage, FNP  rosuvastatin (CRESTOR) 10 MG tablet TAKE 1 TABLET BY MOUTH EVERY DAY 05/19/21   Marrian Salvage, FNP  sildenafil (VIAGRA) 100 MG tablet Take 0.5-1 tablets (50-100 mg total) by mouth daily  as needed for erectile dysfunction. 07/26/20   Marrian Salvage, FNP  tamsulosin (FLOMAX) 0.4 MG CAPS capsule Take 1 capsule (0.4 mg total) by mouth daily. 09/19/20   Marrian Salvage, FNP    Family History Family History  Problem Relation Age of Onset   Healthy Mother    Healthy Father    Colon cancer Neg Hx    Esophageal cancer Neg Hx    Stomach cancer Neg Hx    Rectal cancer Neg Hx     Social History Social History   Tobacco Use   Smoking status: Never   Smokeless tobacco: Never  Substance Use Topics   Alcohol use: Yes    Comment: occasional; 1-2 drinks per week per pt   Drug use: No      Allergies   Amoxicillin, Aspirin, Penicillins, and Allopurinol   Review of Systems Review of Systems  HENT:  Positive for ear pain.   Respiratory:  Positive for cough.    Per HPI  Physical Exam Triage Vital Signs ED Triage Vitals  Enc Vitals Group     BP 06/25/21 0825 138/77     Pulse Rate 06/25/21 0825 81     Resp 06/25/21 0825 18     Temp 06/25/21 0825 98.7 F (37.1 C)     Temp src --      SpO2 06/25/21 0825 95 %     Weight --      Height --      Head Circumference --      Peak Flow --      Pain Score 06/25/21 0824 7     Pain Loc --      Pain Edu? --      Excl. in [place]? --    No data found.  Updated Vital Signs BP 138/77   Pulse 81   Temp 98.7 F (37.1 C)   Resp 18   SpO2 95%    Physical Exam Vitals and nursing note reviewed.  Constitutional:      General: He is not in acute distress. HENT:     Right Ear: Tympanic membrane and ear canal normal.     Left Ear: Tympanic membrane and ear canal normal.     Ears:     Comments: Small amount of clear fluid behind right tympanic membrane    Nose: Congestion present. No rhinorrhea.     Comments: Boggy nasal turbinates    Mouth/Throat:     Mouth: Mucous membranes are moist.     Pharynx: Uvula midline. No posterior oropharyngeal erythema.     Tonsils: No tonsillar exudate or tonsillar abscesses.  Eyes:     Conjunctiva/sclera: Conjunctivae normal.     Pupils: Pupils are equal, round, and reactive to light.  Cardiovascular:     Rate and Rhythm: Normal rate and regular rhythm.     Heart sounds: Normal heart sounds.  Pulmonary:     Effort: Pulmonary effort is normal. No respiratory distress.     Breath sounds: Normal breath sounds. No wheezing.  Abdominal:     General: Bowel sounds are normal.     Palpations: Abdomen is soft.     Tenderness: There is no abdominal tenderness.  Musculoskeletal:     Cervical back: Normal range of motion.  Lymphadenopathy:     Cervical: No cervical adenopathy.   Neurological:     Mental Status: He is alert and oriented to person, place, and time.     UC Treatments / Results  Labs (all labs ordered are listed, but only abnormal results are displayed) Labs Reviewed - No data to display  EKG  Radiology No results found.  Procedures Procedures (including critical care time)  Medications Ordered in UC Medications - No data to display  Initial Impression / Assessment and Plan / UC Course  I have reviewed the triage vital signs and the nursing notes.  Pertinent labs & imaging results that were available during my care of the patient were reviewed by me and considered in my medical decision making (see chart for details).  Patient does appear congested on exam.  I believe he may have upper respiratory infection versus allergies.  He has taken cough medicine without much relief but has not tried any decongestions.  At this time I recommend that he take a daily decongestant such as Mucinex.  He can try benzonatate cough medicine twice a day as needed for cough.  I discussed with patient that the decongestant may also help with his ear pain/pressure.  He does have a primary care provider that he can follow-up with.  Patient agrees to plan, we discussed return precautions.  He is discharged in stable condition.  Final Clinical Impressions(s) / UC Diagnoses   Final diagnoses:  Viral upper respiratory tract infection     Discharge Instructions      Please take medication as prescribed, as needed.  Follow-up with your primary care provider if symptoms do not improve.  If your symptoms worsen, please go to the emergency department.    ED Prescriptions     Medication Sig Dispense Auth. Provider   benzonatate (TESSALON) 100 MG capsule Take 1 capsule (100 mg total) by mouth 2 (two) times daily as needed for cough. 21 capsule , , PA-C   dextromethorphan-guaiFENesin (MUCINEX DM) 30-600 MG 12hr tablet Take 1 tablet by mouth 2 (two)  times daily. 30 tablet , Wells Guiles, PA-C      PDMP not reviewed this encounter.   , Wells Guiles, [place] 06/25/21 [phone]

## 2021-06-25 NOTE — Discharge Instructions (Addendum)
Please take medication as prescribed, as needed.  Follow-up with your primary care provider if symptoms do not improve.  If your symptoms worsen, please go to the emergency department.

## 2021-06-26 ENCOUNTER — Telehealth: Payer: Self-pay | Admitting: Family

## 2021-06-26 ENCOUNTER — Other Ambulatory Visit: Payer: Self-pay | Admitting: Family

## 2021-06-26 MED ORDER — COLCHICINE 0.6 MG PO TABS: ORAL_TABLET | ORAL | 0 refills | Status: DC

## 2021-06-26 NOTE — Telephone Encounter (Signed)
Patient stated that he has been taking the probenecid daily.  He would like colchicine sent in.  He did have a follow up on Tuesday but it was canceled so I rescheduled appt for tomorrow.

## 2021-06-26 NOTE — Telephone Encounter (Signed)
Pt states he's having a gout flare up and would like new rx prescribed

## 2021-06-26 NOTE — Telephone Encounter (Signed)
Pt has history of increase uric acid.  Last uric acid level was 9.1 on 11/05/20.  Would you like for [place] to get him in?

## 2021-06-27 ENCOUNTER — Encounter: Payer: Self-pay | Admitting: Family

## 2021-06-27 ENCOUNTER — Other Ambulatory Visit: Payer: Self-pay | Admitting: Family

## 2021-06-27 ENCOUNTER — Ambulatory Visit (INDEPENDENT_AMBULATORY_CARE_PROVIDER_SITE_OTHER): Payer: Commercial Managed Care - PPO | Admitting: Family

## 2021-06-27 VITALS — BP 120/66 | HR 65 | Temp 97.8°F | Resp 18 | Ht 68.0 in | Wt 261.5 lb

## 2021-06-27 DIAGNOSIS — H6691 Otitis media, unspecified, right ear: Secondary | ICD-10-CM | POA: Diagnosis not present

## 2021-06-27 DIAGNOSIS — M1A9XX0 Chronic gout, unspecified, without tophus (tophi): Secondary | ICD-10-CM

## 2021-06-27 DIAGNOSIS — E119 Type 2 diabetes mellitus without complications: Secondary | ICD-10-CM | POA: Diagnosis not present

## 2021-06-27 DIAGNOSIS — E785 Hyperlipidemia, unspecified: Secondary | ICD-10-CM | POA: Diagnosis not present

## 2021-06-27 MED ORDER — HYDROCODONE-ACETAMINOPHEN 5-325 MG PO TABS: 1.0000 | ORAL_TABLET | Freq: Four times a day (QID) | ORAL | 0 refills | Status: DC | PRN

## 2021-06-27 MED ORDER — AZITHROMYCIN 250 MG PO TABS
ORAL_TABLET | ORAL | 0 refills | Status: DC
Start: 2021-06-27 — End: 2021-08-01

## 2021-06-27 MED ORDER — METHYLPREDNISOLONE ACETATE 40 MG/ML IJ SUSP
40.0000 mg | Freq: Once | INTRAMUSCULAR | Status: AC
  Administered 2021-06-27: 40 mg via INTRAMUSCULAR

## 2021-06-27 MED ORDER — PREDNISONE 20 MG PO TABS: 20.0000 mg | ORAL_TABLET | Freq: Every day | ORAL | 0 refills | Status: DC

## 2021-06-27 NOTE — Progress Notes (Signed)
David Wall is a 66 y.o. male with the following history as recorded in EpicCare:  Patient Active Problem List   Diagnosis Date Noted   Cubital tunnel syndrome, left 12/26/2020   Unilateral primary osteoarthritis, left knee 02/28/2019   Osteoarthritis of right knee 06/15/2014   Status post total right knee replacement 06/15/2014   Sleep disorder 04/25/2014   Inguinal hernia 12/21/2013   Diabetes ([place]) 10/11/2013   Routine general medical examination at a health care facility 10/10/2013   Screening for prostate cancer 10/10/2013   Gout 04/26/2008   RECTAL BLEEDING 04/26/2008   CHEST PAIN UNSPECIFIED 04/25/2007   HYPERCHOLESTEROLEMIA 01/26/2007   UNSPECIFIED CHRONIC ISCHEMIC HEART DISEASE 01/26/2007   CERVICAL RADICULOPATHY, RIGHT 01/26/2007   INSOMNIA 01/26/2007    Current Outpatient Medications  Medication Sig Dispense Refill   azithromycin (ZITHROMAX) 250 MG tablet 2 tabs po qd x 1 day; 1 tablet per day x 4 days; 6 tablet 0   benzonatate (TESSALON) 100 MG capsule Take 1 capsule (100 mg total) by mouth 2 (two) times daily as needed for cough. 21 capsule 0   dextromethorphan-guaiFENesin (MUCINEX DM) 30-600 MG 12hr tablet Take 1 tablet by mouth 2 (two) times daily. 30 tablet 0   HYDROcodone-acetaminophen (NORCO) 5-325 MG tablet Take 1 tablet by mouth every 6 (six) hours as needed for moderate pain. 20 tablet 0   lidocaine (LIDODERM) 5 % Apply one patch to affected area for 12 hours and then remove for 12 hours; 30 patch 0   naproxen (NAPROSYN) 500 MG tablet Take 1 tablet (500 mg total) by mouth 2 (two) times daily with a meal. 60 tablet 2   nystatin-triamcinolone (MYCOLOG II) cream Apply 1 application topically 2 (two) times daily. 30 g 0   omeprazole (PRILOSEC) 20 MG capsule Take 1 capsule (20 mg total) by mouth 2 (two) times daily before a meal. 180 capsule 3   predniSONE (DELTASONE) 20 MG tablet Take 1 tablet (20 mg total) by mouth daily with breakfast. 5 tablet 0   probenecid  (BENEMID) 500 MG tablet Take 1 tablet (500 mg total) by mouth 2 (two) times daily. 180 tablet 1   rosuvastatin (CRESTOR) 10 MG tablet TAKE 1 TABLET BY MOUTH EVERY DAY 90 tablet 1   sildenafil (VIAGRA) 100 MG tablet Take 0.5-1 tablets (50-100 mg total) by mouth daily as needed for erectile dysfunction. 10 tablet 1   tamsulosin (FLOMAX) 0.4 MG CAPS capsule Take 1 capsule (0.4 mg total) by mouth daily. 90 capsule 1   No current facility-administered medications for this visit.    Allergies: Amoxicillin, Aspirin, Penicillins, and Allopurinol  Past Medical History:  Diagnosis Date   Arthritis    Asthma    hx of in childhood   Diabetes mellitus without complication ([facility])    no tx   GERD (gastroesophageal reflux disease)    Gout    H/O urinary frequency    as noted in H&P per Dr Loanne Drilling 04/25/2014     Past Surgical History:  Procedure Laterality Date   COLONOSCOPY     EYE SURGERY     laser surgery bilat    JOINT REPLACEMENT     TOTAL KNEE ARTHROPLASTY Right 06/15/2014   Procedure: RIGHT TOTAL KNEE ARTHROPLASTY;  Surgeon: Mcarthur Rossetti, MD;  Location: [facility];  Service: Orthopedics;  Laterality: Right;   UPPER GI ENDOSCOPY      Family History  Problem Relation Age of Onset   Healthy Mother    Healthy Father  Colon cancer Neg Hx    Esophageal cancer Neg Hx    Stomach cancer Neg Hx    Rectal cancer Neg Hx     Social History   Tobacco Use   Smoking status: Never   Smokeless tobacco: Never  Substance Use Topics   Alcohol use: Yes    Comment: occasional; 1-2 drinks per week per pt    Subjective:   Patient had called earlier this week with complaint of gout flare; he does take Probenecid daily and Colchicine was called in to help with flare; unfortunately, his symptoms have persisted/ worsened and the flare is in both feet; painful to walk;   Was seen at [facility] earlier this week with URI as well- wonders if Robitussin may have triggered gout flare; was diagnosed with viral  URI but is concerned that right ear especially continues to feel full/ clogged;     Objective:  Vitals:   06/27/21 1350  BP: 120/66  Pulse: 65  Resp: 18  Temp: 97.8 F (36.6 C)  TempSrc: Oral  SpO2: 96%  Weight: 261 lb 8 oz (118.6 kg)  Height: 5' 8"  (1.727 m)    General: Well developed, well nourished, in no acute distress  Skin : Warm and dry.  Head: Normocephalic and atraumatic  Eyes: Sclera and conjunctiva clear; pupils round and reactive to light; extraocular movements intact  Ears: External normal; canals clear; right tympanic membranes erythematous Oropharynx: Pink, supple. No suspicious lesions  Neck: Supple without thyromegaly, adenopathy  Lungs: Respirations unlabored;  Musculoskeletal: No deformities; swelling noted across top of right foot Extremities: No edema, cyanosis, clubbing  Vessels: Symmetric bilaterally  Neurologic: Alert and oriented; speech intact; face symmetrical; shuffled gait due to pain; CNII-XII intact without focal deficit   Assessment:  1. Chronic gout without tophus, unspecified cause, unspecified site   2. Hyperlipidemia, unspecified hyperlipidemia type   3. Type 2 diabetes mellitus without complication, without long-term current use of insulin ([place])   4. Right otitis media, unspecified otitis media type     Plan:  Depo-Medrol IM 40 mg; start oral prednisone tomorrow x 5 days; Rx for Norco for pain relief; discussed re-starting tart cherry supplement; plan to re-check uric acid level in about 1 month; Order updated to check lipids in 1 month; Order updated to check Hgba1c in 1 month; Rx for Z-pak;   No follow-ups on file.  Orders Placed This Encounter  Procedures   Comp Met (CMET)    Standing Status:   Future    Standing Expiration Date:   06/28/2022   Lipid panel    Standing Status:   Future    Standing Expiration Date:   06/28/2022   Hemoglobin A1c    Standing Status:   Future    Standing Expiration Date:   06/28/2022   Uric acid     Standing Status:   Future    Standing Expiration Date:   06/28/2022    Requested Prescriptions   Signed Prescriptions Disp Refills   azithromycin (ZITHROMAX) 250 MG tablet 6 tablet 0    Sig: 2 tabs po qd x 1 day; 1 tablet per day x 4 days;   HYDROcodone-acetaminophen (NORCO) 5-325 MG tablet 20 tablet 0    Sig: Take 1 tablet by mouth every 6 (six) hours as needed for moderate pain.   predniSONE (DELTASONE) 20 MG tablet 5 tablet 0    Sig: Take 1 tablet (20 mg total) by mouth daily with breakfast.

## 2021-06-27 NOTE — Patient Instructions (Addendum)
Per our records you are due for your diabetic eye exam. Please contact your eye doctor to schedule an appointment. Please have them send copies of your office visit notes to [place]. Our fax number is [phone]. If you need a referral to an eye doctor please let us know.   No more Colchicine;  Start Zithromax today for ear infection; start oral prednisone tomorrow; Get labs at [facility] by July 2023;

## 2021-07-03 ENCOUNTER — Encounter: Payer: Self-pay | Admitting: Family

## 2021-07-03 ENCOUNTER — Institutional Professional Consult (permissible substitution): Payer: Commercial Managed Care - PPO | Admitting: Emergency Medicine

## 2021-07-07 ENCOUNTER — Telehealth: Payer: Self-pay | Admitting: Family

## 2021-07-07 NOTE — Telephone Encounter (Signed)
Called patient back and left message on machine.  I am sorry I am not is well versed in his current symptoms.  Please let us know how we could help-we will be glad to see him in the office this week, or can make a referral to ENT.  Please give [place] a call back as needed

## 2021-07-07 NOTE — Telephone Encounter (Signed)
Pt called stating that the meds for his eardrums are not helping and he still has the ringing in the right ear. Pt would like guidance on what to do next to remedy this issue.
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
## 2021-07-30 ENCOUNTER — Other Ambulatory Visit (INDEPENDENT_AMBULATORY_CARE_PROVIDER_SITE_OTHER): Payer: Commercial Managed Care - PPO

## 2021-07-30 ENCOUNTER — Ambulatory Visit: Payer: Self-pay

## 2021-07-30 ENCOUNTER — Ambulatory Visit: Payer: Commercial Managed Care - PPO | Admitting: Family Medicine

## 2021-07-30 VITALS — BP 120/80 | HR 84 | Ht 68.0 in | Wt 268.0 lb

## 2021-07-30 DIAGNOSIS — E119 Type 2 diabetes mellitus without complications: Secondary | ICD-10-CM

## 2021-07-30 DIAGNOSIS — M25522 Pain in left elbow: Secondary | ICD-10-CM

## 2021-07-30 DIAGNOSIS — M7032 Other bursitis of elbow, left elbow: Secondary | ICD-10-CM

## 2021-07-30 DIAGNOSIS — M1A9XX0 Chronic gout, unspecified, without tophus (tophi): Secondary | ICD-10-CM

## 2021-07-30 DIAGNOSIS — E785 Hyperlipidemia, unspecified: Secondary | ICD-10-CM

## 2021-07-30 DIAGNOSIS — M7022 Olecranon bursitis, left elbow: Secondary | ICD-10-CM | POA: Insufficient documentation

## 2021-07-30 LAB — LIPID PANEL
Cholesterol: 152 mg/dL (ref 0–200)
HDL: 26.4 mg/dL — ABNORMAL LOW (ref >39.00)
NonHDL: 125.62
Total CHOL/HDL Ratio: 6
Triglycerides: 387 mg/dL — ABNORMAL HIGH (ref 0.0–149.0)
VLDL: 77.4 mg/dL — ABNORMAL HIGH (ref 0.0–40.0)

## 2021-07-30 LAB — COMPREHENSIVE METABOLIC PANEL
ALT: 21 U/L (ref 0–53)
AST: 18 U/L (ref 0–37)
Albumin: 4.3 g/dL (ref 3.5–5.2)
Alkaline Phosphatase: 56 U/L (ref 39–117)
BUN: 21 mg/dL (ref 6–23)
CO2: 23 mEq/L (ref 19–32)
Calcium: 9.4 mg/dL (ref 8.4–10.5)
Chloride: 106 mEq/L (ref 96–112)
Creatinine, Ser: 1.08 mg/dL (ref 0.40–1.50)
GFR: 72.7 mL/min (ref >60.00)
Glucose, Bld: 104 mg/dL — ABNORMAL HIGH (ref 70–99)
Potassium: 3.6 mEq/L (ref 3.5–5.1)
Sodium: 138 mEq/L (ref 135–145)
Total Bilirubin: 0.2 mg/dL (ref 0.2–1.2)
Total Protein: 7.2 g/dL (ref 6.0–8.3)

## 2021-07-30 LAB — HEMOGLOBIN A1C: Hgb A1c MFr Bld: 6.8 % — ABNORMAL HIGH (ref 4.6–6.5)

## 2021-07-30 LAB — URIC ACID: Uric Acid, Serum: 6.5 mg/dL (ref 4.0–7.8)

## 2021-07-30 LAB — LDL CHOLESTEROL, DIRECT: Direct LDL: 82 mg/dL

## 2021-07-30 NOTE — Patient Instructions (Signed)
Good to see you Injection and aspiration in left elbow done today. Call or go to the ER if you develop a large red swollen joint with extreme pain or oozing puss.  Sent the fluid out for labs will be in contact once the results are in  I recommend you obtained a compression sleeve to help with your joint problems. There are many options on the market however I recommend obtaining a Left elbow Body Helix compression sleeve.  You can find information (including how to appropriate measure yourself for sizing) can be found at www.Body http://www.lambert.com/.  Many of these products are health savings account (HSA) eligible.   You can use the compression sleeve at any time throughout the day but is most important to use while being active as well as for 2 hours post-activity.   It is appropriate to ice following activity with the compression sleeve in place.    Follow up in a month

## 2021-08-01 ENCOUNTER — Ambulatory Visit: Payer: Commercial Managed Care - PPO | Admitting: Emergency Medicine

## 2021-08-01 ENCOUNTER — Other Ambulatory Visit: Payer: Self-pay | Admitting: Family

## 2021-08-01 ENCOUNTER — Encounter: Payer: Self-pay | Admitting: Emergency Medicine

## 2021-08-01 DIAGNOSIS — R918 Other nonspecific abnormal finding of lung field: Secondary | ICD-10-CM | POA: Insufficient documentation

## 2021-08-01 NOTE — Addendum Note (Signed)
Addended by: Gavin Potters R on: 08/01/2021 05:10 PM   Modules accepted: Orders

## 2021-08-01 NOTE — Assessment & Plan Note (Addendum)
Small pulmonary nodules, largest 6 mm in a low risk patient.  Need to follow for stability for 2 years total.  Next scan to be done in November.  I will try to get copies of his scans that were done at [facility] for comparison. No clear etiology for his nodules, no exposures noted.  We will plan diagnostics if suspicion increases on interval scan.

## 2021-08-01 NOTE — Patient Instructions (Signed)
We reviewed your CT scan of the chest today. We will work on getting a copy of your prior CTs from [place], [place] We will plan to repeat your CT scan of the chest in November 2023. Follow Dr. Lamonte Sakai in November after your CT so we can review the results together.
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
## 2021-08-05 LAB — ANAEROBIC AND AEROBIC CULTURE
AER RESULT:: NO GROWTH
MICRO NUMBER:: 13637262
MICRO NUMBER:: 13637263
SPECIMEN QUALITY:: ADEQUATE
SPECIMEN QUALITY:: ADEQUATE

## 2021-08-05 NOTE — Progress Notes (Signed)
Culture came back negative.  No bacterial growth present.

## 2021-08-13 ENCOUNTER — Other Ambulatory Visit: Payer: Self-pay

## 2021-08-13 ENCOUNTER — Emergency Department (HOSPITAL_BASED_OUTPATIENT_CLINIC_OR_DEPARTMENT_OTHER)
Admission: EM | Admit: 2021-08-13 | Discharge: 2021-08-13 | Disposition: A | Discharge status: Home / Self Care | Payer: Commercial Managed Care - PPO | Attending: Emergency Medicine | Admitting: Emergency Medicine

## 2021-08-13 ENCOUNTER — Encounter (HOSPITAL_BASED_OUTPATIENT_CLINIC_OR_DEPARTMENT_OTHER): Payer: Self-pay

## 2021-08-13 ENCOUNTER — Emergency Department (HOSPITAL_BASED_OUTPATIENT_CLINIC_OR_DEPARTMENT_OTHER): Payer: Commercial Managed Care - PPO

## 2021-08-13 DIAGNOSIS — E119 Type 2 diabetes mellitus without complications: Secondary | ICD-10-CM | POA: Insufficient documentation

## 2021-08-13 DIAGNOSIS — J45909 Unspecified asthma, uncomplicated: Secondary | ICD-10-CM | POA: Insufficient documentation

## 2021-08-13 DIAGNOSIS — R310 Gross hematuria: Secondary | ICD-10-CM | POA: Diagnosis not present

## 2021-08-13 DIAGNOSIS — R319 Hematuria, unspecified: Secondary | ICD-10-CM | POA: Diagnosis present

## 2021-08-13 LAB — CBC WITH DIFFERENTIAL/PLATELET
Abs Immature Granulocytes: 0.04 10*3/uL (ref 0.00–0.07)
Basophils Absolute: 0 10*3/uL (ref 0.0–0.1)
Basophils Relative: 0 %
Eosinophils Absolute: 0.1 10*3/uL (ref 0.0–0.5)
Eosinophils Relative: 1 %
HCT: 39.9 % (ref 39.0–52.0)
Hemoglobin: 13.2 g/dL (ref 13.0–17.0)
Immature Granulocytes: 0 %
Lymphocytes Relative: 23 %
Lymphs Abs: 2.5 10*3/uL (ref 0.7–4.0)
MCH: 29.4 pg (ref 26.0–34.0)
MCHC: 33.1 g/dL (ref 30.0–36.0)
MCV: 88.9 fL (ref 80.0–100.0)
Monocytes Absolute: 0.6 10*3/uL (ref 0.1–1.0)
Monocytes Relative: 6 %
Neutro Abs: 7.3 10*3/uL (ref 1.7–7.7)
Neutrophils Relative %: 70 %
Platelets: 272 10*3/uL (ref 150–400)
RBC: 4.49 MIL/uL (ref 4.22–5.81)
RDW: 13.8 % (ref 11.5–15.5)
WBC: 10.5 10*3/uL (ref 4.0–10.5)
nRBC: 0 % (ref 0.0–0.2)

## 2021-08-13 LAB — URINALYSIS, ROUTINE W REFLEX MICROSCOPIC

## 2021-08-13 LAB — BASIC METABOLIC PANEL
Anion gap: 10 (ref 5–15)
BUN: 20 mg/dL (ref 8–23)
CO2: 25 mmol/L (ref 22–32)
Calcium: 10.2 mg/dL (ref 8.9–10.3)
Chloride: 103 mmol/L (ref 98–111)
Creatinine, Ser: 0.93 mg/dL (ref 0.61–1.24)
GFR, Estimated: >60 mL/min (ref >60)
Glucose, Bld: 100 mg/dL — ABNORMAL HIGH (ref 70–99)
Potassium: 4 mmol/L (ref 3.5–5.1)
Sodium: 138 mmol/L (ref 135–145)

## 2021-08-13 LAB — URINALYSIS, MICROSCOPIC (REFLEX)
Bacteria, UA: NONE SEEN
RBC / HPF: >50 RBC/hpf (ref 0–5)

## 2021-08-13 NOTE — ED Provider Notes (Signed)
[facility] EMERGENCY DEPT Provider Note   CSN: [id number] Arrival date & time: 08/13/21  1816     History  Chief Complaint  Patient presents with   Hematuria    David Wall is a 66 y.o. male.  Patient is a 66 year old male with a history of gout, GERD, asthma and diabetes who is presenting today with complaints of hematuria that started this afternoon about 4 PM.  Patient did notice some mild left-sided back pain yesterday and some today but no severe pain.  He has no dysuria, frequency or urgency but noticed at 4 PM when he urinated there was blood in it and then again a second time.  He feels like he is completely emptying his bladder.  He has no prior history of hematuria.  No history of prostate cancer, radiation or tobacco use.  He does not take anticoagulation.  The history is provided by the patient.  Hematuria       Home Medications Prior to Admission medications   Medication Sig Start Date End Date Taking? Authorizing Provider  omeprazole (PRILOSEC) 20 MG capsule Take 1 capsule (20 mg total) by mouth 2 (two) times daily before a meal. 09/19/20   Marrian Salvage, FNP  probenecid (BENEMID) 500 MG tablet Take 1 tablet (500 mg total) by mouth 2 (two) times daily. 09/19/20   Marrian Salvage, FNP  rosuvastatin (CRESTOR) 10 MG tablet TAKE 1 TABLET BY MOUTH EVERY DAY 05/19/21   Marrian Salvage, FNP  sildenafil (VIAGRA) 100 MG tablet Take 0.5-1 tablets (50-100 mg total) by mouth daily as needed for erectile dysfunction. 07/26/20   Marrian Salvage, FNP      Allergies    Amoxicillin, Aspirin, Penicillins, and Allopurinol    Review of Systems   Review of Systems  Genitourinary:  Positive for hematuria.    Physical Exam Updated Vital Signs BP 139/82   Pulse (!) 57   Temp 98.3 F (36.8 C) (Oral)   Resp 16   SpO2 98%  Physical Exam Vitals and nursing note reviewed.  HENT:     Head: Normocephalic.  Cardiovascular:     Rate and  Rhythm: Bradycardia present.  Pulmonary:     Effort: Pulmonary effort is normal.  Abdominal:     General: Abdomen is flat.     Palpations: Abdomen is soft.     Tenderness: There is no abdominal tenderness. There is no right CVA tenderness or left CVA tenderness.  Musculoskeletal:     Right lower leg: No edema.     Left lower leg: No edema.  Skin:    General: Skin is warm and dry.  Neurological:     Mental Status: He is alert. Mental status is at baseline.     ED Results / Procedures / Treatments   Labs (all labs ordered are listed, but only abnormal results are displayed) Labs Reviewed  URINALYSIS, ROUTINE W REFLEX MICROSCOPIC - Abnormal; Notable for the following components:      Result Value   Color, Urine RED (*)    APPearance TURBID (*)    Glucose, UA   (*)    Value: TEST NOT REPORTED DUE TO COLOR INTERFERENCE OF URINE PIGMENT   Hgb urine dipstick   (*)    Value: TEST NOT REPORTED DUE TO COLOR INTERFERENCE OF URINE PIGMENT   Bilirubin Urine   (*)    Value: TEST NOT REPORTED DUE TO COLOR INTERFERENCE OF URINE PIGMENT   Ketones, ur   (*)  Value: TEST NOT REPORTED DUE TO COLOR INTERFERENCE OF URINE PIGMENT   Protein, ur   (*)    Value: TEST NOT REPORTED DUE TO COLOR INTERFERENCE OF URINE PIGMENT   Nitrite   (*)    Value: TEST NOT REPORTED DUE TO COLOR INTERFERENCE OF URINE PIGMENT   Leukocytes,Ua   (*)    Value: TEST NOT REPORTED DUE TO COLOR INTERFERENCE OF URINE PIGMENT   All other components within normal limits  BASIC METABOLIC PANEL - Abnormal; Notable for the following components:   Glucose, Bld 100 (*)    All other components within normal limits  CBC WITH DIFFERENTIAL/PLATELET  URINALYSIS, MICROSCOPIC (REFLEX)    EKG None  Radiology CT Renal Stone Study  Result Date: 08/13/2021 CLINICAL DATA:  Flank pain, kidney stone suspected. Gross hematuria. Left lower back pain yesterday. EXAM: CT ABDOMEN AND PELVIS WITHOUT CONTRAST TECHNIQUE: Multidetector CT  imaging of the abdomen and pelvis was performed following the standard protocol without IV contrast. RADIATION DOSE REDUCTION: This exam was performed according to the departmental dose-optimization program which includes automated exposure control, adjustment of the mA and/or kV according to patient size and/or use of iterative reconstruction technique. COMPARISON:  CT examination dated December 19, 2013 FINDINGS: Lower chest: No acute abnormality. Hepatobiliary: No focal liver abnormality is seen. No gallstones, gallbladder wall thickening, or biliary dilatation. Pancreas: Unremarkable. No pancreatic ductal dilatation or surrounding inflammatory changes. Spleen: No splenic injury or perisplenic hematoma. Adrenals/Urinary Tract: Adrenal glands are unremarkable. Kidneys are normal, without renal calculi, focal lesion, or hydronephrosis. There are two urinary bladder calculi measuring 1.1 and 1.3 cm. There is no urinary bladder wall thickening. Stomach/Bowel: Stomach is within normal limits. Appendix appears normal. No evidence of bowel wall thickening, distention, or inflammatory changes. Vascular/Lymphatic: Mild aortic atherosclerosis. No enlarged abdominal or pelvic lymph nodes. Reproductive: Prostate is unremarkable. Other: Fat containing left inguinal hernia. No abdominopelvic ascites. Musculoskeletal: Degenerative disc disease of the lumbar spine with associated facet joint arthropathy prominent at L4-L5 and L5-S1. Diffuse idiopathic skeletal hyperostosis. IMPRESSION: 1. Two urinary bladder calculi measuring 1.1 and 1.3 cm without urinary bladder wall thickening. No evidence of nephrolithiasis, hydronephrosis or ureteral calculus. No evidence of recently passed calculus. 2. Bowel loops are normal in caliber. Normal appendix. No evidence of colitis or diverticulitis. 3. Degenerate disc disease of the lumbar spine. No acute osseous abnormality. Electronically Signed   By: Keane Police D.O.   On: 08/13/2021 19:51     Procedures Procedures    Medications Ordered in ED Medications - No data to display  ED Course/ Medical Decision Making/ A&P                           Medical Decision Making Amount and/or Complexity of Data Reviewed Labs: ordered. Decision-making details documented in ED Course. Radiology: ordered and independent interpretation performed. Decision-making details documented in ED Course.  Risk OTC drugs.   Pt with multiple medical problems and comorbidities and presenting today with a complaint that caries a high risk for morbidity and mortality.  Presenting today with hematuria.  He has no abdominal pain on exam but does report some mild left flank pain.  Not reproducible on exam.  Low suspicion for urinary retention.  Patient is not having any symptoms suggestive of UTI.  He has had no dysuria, frequency or urgency prior to today or even with the hematuria.  Low suspicion for cystitis.  I have independently interpreted patient's labs  and his UA today shows blood but no bacteria and only 6-10 white cells which is most likely from his numerous amount of red cells.  CBC and BMP are within normal limits.  He has no history of kidney stones or bladder stones.  He has never used cigarettes and is low risk for bladder carcinoma.  CT to further evaluate for any type of stone is pending.  Patient will need to follow-up with urology.  8:21 PM I have independently visualized and interpreted pt's images today.  CT does show stones in the bladder.  No evidence of hydronephrosis.  Radiology reports 2 bladder calculi without evidence of recently passed stones.  Otherwise bowel loops are within normal limits and degenerative disc disease of the spine.  Findings were discussed with the patient.  Unclear if bladder stones be the cause of his bleeding but still feel that he needs urology follow-up.  Patient encouraged to stay hydrated.  Will have urology further evaluate.  This time stable for discharge  home.          Final Clinical Impression(s) / ED Diagnoses Final diagnoses:  Gross hematuria    Rx / DC Orders ED Discharge Orders     None         Blanchie Dessert, MD 08/13/21 2021

## 2021-08-13 NOTE — ED Triage Notes (Signed)
Pt presents POV from home for blood in his urine, passing some blood clots, denies penile d/c. Symptoms started approx 2 hours

## 2021-08-13 NOTE — Discharge Instructions (Addendum)
Sure you are staying hydrated.  The next few times to urinate there will probably still be blood but it should taper off and stop.

## 2021-08-28 ENCOUNTER — Telehealth: Payer: Self-pay | Admitting: Family

## 2021-08-28 DIAGNOSIS — N529 Male erectile dysfunction, unspecified: Secondary | ICD-10-CM

## 2021-08-28 DIAGNOSIS — K219 Gastro-esophageal reflux disease without esophagitis: Secondary | ICD-10-CM

## 2021-08-28 MED ORDER — SILDENAFIL CITRATE 100 MG PO TABS: 50.0000 mg | ORAL_TABLET | Freq: Every day | ORAL | 1 refills | Status: DC | PRN

## 2021-08-28 MED ORDER — OMEPRAZOLE 20 MG PO CPDR: 20.0000 mg | DELAYED_RELEASE_CAPSULE | Freq: Two times a day (BID) | ORAL | 3 refills | Status: DC

## 2021-08-28 MED ORDER — PROBENECID 500 MG PO TABS: 500.0000 mg | ORAL_TABLET | Freq: Two times a day (BID) | ORAL | 1 refills | Status: DC

## 2021-08-28 NOTE — Addendum Note (Signed)
Addended by: Kittie Plater,  HUA on: 08/28/2021 05:02 PM   Modules accepted: Orders

## 2021-08-28 NOTE — Telephone Encounter (Signed)
Medication:   rosuvastatin (CRESTOR) 10 MG tablet [[id number]]   omeprazole (PRILOSEC) 20 MG capsule [[id number]]   probenecid (BENEMID) 500 MG tablet [[id number]]   sildenafil (VIAGRA) 100 MG tablet [[id number]]   Has the patient contacted their pharmacy? No. (If no, request that the patient contact the pharmacy for the refill.) (If yes, when and what did the pharmacy advise?)  Preferred Pharmacy (with phone number or street name):   [id number] [place] [address], [id number] Phone:  [phone] Fax:  [phone]  Agent: Please be advised that RX refills may take up to 3 business days. We ask that you follow-up with your pharmacy.

## 2021-08-28 NOTE — Telephone Encounter (Signed)
Rx has been sent to pharmacy

## 2021-09-02 ENCOUNTER — Telehealth: Payer: Self-pay | Admitting: Family

## 2021-09-02 MED ORDER — TAMSULOSIN HCL 0.4 MG PO CAPS: 0.4000 mg | ORAL_CAPSULE | Freq: Every day | ORAL | 1 refills | Status: DC

## 2021-09-02 NOTE — Telephone Encounter (Signed)
Rx sent to pharmacy   

## 2021-09-02 NOTE — Telephone Encounter (Signed)
Medication:   tamsulosin (FLOMAX) 0.4 MG CAPS capsule [[id number]]   Gout Medication  Has the patient contacted their pharmacy? No. (If no, request that the patient contact the pharmacy for the refill.) (If yes, when and what did the pharmacy advise?)  Preferred Pharmacy (with phone number or street name):   [id number] [place] [address], [id number] Phone:  [phone] Fax:  [phone]  Agent: Please be advised that RX refills may take up to 3 business days. We ask that you follow-up with your pharmacy.

## 2021-09-03 ENCOUNTER — Ambulatory Visit: Payer: Commercial Managed Care - PPO | Admitting: Family Medicine

## 2021-09-03 VITALS — BP 128/72 | HR 73 | Ht 68.0 in | Wt 266.0 lb

## 2021-09-03 DIAGNOSIS — M7022 Olecranon bursitis, left elbow: Secondary | ICD-10-CM | POA: Diagnosis not present

## 2021-09-03 NOTE — Patient Instructions (Addendum)
Thank you for coming in today.   Glad you are feeling better!  Check back with me as needed
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
## 2021-10-02 ENCOUNTER — Ambulatory Visit: Payer: Commercial Managed Care - PPO | Admitting: Family

## 2021-10-14 ENCOUNTER — Encounter: Payer: Self-pay | Admitting: Family

## 2021-10-14 ENCOUNTER — Ambulatory Visit: Payer: Commercial Managed Care - PPO | Admitting: Family

## 2021-10-14 VITALS — BP 130/74 | HR 60 | Temp 98.2°F | Ht 68.0 in | Wt 264.4 lb

## 2021-10-14 DIAGNOSIS — R319 Hematuria, unspecified: Secondary | ICD-10-CM

## 2021-10-14 DIAGNOSIS — M1A9XX0 Chronic gout, unspecified, without tophus (tophi): Secondary | ICD-10-CM

## 2021-10-14 DIAGNOSIS — Z23 Encounter for immunization: Secondary | ICD-10-CM

## 2021-10-14 DIAGNOSIS — R0789 Other chest pain: Secondary | ICD-10-CM

## 2021-10-14 MED ORDER — CYCLOBENZAPRINE HCL 10 MG PO TABS: 10.0000 mg | ORAL_TABLET | Freq: Three times a day (TID) | ORAL | 0 refills | Status: DC | PRN

## 2021-10-14 MED ORDER — PROBENECID 500 MG PO TABS
500.0000 mg | ORAL_TABLET | Freq: Two times a day (BID) | ORAL | 3 refills | Status: DC
Start: 2021-10-14 — End: 2022-02-10

## 2021-10-14 NOTE — Progress Notes (Signed)
David Wall is a 66 y.o. male with the following history as recorded in EpicCare:  Patient Active Problem List   Diagnosis Date Noted   Pulmonary nodules 08/01/2021   Olecranon bursitis, left elbow 07/30/2021   Cubital tunnel syndrome, left 12/26/2020   Unilateral primary osteoarthritis, left knee 02/28/2019   Osteoarthritis of right knee 06/15/2014   Status post total right knee replacement 06/15/2014   Sleep disorder 04/25/2014   Inguinal hernia 12/21/2013   Diabetes ([place]) 10/11/2013   Routine general medical examination at a health care facility 10/10/2013   Screening for prostate cancer 10/10/2013   Gout 04/26/2008   RECTAL BLEEDING 04/26/2008   CHEST PAIN UNSPECIFIED 04/25/2007   HYPERCHOLESTEROLEMIA 01/26/2007   UNSPECIFIED CHRONIC ISCHEMIC HEART DISEASE 01/26/2007   CERVICAL RADICULOPATHY, RIGHT 01/26/2007   INSOMNIA 01/26/2007    Current Outpatient Medications  Medication Sig Dispense Refill   omeprazole (PRILOSEC) 20 MG capsule Take 1 capsule (20 mg total) by mouth 2 (two) times daily before a meal. 180 capsule 3   rosuvastatin (CRESTOR) 10 MG tablet TAKE 1 TABLET BY MOUTH EVERY DAY 90 tablet 1   sildenafil (VIAGRA) 100 MG tablet Take 0.5-1 tablets (50-100 mg total) by mouth daily as needed for erectile dysfunction. 10 tablet 1   tamsulosin (FLOMAX) 0.4 MG CAPS capsule Take 1 capsule (0.4 mg total) by mouth daily. 90 capsule 1   cyclobenzaprine (FLEXERIL) 10 MG tablet Take 1 tablet (10 mg total) by mouth 3 (three) times daily as needed. 30 tablet 0   probenecid (BENEMID) 500 MG tablet Take 1 tablet (500 mg total) by mouth 2 (two) times daily. 180 tablet 3   No current facility-administered medications for this visit.    Allergies: Amoxicillin, Aspirin, Penicillins, and Allopurinol  Past Medical History:  Diagnosis Date   Arthritis    Asthma    hx of in childhood   Diabetes mellitus without complication (HCC)    no tx   GERD (gastroesophageal reflux disease)     Gout    H/O urinary frequency    as noted in H&P per Dr Loanne Drilling 04/25/2014     Past Surgical History:  Procedure Laterality Date   COLONOSCOPY     EYE SURGERY     laser surgery bilat    JOINT REPLACEMENT     TOTAL KNEE ARTHROPLASTY Right 06/15/2014   Procedure: RIGHT TOTAL KNEE ARTHROPLASTY;  Surgeon: Mcarthur Rossetti, MD;  Location: [facility];  Service: Orthopedics;  Laterality: Right;   UPPER GI ENDOSCOPY      Family History  Problem Relation Age of Onset   Healthy Mother    Healthy Father    Colon cancer Neg Hx    Esophageal cancer Neg Hx    Stomach cancer Neg Hx    Rectal cancer Neg Hx     Social History   Tobacco Use   Smoking status: Never    Passive exposure: Never   Smokeless tobacco: Never  Substance Use Topics   Alcohol use: Yes    Comment: occasional; 1-2 drinks per week per pt    Subjective:  Requesting refill on Probenecid; Would like flu shot;  Has been having right sided chest pain with movement x 1 month; no chest pain or shortness of breath on exertion; had abdominal/ pelvic CT in July 2023 and no gallstones noted;     Objective:  Vitals:   10/14/21 1517  BP: 130/74  Pulse: 60  Temp: 98.2 F (36.8 C)  TempSrc: Oral  SpO2: 98%  Weight: 264 lb 6.4 oz (119.9 kg)  Height: '5\' 8"'$  (1.727 m)    General: Well developed, well nourished, in no acute distress  Skin : Warm and dry.  Head: Normocephalic and atraumatic  Eyes: Sclera and conjunctiva clear; pupils round and reactive to light; extraocular movements intact  Ears: External normal; canals clear; tympanic membranes normal  Oropharynx: Pink, supple. No suspicious lesions  Neck: Supple without thyromegaly, adenopathy  Lungs: Respirations unlabored;  Neurologic: Alert and oriented; speech intact; face symmetrical; moves all extremities well; CNII-XII intact without focal deficit   Assessment:  1. Hematuria, unspecified type   2. Atypical chest pain   3. Chronic gout without tophus, unspecified  cause, unspecified site   4. Need for immunization against influenza     Plan:  Refer to urology; EKG shows NSR; patient is scheduled for chest CT next week; suspect muscular; refill updated on Flexeril; Refill updated; Flu shot updated;   No follow-ups on file.  Orders Placed This Encounter  Procedures   Flu Vaccine QUAD 5moIM (Fluarix, Fluzone & Alfiuria Quad PF)   Ambulatory referral to Urology    Referral Priority:   Routine    Referral Type:   Consultation    Referral Reason:   Specialty Services Required    Requested Specialty:   Urology    Number of Visits Requested:   1   EKG 12-Lead    Requested Prescriptions   Signed Prescriptions Disp Refills   probenecid (BENEMID) 500 MG tablet 180 tablet 3    Sig: Take 1 tablet (500 mg total) by mouth 2 (two) times daily.   cyclobenzaprine (FLEXERIL) 10 MG tablet 30 tablet 0    Sig: Take 1 tablet (10 mg total) by mouth 3 (three) times daily as needed.

## 2021-10-21 ENCOUNTER — Ambulatory Visit
Admission: RE | Admit: 2021-10-21 | Discharge: 2021-10-21 | Disposition: A | Discharge status: Home / Self Care | Payer: Commercial Managed Care - PPO | Source: Ambulatory Visit | Attending: Emergency Medicine | Admitting: Emergency Medicine

## 2021-10-21 DIAGNOSIS — R918 Other nonspecific abnormal finding of lung field: Secondary | ICD-10-CM

## 2021-12-03 ENCOUNTER — Ambulatory Visit (INDEPENDENT_AMBULATORY_CARE_PROVIDER_SITE_OTHER): Payer: Commercial Managed Care - PPO | Admitting: Podiatry

## 2021-12-03 DIAGNOSIS — L600 Ingrowing nail: Secondary | ICD-10-CM | POA: Diagnosis not present

## 2021-12-08 NOTE — Progress Notes (Signed)
   Chief Complaint  Patient presents with   Ingrown Toenail    Patient is here or right foot great toe ingrown toenail.    Subjective: Patient presents today for evaluation of pain to the right great toenail plate. Patient is concerned for possible ingrown nail.  It is very sensitive to touch.  Patient presents today for further treatment and evaluation.  Past Medical History:  Diagnosis Date   Arthritis    Asthma    hx of in childhood   Diabetes mellitus without complication ([place])    no tx   GERD (gastroesophageal reflux disease)    Gout    H/O urinary frequency    as noted in H&P per Dr Loanne Drilling 04/25/2014     Objective:  General: Well developed, nourished, in no acute distress, alert and oriented x3   Dermatology: Skin is warm, dry and supple bilateral.  Right great toenail is tender with evidence of an ingrowing nail. Pain on palpation noted to the border of the nail fold. The remaining nails appear unremarkable at this time. There are no open sores, lesions.  Vascular: DP and PT pulses palpable.  No clinical evidence of vascular compromise  Neruologic: Grossly intact via light touch bilateral.  Musculoskeletal: No pedal deformity noted  Assesement: #1 Paronychia with ingrowing nail right great toenail  Plan of Care:  1. Patient evaluated.  2. Discussed treatment alternatives and plan of care. Explained nail avulsion procedure and post procedure course to patient. 3. Patient opted for permanent partial nail avulsion of the ingrown portion of the nail.  4. Prior to procedure, local anesthesia infiltration utilized using 3 ml of a 50:50 mixture of 2% plain lidocaine and 0.5% plain marcaine in a normal hallux block fashion and a betadine prep performed.  5. Partial permanent nail avulsion with chemical matrixectomy performed using [id number] applications of phenol followed by alcohol flush.  6. Light dressing applied.  Post care instructions provided 7.  Recommend triple  antibiotic ointment 8.  Return to clinic 3 weeks for follow-up  Edrick Kins, DPM [facility]  Dr. Edrick Kins, DPM    [address], [place] [address]                Office [phone]  Fax [phone]

## 2021-12-17 ENCOUNTER — Ambulatory Visit: Payer: Commercial Managed Care - PPO | Admitting: Family Medicine

## 2021-12-17 ENCOUNTER — Ambulatory Visit (INDEPENDENT_AMBULATORY_CARE_PROVIDER_SITE_OTHER): Payer: Commercial Managed Care - PPO

## 2021-12-17 VITALS — BP 120/70 | HR 84 | Ht 68.0 in | Wt 265.0 lb

## 2021-12-17 DIAGNOSIS — M5432 Sciatica, left side: Secondary | ICD-10-CM | POA: Diagnosis not present

## 2021-12-17 MED ORDER — PREDNISONE 50 MG PO TABS: 50.0000 mg | ORAL_TABLET | Freq: Every day | ORAL | 0 refills | Status: DC

## 2021-12-17 MED ORDER — GABAPENTIN 300 MG PO CAPS: 300.0000 mg | ORAL_CAPSULE | Freq: Three times a day (TID) | ORAL | 3 refills | Status: DC | PRN

## 2021-12-17 NOTE — Patient Instructions (Signed)
Thank you for coming in today.   Please get an Xray today before you leave   Take the gabapenitn mostly at bedtime.  We can increase the dose if needed.   Take the predsnione for 5 days.   Let me know if this is not working.   Keep me updated.

## 2021-12-17 NOTE — Progress Notes (Signed)
    David Wall is a 66 y.o. male who presents to [place] at [facility] today for Left leg pain. Patient was last seen by [name] on 09/03/2021 for Left elbow pain. Patient states been having left leg pain started Saturday. And locates pain to left thigh and radiates down to his calf. Patient will only get relief from the pain when sitting any other position is not good for the pain. Patient states that the left elbow started flaring up nothing like it was but he has noticed pain and some pain in the left shoulder when laying on the left side.   Radiates: yes  Mechanical symptoms: none Numbness/tingling: no Weakness: no Aggravates: standing, walking, getting up from a chair, getting out of the bed. Also has pain while laying.  Treatments tried: aleve, heat, massage, icy hot    Pertinent review of systems: No fevers or chills  Relevant historical information: Gout   Exam:  BP 120/70   Pulse 84   Ht '5\' 8"'$  (1.727 m)   Wt 265 lb (120.2 kg)   SpO2 97%   BMI 40.29 kg/m  General: Well Developed, well nourished, and in no acute distress.   MSK: L-spine: Nontender midline.  Decreased lumbar motion.  Positive slump test and straight leg raise test.  Lower extremity strength reflexes and sensation are intact throughout.  Left elbow is normal-appearing nontender normal motion.  No swelling at the olecranon.   Lab and Radiology Results  X-ray images L-spine obtained today personally and independently interpreted DDD L4-5 and L5-S1.  Facet DJD L4-5 and L5-S1.  No acute fractures are visible. Await formal radiology review     Assessment and Plan: 66 y.o. male with left posterior thigh and calf pain thought to be S1 radiculopathy.  Plan for course of prednisone and gabapentin.  If not improving will proceed to MRI for further evaluation and epidural steroid injection planning.  Left elbow pain could be gout or increased pain from pressing off with his elbow to  stand up.  Watchful waiting for now.  Gout is well-controlled on last check.   PDMP not reviewed this encounter. Orders Placed This Encounter  Procedures   DG Lumbar Spine 2-3 Views    Standing Status:   Future    Number of Occurrences:   1    Standing Expiration Date:   12/18/2022    Order Specific Question:   Reason for Exam (SYMPTOM  OR DIAGNOSIS REQUIRED)    Answer:   eval left lumbar rad    Order Specific Question:   Preferred imaging location?    Answer:   [facility]   Meds ordered this encounter  Medications   predniSONE (DELTASONE) 50 MG tablet    Sig: Take 1 tablet (50 mg total) by mouth daily.    Dispense:  5 tablet    Refill:  0   gabapentin (NEURONTIN) 300 MG capsule    Sig: Take 1 capsule (300 mg total) by mouth 3 (three) times daily as needed.    Dispense:  90 capsule    Refill:  3     Discussed warning signs or symptoms. Please see discharge instructions. Patient expresses understanding.   .[email or web address]

## 2021-12-19 NOTE — Progress Notes (Signed)
Lumbar spine x-ray shows mild arthritis changes.

## 2022-01-06 ENCOUNTER — Other Ambulatory Visit: Payer: Self-pay | Admitting: Family

## 2022-01-06 ENCOUNTER — Telehealth: Payer: Self-pay | Admitting: Family

## 2022-01-06 MED ORDER — COLCHICINE 0.6 MG PO TABS: ORAL_TABLET | ORAL | 1 refills | Status: DC

## 2022-01-06 NOTE — Telephone Encounter (Signed)
Patient's wife calling to request refill on gout medication because he is having a flare up. Patient does not have the bottle anymore so he doesn't remember the name of it. Please send to [facility] on [address]

## 2022-01-06 NOTE — Telephone Encounter (Signed)
Spoke with patient and he need the purple pill, may be colchicine.  He stated he had it about 6 months ago.  His last fill of colchicine was 06/2021

## 2022-02-10 ENCOUNTER — Encounter: Payer: Self-pay | Admitting: Family

## 2022-02-10 ENCOUNTER — Telehealth: Payer: Self-pay | Admitting: Family

## 2022-02-10 ENCOUNTER — Ambulatory Visit (INDEPENDENT_AMBULATORY_CARE_PROVIDER_SITE_OTHER): Payer: Commercial Managed Care - PPO | Admitting: Family

## 2022-02-10 VITALS — BP 136/76 | HR 65 | Temp 97.6°F | Ht 68.0 in | Wt 273.4 lb

## 2022-02-10 DIAGNOSIS — M1A9XX0 Chronic gout, unspecified, without tophus (tophi): Secondary | ICD-10-CM | POA: Diagnosis not present

## 2022-02-10 DIAGNOSIS — J069 Acute upper respiratory infection, unspecified: Secondary | ICD-10-CM | POA: Diagnosis not present

## 2022-02-10 DIAGNOSIS — E119 Type 2 diabetes mellitus without complications: Secondary | ICD-10-CM | POA: Diagnosis not present

## 2022-02-10 MED ORDER — PROBENECID 500 MG PO TABS: 500.0000 mg | ORAL_TABLET | Freq: Two times a day (BID) | ORAL | 3 refills | Status: DC

## 2022-02-10 MED ORDER — BENZONATATE 200 MG PO CAPS: 200.0000 mg | ORAL_CAPSULE | Freq: Three times a day (TID) | ORAL | 0 refills | Status: DC | PRN

## 2022-02-10 NOTE — Progress Notes (Signed)
David Wall is a 67 y.o. male with the following history as recorded in EpicCare:  Patient Active Problem List   Diagnosis Date Noted   Pulmonary nodules 08/01/2021   Olecranon bursitis, left elbow 07/30/2021   Cubital tunnel syndrome, left 12/26/2020   Unilateral primary osteoarthritis, left knee 02/28/2019   Osteoarthritis of right knee 06/15/2014   Status post total right knee replacement 06/15/2014   Sleep disorder 04/25/2014   Inguinal hernia 12/21/2013   Diabetes ([place]) 10/11/2013   Routine general medical examination at a health care facility 10/10/2013   Screening for prostate cancer 10/10/2013   Gout 04/26/2008   RECTAL BLEEDING 04/26/2008   CHEST PAIN UNSPECIFIED 04/25/2007   HYPERCHOLESTEROLEMIA 01/26/2007   UNSPECIFIED CHRONIC ISCHEMIC HEART DISEASE 01/26/2007   CERVICAL RADICULOPATHY, RIGHT 01/26/2007   INSOMNIA 01/26/2007    Current Outpatient Medications  Medication Sig Dispense Refill   benzonatate (TESSALON) 200 MG capsule Take 1 capsule (200 mg total) by mouth 3 (three) times daily as needed for cough. 20 capsule 0   colchicine 0.6 MG tablet Take two tablets as one dose for gout flare as needed followed one hour later by one tablet. No more than three tablets each day. 30 tablet 1   gabapentin (NEURONTIN) 300 MG capsule Take 1 capsule (300 mg total) by mouth 3 (three) times daily as needed. 90 capsule 3   omeprazole (PRILOSEC) 20 MG capsule Take 1 capsule (20 mg total) by mouth 2 (two) times daily before a meal. 180 capsule 3   rosuvastatin (CRESTOR) 10 MG tablet TAKE 1 TABLET BY MOUTH EVERY DAY 90 tablet 1   sildenafil (VIAGRA) 100 MG tablet Take 0.5-1 tablets (50-100 mg total) by mouth daily as needed for erectile dysfunction. 10 tablet 1   tamsulosin (FLOMAX) 0.4 MG CAPS capsule Take 1 capsule (0.4 mg total) by mouth daily. 90 capsule 1   probenecid (BENEMID) 500 MG tablet Take 1 tablet (500 mg total) by mouth 2 (two) times daily. 180 tablet 3   No current  facility-administered medications for this visit.    Allergies: Amoxicillin, Aspirin, Penicillins, and Allopurinol  Past Medical History:  Diagnosis Date   Arthritis    Asthma    hx of in childhood   Diabetes mellitus without complication (HCC)    no tx   GERD (gastroesophageal reflux disease)    Gout    H/O urinary frequency    as noted in H&P per Dr Loanne Drilling 04/25/2014     Past Surgical History:  Procedure Laterality Date   COLONOSCOPY     EYE SURGERY     laser surgery bilat    JOINT REPLACEMENT     TOTAL KNEE ARTHROPLASTY Right 06/15/2014   Procedure: RIGHT TOTAL KNEE ARTHROPLASTY;  Surgeon: Mcarthur Rossetti, MD;  Location: [facility];  Service: Orthopedics;  Laterality: Right;   UPPER GI ENDOSCOPY      Family History  Problem Relation Age of Onset   Healthy Mother    Healthy Father    Colon cancer Neg Hx    Esophageal cancer Neg Hx    Stomach cancer Neg Hx    Rectal cancer Neg Hx     Social History   Tobacco Use   Smoking status: Never    Passive exposure: Never   Smokeless tobacco: Never  Substance Use Topics   Alcohol use: Yes    Comment: occasional; 1-2 drinks per week per pt    Subjective:   Cough x 1 week; describes as "dry  cough." No chest pain or shortness of breath; no fever;   Also due for Type 2 Diabetes follow up;   Objective:  Vitals:   02/10/22 1551  BP: 136/76  Pulse: 65  Temp: 97.6 F (36.4 C)  SpO2: 98%  Weight: 273 lb 6.4 oz (124 kg)  Height: '5\' 8"'$  (1.727 m)    General: Well developed, well nourished, in no acute distress  Skin : Warm and dry.  Head: Normocephalic and atraumatic  Eyes: Sclera and conjunctiva clear; pupils round and reactive to light; extraocular movements intact  Ears: External normal; canals clear; tympanic membranes normal  Oropharynx: Pink, supple. No suspicious lesions  Neck: Supple without thyromegaly, adenopathy  Lungs: Respirations unlabored; clear to auscultation bilaterally without wheeze, rales, rhonchi   CVS exam: normal rate and regular rhythm.  Neurologic: Alert and oriented; speech intact; face symmetrical; moves all extremities well; CNII-XII intact without focal deficit   Assessment:  1. Type 2 diabetes mellitus without complication, without long-term current use of insulin ([place])   2. Chronic gout without tophus, unspecified cause, unspecified site   3. Viral URI     Plan:  Update labs today; currently diet controlled;  Refill updated on Probenecid; Rx for [employer]; increase fluids, rest and follow up worse, no better.  No follow-ups on file.  Orders Placed This Encounter  Procedures   Comp Met (CMET)   Hemoglobin A1c   CBC with Differential/Platelet   Urine Microalbumin w/creat. ratio    Requested Prescriptions   Signed Prescriptions Disp Refills   probenecid (BENEMID) 500 MG tablet 180 tablet 3    Sig: Take 1 tablet (500 mg total) by mouth 2 (two) times daily.   benzonatate (TESSALON) 200 MG capsule 20 capsule 0    Sig: Take 1 capsule (200 mg total) by mouth 3 (three) times daily as needed for cough.

## 2022-02-10 NOTE — Telephone Encounter (Signed)
-----  Message from Marrian Salvage, [place] sent at 10/14/2021  3:51 PM EDT -----

## 2022-02-11 ENCOUNTER — Encounter: Payer: Self-pay | Admitting: *Deleted

## 2022-02-11 LAB — COMPREHENSIVE METABOLIC PANEL
ALT: 21 U/L (ref 0–53)
AST: 20 U/L (ref 0–37)
Albumin: 4.2 g/dL (ref 3.5–5.2)
Alkaline Phosphatase: 53 U/L (ref 39–117)
BUN: 15 mg/dL (ref 6–23)
CO2: 25 mEq/L (ref 19–32)
Calcium: 9.6 mg/dL (ref 8.4–10.5)
Chloride: 104 mEq/L (ref 96–112)
Creatinine, Ser: 1.21 mg/dL (ref 0.40–1.50)
GFR: 63.19 mL/min (ref >60.00)
Glucose, Bld: 127 mg/dL — ABNORMAL HIGH (ref 70–99)
Potassium: 4 mEq/L (ref 3.5–5.1)
Sodium: 139 mEq/L (ref 135–145)
Total Bilirubin: 0.3 mg/dL (ref 0.2–1.2)
Total Protein: 6.7 g/dL (ref 6.0–8.3)

## 2022-02-11 LAB — CBC WITH DIFFERENTIAL/PLATELET
Basophils Absolute: 0 10*3/uL (ref 0.0–0.1)
Basophils Relative: 0.7 % (ref 0.0–3.0)
Eosinophils Absolute: 0.3 10*3/uL (ref 0.0–0.7)
Eosinophils Relative: 4.7 % (ref 0.0–5.0)
HCT: 38.4 % — ABNORMAL LOW (ref 39.0–52.0)
Hemoglobin: 13.2 g/dL (ref 13.0–17.0)
Lymphocytes Relative: 28.6 % (ref 12.0–46.0)
Lymphs Abs: 2 10*3/uL (ref 0.7–4.0)
MCHC: 34.3 g/dL (ref 30.0–36.0)
MCV: 89.7 fl (ref 78.0–100.0)
Monocytes Absolute: 0.4 10*3/uL (ref 0.1–1.0)
Monocytes Relative: 5.5 % (ref 3.0–12.0)
Neutro Abs: 4.3 10*3/uL (ref 1.4–7.7)
Neutrophils Relative %: 60.5 % (ref 43.0–77.0)
Platelets: 313 10*3/uL (ref 150.0–400.0)
RBC: 4.28 Mil/uL (ref 4.22–5.81)
RDW: 13.1 % (ref 11.5–15.5)
WBC: 7.2 10*3/uL (ref 4.0–10.5)

## 2022-02-11 LAB — MICROALBUMIN / CREATININE URINE RATIO
Creatinine,U: 150.6 mg/dL
Microalb Creat Ratio: 0.5 mg/g (ref 0.0–30.0)
Microalb, Ur: <0.7 mg/dL (ref 0.0–1.9)

## 2022-02-11 LAB — HEMOGLOBIN A1C: Hgb A1c MFr Bld: 6.8 % — ABNORMAL HIGH (ref 4.6–6.5)

## 2022-04-01 ENCOUNTER — Other Ambulatory Visit: Payer: Self-pay | Admitting: Ophthalmology

## 2022-04-03 ENCOUNTER — Other Ambulatory Visit: Payer: Self-pay | Admitting: Family

## 2022-06-02 ENCOUNTER — Other Ambulatory Visit: Payer: Self-pay | Admitting: Family

## 2022-06-02 ENCOUNTER — Ambulatory Visit (INDEPENDENT_AMBULATORY_CARE_PROVIDER_SITE_OTHER): Payer: Commercial Managed Care - PPO | Admitting: Family Medicine

## 2022-06-02 ENCOUNTER — Telehealth: Payer: Self-pay | Admitting: Family

## 2022-06-02 ENCOUNTER — Encounter: Payer: Self-pay | Admitting: Family Medicine

## 2022-06-02 VITALS — BP 106/54 | HR 77 | Temp 98.2°F | Ht 68.0 in | Wt 261.0 lb

## 2022-06-02 DIAGNOSIS — R051 Acute cough: Secondary | ICD-10-CM | POA: Diagnosis not present

## 2022-06-02 DIAGNOSIS — J40 Bronchitis, not specified as acute or chronic: Secondary | ICD-10-CM | POA: Diagnosis not present

## 2022-06-02 DIAGNOSIS — J398 Other specified diseases of upper respiratory tract: Secondary | ICD-10-CM | POA: Diagnosis not present

## 2022-06-02 DIAGNOSIS — M1A9XX0 Chronic gout, unspecified, without tophus (tophi): Secondary | ICD-10-CM | POA: Diagnosis not present

## 2022-06-02 LAB — POC COVID19 BINAXNOW: SARS Coronavirus 2 Ag: NEGATIVE

## 2022-06-02 MED ORDER — PREDNISONE 20 MG PO TABS: 40.0000 mg | ORAL_TABLET | Freq: Every day | ORAL | 0 refills | Status: AC

## 2022-06-02 MED ORDER — BENZONATATE 200 MG PO CAPS: 200.0000 mg | ORAL_CAPSULE | Freq: Three times a day (TID) | ORAL | 0 refills | Status: DC | PRN

## 2022-06-02 MED ORDER — HYDROCOD POLI-CHLORPHE POLI ER 10-8 MG/5ML PO SUER: 5.0000 mL | Freq: Two times a day (BID) | ORAL | 0 refills | Status: AC | PRN

## 2022-06-02 MED ORDER — GUAIFENESIN ER 600 MG PO TB12: 1200.0000 mg | ORAL_TABLET | Freq: Two times a day (BID) | ORAL | 0 refills | Status: DC

## 2022-06-02 NOTE — Progress Notes (Signed)
Acute Office Visit  Subjective:     Patient ID: David Wall, male    DOB: [birth year], 67 y.o.   MRN: [id number]  Chief Complaint  Patient presents with   Cough     Patient is in today for cough.   Patient reports he started with cough and congestion about 4 days ago. Cough has been productive with yellow sputum. He also has had a day or two of some loose stools. No known sick contacts. He has gotten minimal improvement with NyQuil. He denies chest pain, dyspnea ,wheezing, fevers, chills, body aches rhinorrhea, sneezing, nasal congestion, urinary problems.   Additionally, his left foot gout flared up 2 days ago. States it has been red, swollen, tender to light palpation and very painful with walking. He has been taking his probenecid for prevention. States he tried his PRN colchicine, but did not find much improvement. Denies any spreading redness/warmth or open lesions, drainage.        All review of systems negative except what is listed in the HPI      Objective:    BP (!) 106/54   Pulse 77   Temp 98.2 F (36.8 C) (Oral)   Ht 5\' 8"  (1.727 m)   Wt 261 lb (118.4 kg)   SpO2 96%   BMI 39.68 kg/m    Physical Exam Vitals reviewed.  Constitutional:      Appearance: Normal appearance.  Cardiovascular:     Rate and Rhythm: Normal rate and regular rhythm.     Pulses: Normal pulses.     Heart sounds: Normal heart sounds.  Pulmonary:     Effort: Pulmonary effort is normal.     Breath sounds: Normal breath sounds.  Musculoskeletal:     Comments: Left lateral foot with mild erythema, tenderness, inflammation; no streaking or open lesions  Skin:    General: Skin is warm and dry.  Neurological:     Mental Status: He is alert and oriented to person, place, and time.  Psychiatric:        Mood and Affect: Mood normal.        Behavior: Behavior normal.        Thought Content: Thought content normal.        Judgment: Judgment normal.     Results for orders placed or  performed in visit on 06/02/22  POC COVID-19 BinaxNow  Result Value Ref Range   SARS Coronavirus 2 Ag Negative Negative        Assessment & Plan:   Problem List Items Addressed This Visit     Gout   Relevant Medications   predniSONE (DELTASONE) 20 MG tablet   Other Visit Diagnoses     Acute cough    -  Primary   Relevant Orders   POC COVID-19 BinaxNow (Completed)   Congestion of upper respiratory tract       Relevant Orders   POC COVID-19 BinaxNow (Completed)   Bronchitis       Relevant Medications   predniSONE (DELTASONE) 20 MG tablet   guaiFENesin (MUCINEX) 600 MG 12 hr tablet   chlorpheniramine-HYDROcodone (TUSSIONEX) 10-8 MG/5ML     COVID negative Let's try some prednisone to help with the bronchitis and gout Please start Mucinex and make sure you are staying well hydrated. Adding cough syrup. Continue supportive measures including rest, hydration, humidifier use, steam showers, warm compresses to sinuses, warm liquids with lemon and honey, and over-the-counter cough, cold, and analgesics as needed.   If not  noticing some improvement in the upper respiratory symptoms by the weekend, please let us know.   Please contact office for follow-up if symptoms do not improve or worsen. Seek emergency care if symptoms become severe.   Meds ordered this encounter  Medications   predniSONE (DELTASONE) 20 MG tablet    Sig: Take 2 tablets (40 mg total) by mouth daily with breakfast for 5 days.    Dispense:  10 tablet    Refill:  0    Order Specific Question:   Supervising Provider    Answer:   Danise Edge A [[id number]]   guaiFENesin (MUCINEX) 600 MG 12 hr tablet    Sig: Take 2 tablets (1,200 mg total) by mouth 2 (two) times daily.    Dispense:  30 tablet    Refill:  0    Order Specific Question:   Supervising Provider    Answer:   Danise Edge A [[id number]]   chlorpheniramine-HYDROcodone (TUSSIONEX) 10-8 MG/5ML    Sig: Take 5 mLs by mouth every 12 (twelve) hours as needed for  up to 5 days.    Dispense:  50 mL    Refill:  0    Order Specific Question:   Supervising Provider    Answer:   Danise Edge A [[id number]]    Return if symptoms worsen or fail to improve.  Clayborne Dana, NP

## 2022-06-02 NOTE — Patient Instructions (Signed)
COVID negative Let's try some prednisone to help with the bronchitis and gout Please start Mucinex and make sure you are staying well hydrated Continue supportive measures including rest, hydration, humidifier use, steam showers, warm compresses to sinuses, warm liquids with lemon and honey, and over-the-counter cough, cold, and analgesics as needed.   If not noticing some improvement in the upper respiratory symptoms by the weekend, please let us know.   Please contact office for follow-up if symptoms do not improve or worsen. Seek emergency care if symptoms become severe.

## 2022-06-02 NOTE — Telephone Encounter (Signed)
Spoke with pt, pt is aware

## 2022-06-02 NOTE — Telephone Encounter (Signed)
Pt saw Hyman Hopes today and he had told her that he had enough of the Benzonatate 200 mg pills left but he actually does not have many left. He would like a prescription sent. Please advise.

## 2022-09-04 ENCOUNTER — Ambulatory Visit: Payer: Commercial Managed Care - PPO | Admitting: Surgical

## 2022-09-04 ENCOUNTER — Ambulatory Visit (INDEPENDENT_AMBULATORY_CARE_PROVIDER_SITE_OTHER): Payer: Commercial Managed Care - PPO

## 2022-09-04 DIAGNOSIS — M25561 Pain in right knee: Secondary | ICD-10-CM

## 2022-09-04 NOTE — Progress Notes (Signed)
Office Visit Note   Patient: David Wall           Date of Birth: 01/15/1956           MRN: [id number] Visit Date: 09/04/2022 Requested by: David Bass, FNP [address] [place],  [place] [address] PCP: David Bass, FNP  Subjective: Chief Complaint  Patient presents with   Right Knee - Pain    HPI: SHAWNDALE Wall is a 67 y.o. male who presents to the office reporting right knee pain.  Patient has history of right total knee arthroplasty about 7 to 8 years ago by Dr. Magnus Wall.  He presents today with increased pain mostly localizing to the medial aspect of the right knee for about 2 days.  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  He states that he really had no pain in his knee prior to onset of pain.  Had increased pain with flexing his knee and walking especially on the first day but over the last 48 hours, pain has slowly improved.  He is able to weight-bear without assistance.  No history of injury.  No pain in other joints.  No fevers or chills.  No drainage from the incision.  He has no recent illnesses, hospitalizations, procedures.  Does have some start up pain with pain improving as he walks further distance.  No groin pain.  No low back pain.  No numbness or tingling..                ROS: All systems reviewed are negative as they relate to the chief complaint within the history of present illness.  Patient denies fevers or chills.  Assessment & Plan: Visit Diagnoses:  1. Acute pain of right knee     Plan: Patient is a 67 year old male who presents for evaluation of right knee pain.  Has history of total knee arthroplasty by Dr. Magnus Wall that was performed on 06/15/2014.  Has done well with this up until the last 2 days where he has had some increased pain.  The first day was the worst and pain has  somewhat improved today though still giving him some increased pain with walking.  No fevers or chills or any signs of infection.  Does have a trace effusion on exam today and pain with terminal flexion but aside from that he has fairly painless range of motion.  Radiographs demonstrate no significant acute abnormality.  Plan is to check blood work and patient will follow-up with Dr. Magnus Wall if there is any elevation of lab values.  ESR was found to be 19 and CRP was found to be 27.6.  Plan to have patient follow-up with Dr. Magnus Wall next week.  Follow-Up Instructions: No follow-ups on file.   Orders:  Orders Placed This Encounter  Procedures   XR KNEE 3 VIEW RIGHT   No orders of the defined types were placed in this encounter.     Procedures: No procedures performed   Clinical Data: No additional findings.  Objective: Vital Signs: There were no vitals taken for this visit.  Physical Exam:  Constitutional: Patient appears well-developed HEENT:  Head: Normocephalic Eyes:EOM are normal Neck: Normal range of motion Cardiovascular: Normal rate Pulmonary/chest: Effort normal Neurologic: Patient is alert Skin: Skin is warm Psychiatric: Patient has normal mood and affect  Ortho  Exam: Ortho exam demonstrates right knee with trace effusion.  Incisions well-healed without evidence of infection or dehiscence.  No sinus tract noted.  He is able to perform straight leg raise without extensor lag.  No pain with hip range of motion.  Negative FADIR sign.  Negative Stinchfield sign.  No calf tenderness.  Negative Homans' sign.  No cellulitis or skin changes surrounding the incision.  There is no pain with passive motion of the knee up until terminal flexion.  He has range of motion from about 0 degrees extension to 115 degrees of knee flexion.  Specialty Comments:  No specialty comments available.  Imaging: No results found.   PMFS History: Patient Active Problem List   Diagnosis Date  Noted   Pulmonary nodules 08/01/2021   Olecranon bursitis, left elbow 07/30/2021   Cubital tunnel syndrome, left 12/26/2020   Unilateral primary osteoarthritis, left knee 02/28/2019   Osteoarthritis of right knee 06/15/2014   Status post total right knee replacement 06/15/2014   Sleep disorder 04/25/2014   Inguinal hernia 12/21/2013   Diabetes (HCC) 10/11/2013   Routine general medical examination at a health care facility 10/10/2013   Screening for prostate cancer 10/10/2013   Gout 04/26/2008   RECTAL BLEEDING 04/26/2008   CHEST PAIN UNSPECIFIED 04/25/2007   HYPERCHOLESTEROLEMIA 01/26/2007   UNSPECIFIED CHRONIC ISCHEMIC HEART DISEASE 01/26/2007   CERVICAL RADICULOPATHY, RIGHT 01/26/2007   INSOMNIA 01/26/2007   Past Medical History:  Diagnosis Date   Arthritis    Asthma    hx of in childhood   Diabetes mellitus without complication (HCC)    no tx   GERD (gastroesophageal reflux disease)    Gout    H/O urinary frequency    as noted in H&P per Dr Everardo All 04/25/2014     Family History  Problem Relation Age of Onset   Healthy Mother    Healthy Father    Colon cancer Neg Hx    Esophageal cancer Neg Hx    Stomach cancer Neg Hx    Rectal cancer Neg Hx     Past Surgical History:  Procedure Laterality Date   COLONOSCOPY     EYE SURGERY     laser surgery bilat    JOINT REPLACEMENT     TOTAL KNEE ARTHROPLASTY Right 06/15/2014   Procedure: RIGHT TOTAL KNEE ARTHROPLASTY;  Surgeon: Kathryne Hitch, MD;  Location: [facility];  Service: Orthopedics;  Laterality: Right;   UPPER GI ENDOSCOPY     Social History   Occupational History   Occupation: Cook  Tobacco Use   Smoking status: Never    Passive exposure: Never   Smokeless tobacco: Never  Substance and Sexual Activity   Alcohol use: Yes    Comment: occasional; 1-2 drinks per week per pt   Drug use: No   Sexual activity: Not on file

## 2022-09-05 ENCOUNTER — Encounter: Payer: Self-pay | Admitting: Surgical

## 2022-09-11 ENCOUNTER — Telehealth: Payer: Self-pay

## 2022-09-11 NOTE — Telephone Encounter (Signed)
Patient saw Franky Macho on 09/04/22. Luke ordered labs on patient but they were not resulted due to processing error.  Franky Macho also wanted patient to see Dr Magnus Ivan. I scheduled patient for Monday afternoon to see Dr Magnus Ivan and to have repeat labs drawn

## 2022-09-14 ENCOUNTER — Ambulatory Visit: Payer: Commercial Managed Care - PPO | Admitting: Orthopaedic Surgery

## 2022-09-14 ENCOUNTER — Other Ambulatory Visit: Payer: Self-pay

## 2022-09-14 ENCOUNTER — Other Ambulatory Visit (INDEPENDENT_AMBULATORY_CARE_PROVIDER_SITE_OTHER): Payer: Commercial Managed Care - PPO

## 2022-09-14 DIAGNOSIS — M25511 Pain in right shoulder: Secondary | ICD-10-CM

## 2022-09-14 MED ORDER — CELECOXIB 200 MG PO CAPS
200.0000 mg | ORAL_CAPSULE | Freq: Two times a day (BID) | ORAL | 1 refills | Status: DC | PRN
Start: 2022-09-14 — End: 2023-03-05

## 2022-09-14 MED ORDER — METHYLPREDNISOLONE ACETATE 40 MG/ML IJ SUSP
40.0000 mg | INTRAMUSCULAR | Status: AC | PRN
  Administered 2022-09-14: 40 mg via INTRA_ARTICULAR

## 2022-09-14 MED ORDER — LIDOCAINE HCL 1 % IJ SOLN
3.0000 mL | INTRAMUSCULAR | Status: AC | PRN
  Administered 2022-09-14: 3 mL

## 2022-09-14 NOTE — Progress Notes (Signed)
The patient comes in today for 2 separate issues.  He was following up for right knee pain that had come on acutely.  An x-ray of his knee done a week or so ago showed a normal-appearing knee replacement.  Back to replaced his right knee in May 2016.  He says now the knee is feeling better.  Apparently labs were drawn that day and it showed elevated CRP and sed rate but those were way elevated I think the lab had an error.  He denies any fever and chills and again said his knee feels much better and really has no issues.  But he does come in today for his mainly his right shoulder.  He was lifting a heavy pallet and felt a pop in that shoulder and he has had a difficult time abducting his shoulder and lifting it above his head since then with significant right shoulder pain.  He is 67 years old.  He is never injured that shoulder before or had surgery on his right shoulder.  Examination of his right shoulder shows significant weakness of the rotator cuff with abduction and external rotation.  Examination of his right knee shows no effusion today.  There is no warmth or redness and the range of motion is full and shows no pain.  There is slight laxity of the ligaments but it is only slight.  X-rays of his right shoulder show a small cortical irregularity on the axillary view off of the greater tuberosity of the shoulder and this may be indicative of an acute injury.  I am definitely concerned about him having a full-thickness rotator cuff tear based on his clinical exam findings of that right shoulder.  I did place a steroid injection in the subacromial outlet to help with pain and we will have him on Celebrex twice a day.  It is medically necessary we will order a MRI of this right shoulder to rule out a rotator cuff tear.  Will see him back in follow-up to go over that MRI.  He agrees with this treatment plan.  He did tolerate steroid injection well in the right shoulder today.     Procedure  Note  Patient: David Wall             Date of Birth: 1955/08/17           MRN: [id number]             Visit Date: 09/14/2022  Procedures: Visit Diagnoses:  1. Acute pain of right shoulder     Large Joint Inj: R subacromial bursa on 09/14/2022 3:18 PM Indications: pain and diagnostic evaluation Details: 22 G 1.5 in needle  Arthrogram: No  Medications: 3 mL lidocaine 1 %; 40 mg methylPREDNISolone acetate 40 MG/ML Outcome: tolerated well, no immediate complications Procedure, treatment alternatives, risks and benefits explained, specific risks discussed. Consent was given by the patient. Immediately prior to procedure a time out was called to verify the correct patient, procedure, equipment, support staff and site/side marked as required. Patient was prepped and draped in the usual sterile fashion.

## 2022-09-15 ENCOUNTER — Ambulatory Visit: Payer: Commercial Managed Care - PPO | Admitting: Orthopaedic Surgery

## 2022-10-02 ENCOUNTER — Ambulatory Visit (INDEPENDENT_AMBULATORY_CARE_PROVIDER_SITE_OTHER): Payer: Commercial Managed Care - PPO | Admitting: Family

## 2022-10-02 ENCOUNTER — Ambulatory Visit (HOSPITAL_BASED_OUTPATIENT_CLINIC_OR_DEPARTMENT_OTHER)
Admission: RE | Admit: 2022-10-02 | Discharge: 2022-10-02 | Disposition: A | Discharge status: Home / Self Care | Payer: Commercial Managed Care - PPO | Source: Ambulatory Visit | Attending: Family | Admitting: Family

## 2022-10-02 ENCOUNTER — Encounter: Payer: Self-pay | Admitting: Family

## 2022-10-02 VITALS — BP 122/68 | HR 62 | Ht 68.0 in

## 2022-10-02 DIAGNOSIS — M1A9XX0 Chronic gout, unspecified, without tophus (tophi): Secondary | ICD-10-CM | POA: Diagnosis not present

## 2022-10-02 DIAGNOSIS — R062 Wheezing: Secondary | ICD-10-CM

## 2022-10-02 DIAGNOSIS — E119 Type 2 diabetes mellitus without complications: Secondary | ICD-10-CM | POA: Diagnosis not present

## 2022-10-02 DIAGNOSIS — R7989 Other specified abnormal findings of blood chemistry: Secondary | ICD-10-CM | POA: Diagnosis not present

## 2022-10-02 DIAGNOSIS — Z7984 Long term (current) use of oral hypoglycemic drugs: Secondary | ICD-10-CM

## 2022-10-02 DIAGNOSIS — R0609 Other forms of dyspnea: Secondary | ICD-10-CM | POA: Diagnosis not present

## 2022-10-02 LAB — CBC WITH DIFFERENTIAL/PLATELET
Basophils Absolute: 0.1 10*3/uL (ref 0.0–0.1)
Basophils Relative: 0.9 % (ref 0.0–3.0)
Eosinophils Absolute: 0.3 10*3/uL (ref 0.0–0.7)
Eosinophils Relative: 3.7 % (ref 0.0–5.0)
HCT: 41 % (ref 39.0–52.0)
Hemoglobin: 13.4 g/dL (ref 13.0–17.0)
Lymphocytes Relative: 20.3 % (ref 12.0–46.0)
Lymphs Abs: 1.4 10*3/uL (ref 0.7–4.0)
MCHC: 32.6 g/dL (ref 30.0–36.0)
MCV: 89.4 fl (ref 78.0–100.0)
Monocytes Absolute: 0.5 10*3/uL (ref 0.1–1.0)
Monocytes Relative: 7.5 % (ref 3.0–12.0)
Neutro Abs: 4.8 10*3/uL (ref 1.4–7.7)
Neutrophils Relative %: 67.6 % (ref 43.0–77.0)
Platelets: 262 10*3/uL (ref 150.0–400.0)
RBC: 4.58 Mil/uL (ref 4.22–5.81)
RDW: 13.4 % (ref 11.5–15.5)
WBC: 7.1 10*3/uL (ref 4.0–10.5)

## 2022-10-02 LAB — COMPREHENSIVE METABOLIC PANEL
ALT: 21 U/L (ref 0–53)
AST: 21 U/L (ref 0–37)
Albumin: 4 g/dL (ref 3.5–5.2)
Alkaline Phosphatase: 56 U/L (ref 39–117)
BUN: 21 mg/dL (ref 6–23)
CO2: 25 meq/L (ref 19–32)
Calcium: 9 mg/dL (ref 8.4–10.5)
Chloride: 105 meq/L (ref 96–112)
Creatinine, Ser: 0.99 mg/dL (ref 0.40–1.50)
GFR: 80.04 mL/min (ref >60.00)
Glucose, Bld: 84 mg/dL (ref 70–99)
Potassium: 3.8 meq/L (ref 3.5–5.1)
Sodium: 137 meq/L (ref 135–145)
Total Bilirubin: 0.3 mg/dL (ref 0.2–1.2)
Total Protein: 6.6 g/dL (ref 6.0–8.3)

## 2022-10-02 LAB — D-DIMER, QUANTITATIVE: D-Dimer, Quant: 0.29 ug{FEU}/mL (ref <0.50)

## 2022-10-02 LAB — URIC ACID: Uric Acid, Serum: 7.3 mg/dL (ref 4.0–7.8)

## 2022-10-02 LAB — VITAMIN B12: Vitamin B-12: 430 pg/mL (ref 211–911)

## 2022-10-02 LAB — HEMOGLOBIN A1C: Hgb A1c MFr Bld: 6.7 % — ABNORMAL HIGH (ref 4.6–6.5)

## 2022-10-02 MED ORDER — PREDNISONE 20 MG PO TABS: 20.0000 mg | ORAL_TABLET | Freq: Every day | ORAL | 0 refills | Status: DC

## 2022-10-02 MED ORDER — DOXYCYCLINE HYCLATE 100 MG PO TABS: 100.0000 mg | ORAL_TABLET | Freq: Two times a day (BID) | ORAL | 0 refills | Status: DC

## 2022-10-02 MED ORDER — METFORMIN HCL 500 MG PO TABS
500.0000 mg | ORAL_TABLET | Freq: Every day | ORAL | Status: DC
Start: 2022-10-02 — End: 2022-11-17

## 2022-10-02 NOTE — Progress Notes (Signed)
David Wall is a 67 y.o. male with the following history as recorded in EpicCare:  Patient Active Problem List   Diagnosis Date Noted   Pulmonary nodules 08/01/2021   Olecranon bursitis, left elbow 07/30/2021   Cubital tunnel syndrome, left 12/26/2020   Unilateral primary osteoarthritis, left knee 02/28/2019   Osteoarthritis of right knee 06/15/2014   Status post total right knee replacement 06/15/2014   Sleep disorder 04/25/2014   Inguinal hernia 12/21/2013   Diabetes (HCC) 10/11/2013   Routine general medical examination at a health care facility 10/10/2013   Screening for prostate cancer 10/10/2013   Gout 04/26/2008   RECTAL BLEEDING 04/26/2008   CHEST PAIN UNSPECIFIED 04/25/2007   HYPERCHOLESTEROLEMIA 01/26/2007   UNSPECIFIED CHRONIC ISCHEMIC HEART DISEASE 01/26/2007   CERVICAL RADICULOPATHY, RIGHT 01/26/2007   INSOMNIA 01/26/2007    Current Outpatient Medications  Medication Sig Dispense Refill   benzonatate (TESSALON) 200 MG capsule Take 1 capsule (200 mg total) by mouth 3 (three) times daily as needed for cough. 30 capsule 0   colchicine 0.6 MG tablet Take two tablets as one dose for gout flare as needed followed one hour later by one tablet. No more than three tablets each day. 30 tablet 1   doxycycline (VIBRA-TABS) 100 MG tablet Take 1 tablet (100 mg total) by mouth 2 (two) times daily. 14 tablet 0   gabapentin (NEURONTIN) 300 MG capsule Take 1 capsule (300 mg total) by mouth 3 (three) times daily as needed. 90 capsule 3   omeprazole (PRILOSEC) 20 MG capsule Take 1 capsule (20 mg total) by mouth 2 (two) times daily before a meal. 180 capsule 3   predniSONE (DELTASONE) 20 MG tablet Take 1 tablet (20 mg total) by mouth daily with breakfast. 5 tablet 0   probenecid (BENEMID) 500 MG tablet Take 1 tablet (500 mg total) by mouth 2 (two) times daily. 180 tablet 3   rosuvastatin (CRESTOR) 10 MG tablet TAKE 1 TABLET BY MOUTH EVERY DAY 90 tablet 1   sildenafil (VIAGRA) 100 MG  tablet Take 0.5-1 tablets (50-100 mg total) by mouth daily as needed for erectile dysfunction. 10 tablet 1   tamsulosin (FLOMAX) 0.4 MG CAPS capsule TAKE 1 CAPSULE BY MOUTH EVERY DAY 90 capsule 1   celecoxib (CELEBREX) 200 MG capsule Take 1 capsule (200 mg total) by mouth 2 (two) times daily between meals as needed. (Patient not taking: Reported on 10/02/2022) 60 capsule 1   guaiFENesin (MUCINEX) 600 MG 12 hr tablet Take 2 tablets (1,200 mg total) by mouth 2 (two) times daily. (Patient not taking: Reported on 10/02/2022) 30 tablet 0   metFORMIN (GLUCOPHAGE) 500 MG tablet Take 1 tablet (500 mg total) by mouth daily after supper.     No current facility-administered medications for this visit.    Allergies: Amoxicillin, Aspirin, Penicillins, and Allopurinol  Past Medical History:  Diagnosis Date   Arthritis    Asthma    hx of in childhood   Diabetes mellitus without complication (HCC)    no tx   GERD (gastroesophageal reflux disease)    Gout    H/O urinary frequency    as noted in H&P per Dr Everardo All 04/25/2014     Past Surgical History:  Procedure Laterality Date   COLONOSCOPY     EYE SURGERY     laser surgery bilat    JOINT REPLACEMENT     TOTAL KNEE ARTHROPLASTY Right 06/15/2014   Procedure: RIGHT TOTAL KNEE ARTHROPLASTY;  Surgeon: Kathryne Hitch, MD;  Location: [facility];  Service: Orthopedics;  Laterality: Right;   UPPER GI ENDOSCOPY      Family History  Problem Relation Age of Onset   Healthy Mother    Healthy Father    Colon cancer Neg Hx    Esophageal cancer Neg Hx    Stomach cancer Neg Hx    Rectal cancer Neg Hx     Social History   Tobacco Use   Smoking status: Never    Passive exposure: Never   Smokeless tobacco: Never  Substance Use Topics   Alcohol use: Yes    Comment: occasional; 1-2 drinks per week per pt    Subjective:   Patient had TURP in March 2024 with Dr. Alvester Morin at [facility]; was out of work for about 4 weeks; released by urology after 4  weeks; Concerned about persisting fatigue for the past 4-5 months- "just feel drained" with activity- working 3rd shift/ 7 hours (broken sleep); 9 pm-5:30 am; has worked 3rd shift for 14 years;   Also mentions lingering cough x 2-3 weeks; was sick with URI but symptoms persisting; wife and grandchild had symptoms initially as well;    Objective:  Vitals:   10/02/22 1009  BP: 122/68  Pulse: 62  SpO2: 98%  Height: 5\' 8"  (1.727 m)    General: Well developed, well nourished, in no acute distress  Skin : Warm and dry.  Head: Normocephalic and atraumatic  Eyes: Sclera and conjunctiva clear; pupils round and reactive to light; extraocular movements intact  Ears: External normal; canals clear; tympanic membranes normal  Oropharynx: Pink, supple. No suspicious lesions  Neck: Supple without thyromegaly, adenopathy  Lungs: Respirations unlabored; wheezing noted in upper lobes CVS exam: normal rate and regular rhythm.  Abdomen: Soft; nontender; nondistended; normoactive bowel sounds; no masses or hepatosplenomegaly  Musculoskeletal: No deformities; no active joint inflammation  Extremities: No edema, cyanosis, clubbing  Vessels: Symmetric bilaterally  Neurologic: Alert and oriented; speech intact; face symmetrical; moves all extremities well; CNII-XII intact without focal deficit  Assessment:  1. Type 2 diabetes mellitus without complication, without long-term current use of insulin (HCC)   2. Low vitamin B12 level   3. Chronic gout without tophus, unspecified cause, unspecified site   4. Wheezing   5. DOE (dyspnea on exertion)     Plan:  Will update labs today; EKG shows sinus rhythm and will update CXR;  Will refer to cardiology for further evaluation- also to consider referral to neurology for sleep evaluation;  For acute wheezing, update CXR and Rx for Doxycycline and Prednisone; follow up to be determined;   No follow-ups on file.  Orders Placed This Encounter  Procedures   DG  Chest 2 View    Standing Status:   Future    Number of Occurrences:   1    Standing Expiration Date:   10/02/2023    Order Specific Question:   Reason for Exam (SYMPTOM  OR DIAGNOSIS REQUIRED)    Answer:   wheezing    Order Specific Question:   Preferred imaging location?    Answer:   [facility]   CBC with Differential/Platelet   Comp Met (CMET)   Hemoglobin A1c   B12   Uric acid   D-Dimer, Quantitative   Ambulatory referral to Cardiology    Referral Priority:   Routine    Referral Type:   Consultation    Referral Reason:   Specialty Services Required    Number of Visits Requested:  1   EKG 12-Lead    Requested Prescriptions   Signed Prescriptions Disp Refills   metFORMIN (GLUCOPHAGE) 500 MG tablet      Sig: Take 1 tablet (500 mg total) by mouth daily after supper.   doxycycline (VIBRA-TABS) 100 MG tablet 14 tablet 0    Sig: Take 1 tablet (100 mg total) by mouth 2 (two) times daily.   predniSONE (DELTASONE) 20 MG tablet 5 tablet 0    Sig: Take 1 tablet (20 mg total) by mouth daily with breakfast.

## 2022-10-02 NOTE — Patient Instructions (Signed)
I am going to update referral to cardiology just to make sure we aren't missing issue that could be causing the fatigue; if that is normal, we will refer to neurology/ sleep specialist;  For the wheezing/ lingering cough, please go downstairs to get CXR on 1st floor; take Doxycycline twice a day x 7 days and prednisone 20 mg daily x 5 days;   You can try holding the Flomax and see how you do- please make sure to verify with Dr. Alvester Morin at follow up in November.

## 2022-10-05 ENCOUNTER — Telehealth: Payer: Self-pay

## 2022-10-05 ENCOUNTER — Ambulatory Visit: Payer: Commercial Managed Care - PPO | Admitting: Orthopaedic Surgery

## 2022-10-05 NOTE — Telephone Encounter (Signed)
Patient states he is returning a call from [facility] nurse. Please call when have a chance.

## 2022-10-06 NOTE — Telephone Encounter (Signed)
Spoke with pt, pt is aware of results and expressed understanding.

## 2022-10-13 ENCOUNTER — Encounter: Payer: Self-pay | Admitting: Orthopaedic Surgery

## 2022-10-17 ENCOUNTER — Ambulatory Visit
Admission: RE | Admit: 2022-10-17 | Discharge: 2022-10-17 | Disposition: A | Discharge status: Home / Self Care | Payer: Commercial Managed Care - PPO | Source: Ambulatory Visit | Attending: Orthopaedic Surgery | Admitting: Orthopaedic Surgery

## 2022-10-17 DIAGNOSIS — M25511 Pain in right shoulder: Secondary | ICD-10-CM

## 2022-10-20 ENCOUNTER — Other Ambulatory Visit: Payer: Self-pay | Admitting: Family

## 2022-10-20 MED ORDER — BENZONATATE 200 MG PO CAPS: 200.0000 mg | ORAL_CAPSULE | Freq: Three times a day (TID) | ORAL | 0 refills | Status: DC | PRN

## 2022-10-30 ENCOUNTER — Other Ambulatory Visit: Payer: Self-pay | Admitting: Orthopaedic Surgery

## 2022-10-30 ENCOUNTER — Telehealth: Payer: Self-pay | Admitting: Orthopaedic Surgery

## 2022-10-30 MED ORDER — DIAZEPAM 5 MG PO TABS: 5.0000 mg | ORAL_TABLET | Freq: Once | ORAL | 0 refills | Status: AC

## 2022-10-30 NOTE — Telephone Encounter (Signed)
Patient called and wanted to know if he could get some medication to keep him calm when getting the MRI. [phone]

## 2022-11-02 ENCOUNTER — Ambulatory Visit: Payer: Commercial Managed Care - PPO | Admitting: Orthopaedic Surgery

## 2022-11-02 NOTE — Telephone Encounter (Signed)
Note opened in error.

## 2022-11-17 ENCOUNTER — Ambulatory Visit: Payer: Commercial Managed Care - PPO | Attending: Cardiology | Admitting: Cardiology

## 2022-11-17 ENCOUNTER — Encounter: Payer: Self-pay | Admitting: Cardiology

## 2022-11-17 ENCOUNTER — Encounter: Payer: Self-pay | Admitting: *Deleted

## 2022-11-17 VITALS — BP 130/74 | HR 73 | Resp 16 | Ht 68.0 in | Wt 261.6 lb

## 2022-11-17 DIAGNOSIS — R0600 Dyspnea, unspecified: Secondary | ICD-10-CM | POA: Diagnosis not present

## 2022-11-17 DIAGNOSIS — R0609 Other forms of dyspnea: Secondary | ICD-10-CM | POA: Insufficient documentation

## 2022-11-17 NOTE — Progress Notes (Signed)
Cardiology Office Note:  .   Date:  11/17/2022  ID:  David Wall, DOB 05-26-1955, MRN [id number] PCP: Olive Bass, FNP  [facility] Providers Cardiologist:  Truett Mainland, MD PCP: Olive Bass, FNP  Chief Complaint  Patient presents with   DOE      History of Present Illness: .    David Wall is a 67 y.o. male with borderline diabetes, obesity, exertional dyspnea  Patient works in Set designer job, previously also worked as a Investment banker, operational.  He has noticed exertional dyspnea with walking distance equivalent to 2 basketball courts, or walking uphill.  He occasionally wakes up at night with shortness of breath.  Denies any leg edema, chest pain.  Blood pressure is controlled.  He has lost 10-15 pounds with improved diet recently.  He is non-smoker, drinks alcohol only occasionally.  He has no known family history of coronary artery disease or heart failure.  Vitals:   11/17/22 1320  BP: 130/74  Pulse: 73  Resp: 16  SpO2: 96%     ROS:  Review of Systems  Cardiovascular:  Positive for dyspnea on exertion. Negative for chest pain, leg swelling, palpitations and syncope.     Studies Reviewed: Marland Kitchen       EKG 11/17/2022: Normal sinus rhythm Normal ECG When compared with ECG of 30-Apr-2007 20:32, No significant change was found    Labs 09/2022: HbA1C 6.7% Rest normal   Physical Exam:   Physical Exam Vitals and nursing note reviewed.  Constitutional:      General: He is not in acute distress. Neck:     Vascular: No JVD.  Cardiovascular:     Rate and Rhythm: Normal rate and regular rhythm.     Heart sounds: Normal heart sounds. No murmur heard. Pulmonary:     Effort: Pulmonary effort is normal.     Breath sounds: Normal breath sounds. No wheezing or rales.  Musculoskeletal:     Right lower leg: No edema.     Left lower leg: No edema.      VISIT DIAGNOSES:   ICD-10-CM   1. Exertional dyspnea  R06.09 EKG 12-Lead       ASSESSMENT  AND PLAN: .    David Wall is a 67 y.o. male with  borderline diabetes, obesity, exertional dyspnea  Exertional dyspnea: Normal physical exam, and EKG.  Differentials include deconditioning, obesity, heart failure, CAD. Recommend exercise nuclear stress test and echocardiogram, along with proBNP. If above tests normal, I suspect noncardiac cause.  Hyperlipidemia: Lipids very well-controlled  Informed Consent   Shared Decision Making/Informed Consent{ The risks [chest pain, shortness of breath, cardiac arrhythmias, dizziness, blood pressure fluctuations, myocardial infarction, stroke/transient ischemic attack, nausea, vomiting, allergic reaction, radiation exposure, metallic taste sensation and life-threatening complications (estimated to be 1 in 10,000)], benefits (risk stratification, diagnosing coronary artery disease, treatment guidance) and alternatives of a nuclear stress test were discussed in detail with David Wall and he agrees to proceed.       F/u as needed  Signed, Elder Negus, MD

## 2022-11-17 NOTE — Patient Instructions (Signed)
Medication Instructions:   Your physician recommends that you continue on your current medications as directed. Please refer to the Current Medication list given to you today.  *If you need a refill on your cardiac medications before your next appointment, please call your pharmacy*   Lab Work:  TODAY--PRO-BNP  If you have labs (blood work) drawn today and your tests are completely normal, you will receive your results only by: MyChart Message (if you have MyChart) OR A paper copy in the mail If you have any lab test that is abnormal or we need to change your treatment, we will call you to review the results.   Testing/Procedures:  Your physician has requested that you have an echocardiogram. Echocardiography is a painless test that uses sound waves to create images of your heart. It provides your doctor with information about the size and shape of your heart and how well your heart's chambers and valves are working. This procedure takes approximately one hour. There are no restrictions for this procedure. Please do NOT wear cologne, perfume, aftershave, or lotions (deodorant is allowed). Please arrive 15 minutes prior to your appointment time.   Your physician has requested that you have en exercise stress myoview. For further information please visit [email or web address] Please follow instruction sheet, as given.    Follow-Up:  AS NEEDED WITH DR. PATWARDHAN

## 2022-11-18 LAB — PRO B NATRIURETIC PEPTIDE: NT-Pro BNP: <36 pg/mL (ref 0–376)

## 2022-11-20 ENCOUNTER — Encounter (HOSPITAL_COMMUNITY): Payer: Self-pay

## 2022-11-25 NOTE — Telephone Encounter (Signed)
Spoke with patient and he was given detailed instructions for his STRESS TEST on 11/27/22 at 10:30.

## 2022-11-27 ENCOUNTER — Ambulatory Visit (HOSPITAL_COMMUNITY): Payer: Commercial Managed Care - PPO | Attending: Cardiology

## 2022-11-27 DIAGNOSIS — R0600 Dyspnea, unspecified: Secondary | ICD-10-CM | POA: Insufficient documentation

## 2022-11-27 DIAGNOSIS — R0609 Other forms of dyspnea: Secondary | ICD-10-CM | POA: Diagnosis present

## 2022-11-27 LAB — MYOCARDIAL PERFUSION IMAGING
Base ST Depression (mm): 0 mm
Estimated workload: 7
Exercise duration (min): 6 min
Exercise duration (sec): 0 s
LV dias vol: 101 mL (ref 62–150)
LV sys vol: 35 mL
MPHR: 155 {beats}/min
Nuc Stress EF: 65 %
Peak HR: 150 {beats}/min
Percent HR: 96 %
Rest HR: 59 {beats}/min
Rest Nuclear Isotope Dose: 8.6 mCi
SDS: 0
SRS: 0
SSS: 0
ST Depression (mm): 0 mm
Stress Nuclear Isotope Dose: 26 mCi
TID: 0.85

## 2022-11-27 MED ORDER — TECHNETIUM TC 99M TETROFOSMIN IV KIT
26.0000 | PACK | Freq: Once | INTRAVENOUS | Status: AC | PRN
  Administered 2022-11-27: 26 via INTRAVENOUS

## 2022-11-27 MED ORDER — TECHNETIUM TC 99M TETROFOSMIN IV KIT
8.6000 | PACK | Freq: Once | INTRAVENOUS | Status: AC | PRN
  Administered 2022-11-27: 8.6 via INTRAVENOUS

## 2022-11-27 NOTE — Progress Notes (Signed)
No significant heart muscle circulation abnormalities noted on stress test.  Regards, Dr. Rosemary Holms

## 2022-12-16 ENCOUNTER — Ambulatory Visit (HOSPITAL_COMMUNITY): Payer: Commercial Managed Care - PPO | Attending: Cardiology

## 2022-12-16 ENCOUNTER — Encounter (HOSPITAL_COMMUNITY): Payer: Self-pay | Admitting: Cardiology

## 2023-01-29 ENCOUNTER — Ambulatory Visit (HOSPITAL_COMMUNITY): Payer: Commercial Managed Care - PPO | Attending: Cardiovascular Disease

## 2023-01-29 DIAGNOSIS — R0609 Other forms of dyspnea: Secondary | ICD-10-CM | POA: Diagnosis present

## 2023-01-29 DIAGNOSIS — R0600 Dyspnea, unspecified: Secondary | ICD-10-CM | POA: Diagnosis not present

## 2023-01-29 LAB — ECHOCARDIOGRAM COMPLETE: S' Lateral: 3.91 cm

## 2023-03-05 ENCOUNTER — Other Ambulatory Visit: Payer: Self-pay | Admitting: Orthopaedic Surgery

## 2023-04-09 ENCOUNTER — Ambulatory Visit: Admitting: Family

## 2023-04-09 ENCOUNTER — Encounter: Payer: Self-pay | Admitting: Family

## 2023-04-09 VITALS — BP 130/72 | HR 65 | Ht 68.0 in | Wt 280.2 lb

## 2023-04-09 DIAGNOSIS — Z23 Encounter for immunization: Secondary | ICD-10-CM | POA: Diagnosis not present

## 2023-04-09 DIAGNOSIS — Z860101 Personal history of adenomatous and serrated colon polyps: Secondary | ICD-10-CM

## 2023-04-09 DIAGNOSIS — E119 Type 2 diabetes mellitus without complications: Secondary | ICD-10-CM

## 2023-04-09 DIAGNOSIS — Z0000 Encounter for general adult medical examination without abnormal findings: Secondary | ICD-10-CM

## 2023-04-09 DIAGNOSIS — K219 Gastro-esophageal reflux disease without esophagitis: Secondary | ICD-10-CM

## 2023-04-09 DIAGNOSIS — Z125 Encounter for screening for malignant neoplasm of prostate: Secondary | ICD-10-CM | POA: Diagnosis not present

## 2023-04-09 DIAGNOSIS — Z13220 Encounter for screening for lipoid disorders: Secondary | ICD-10-CM

## 2023-04-09 DIAGNOSIS — Z7984 Long term (current) use of oral hypoglycemic drugs: Secondary | ICD-10-CM

## 2023-04-09 LAB — MICROALBUMIN / CREATININE URINE RATIO
Creatinine,U: 152.8 mg/dL
Microalb Creat Ratio: UNDETERMINED mg/g (ref 0.0–30.0)
Microalb, Ur: <0.7 mg/dL

## 2023-04-09 LAB — LIPID PANEL
Cholesterol: 164 mg/dL (ref 0–200)
HDL: 22.9 mg/dL — ABNORMAL LOW (ref >39.00)
NonHDL: 140.6
Total CHOL/HDL Ratio: 7
Triglycerides: 871 mg/dL — ABNORMAL HIGH (ref 0.0–149.0)
VLDL: 174.2 mg/dL — ABNORMAL HIGH (ref 0.0–40.0)

## 2023-04-09 LAB — CBC WITH DIFFERENTIAL/PLATELET
Basophils Absolute: 0.1 10*3/uL (ref 0.0–0.1)
Basophils Relative: 0.9 % (ref 0.0–3.0)
Eosinophils Absolute: 0.3 10*3/uL (ref 0.0–0.7)
Eosinophils Relative: 4.9 % (ref 0.0–5.0)
HCT: 39.2 % (ref 39.0–52.0)
Hemoglobin: 13.7 g/dL (ref 13.0–17.0)
Lymphocytes Relative: 23.1 % (ref 12.0–46.0)
Lymphs Abs: 1.6 10*3/uL (ref 0.7–4.0)
MCHC: 34.9 g/dL (ref 30.0–36.0)
MCV: 90.6 fl (ref 78.0–100.0)
Monocytes Absolute: 0.4 10*3/uL (ref 0.1–1.0)
Monocytes Relative: 6.5 % (ref 3.0–12.0)
Neutro Abs: 4.4 10*3/uL (ref 1.4–7.7)
Neutrophils Relative %: 64.6 % (ref 43.0–77.0)
Platelets: 307 10*3/uL (ref 150.0–400.0)
RBC: 4.33 Mil/uL (ref 4.22–5.81)
RDW: 13.1 % (ref 11.5–15.5)
WBC: 6.8 10*3/uL (ref 4.0–10.5)

## 2023-04-09 LAB — COMPREHENSIVE METABOLIC PANEL
ALT: 23 U/L (ref 0–53)
AST: 23 U/L (ref 0–37)
Albumin: 4.3 g/dL (ref 3.5–5.2)
Alkaline Phosphatase: 62 U/L (ref 39–117)
BUN: 18 mg/dL (ref 6–23)
CO2: 24 meq/L (ref 19–32)
Calcium: 9.2 mg/dL (ref 8.4–10.5)
Chloride: 105 meq/L (ref 96–112)
Creatinine, Ser: 0.97 mg/dL (ref 0.40–1.50)
GFR: 81.73 mL/min (ref >60.00)
Glucose, Bld: 156 mg/dL — ABNORMAL HIGH (ref 70–99)
Potassium: 3.9 meq/L (ref 3.5–5.1)
Sodium: 138 meq/L (ref 135–145)
Total Bilirubin: 0.2 mg/dL (ref 0.2–1.2)
Total Protein: 6.7 g/dL (ref 6.0–8.3)

## 2023-04-09 LAB — HEMOGLOBIN A1C: Hgb A1c MFr Bld: 7.8 % — ABNORMAL HIGH (ref 4.6–6.5)

## 2023-04-09 LAB — LDL CHOLESTEROL, DIRECT: Direct LDL: 62 mg/dL

## 2023-04-09 LAB — PSA: PSA: 0.52 ng/mL (ref 0.10–4.00)

## 2023-04-09 MED ORDER — TAMSULOSIN HCL 0.4 MG PO CAPS: 0.4000 mg | ORAL_CAPSULE | Freq: Every day | ORAL | 3 refills | Status: AC

## 2023-04-09 MED ORDER — OMEPRAZOLE 20 MG PO CPDR: 20.0000 mg | DELAYED_RELEASE_CAPSULE | Freq: Every day | ORAL | 3 refills | Status: AC

## 2023-04-09 MED ORDER — COLCHICINE 0.6 MG PO TABS: ORAL_TABLET | ORAL | 1 refills | Status: AC

## 2023-04-09 MED ORDER — PROBENECID 500 MG PO TABS: 500.0000 mg | ORAL_TABLET | Freq: Two times a day (BID) | ORAL | 3 refills | Status: AC

## 2023-04-09 NOTE — Progress Notes (Signed)
 David Wall is a 68 y.o. male with the following history as recorded in EpicCare:  Patient Active Problem List   Diagnosis Date Noted   Exertional dyspnea 11/17/2022   Pulmonary nodules 08/01/2021   Olecranon bursitis, left elbow 07/30/2021   Cubital tunnel syndrome, left 12/26/2020   Unilateral primary osteoarthritis, left knee 02/28/2019   Osteoarthritis of right knee 06/15/2014   Status post total right knee replacement 06/15/2014   Sleep disorder 04/25/2014   Inguinal hernia 12/21/2013   Diabetes (HCC) 10/11/2013   Routine general medical examination at a health care facility 10/10/2013   Screening for prostate cancer 10/10/2013   Gout 04/26/2008   RECTAL BLEEDING 04/26/2008   CHEST PAIN UNSPECIFIED 04/25/2007   HYPERCHOLESTEROLEMIA 01/26/2007   UNSPECIFIED CHRONIC ISCHEMIC HEART DISEASE 01/26/2007   CERVICAL RADICULOPATHY, RIGHT 01/26/2007   INSOMNIA 01/26/2007    Current Outpatient Medications  Medication Sig Dispense Refill   celecoxib (CELEBREX) 200 MG capsule TAKE 1 CAPSULE (200 MG TOTAL) BY MOUTH 2 (TWO) TIMES DAILY BETWEEN MEALS AS NEEDED. 60 capsule 1   colchicine 0.6 MG tablet Take two tablets as one dose for gout flare as needed followed one hour later by one tablet. No more than three tablets each day. 30 tablet 1   omeprazole (PRILOSEC) 20 MG capsule Take 1 capsule (20 mg total) by mouth daily. 90 capsule 3   probenecid (BENEMID) 500 MG tablet Take 1 tablet (500 mg total) by mouth 2 (two) times daily. 180 tablet 3   tamsulosin (FLOMAX) 0.4 MG CAPS capsule Take 1 capsule (0.4 mg total) by mouth daily. 90 capsule 3   No current facility-administered medications for this visit.    Allergies: Amoxicillin, Aspirin, Penicillins, and Allopurinol  Past Medical History:  Diagnosis Date   Arthritis    Asthma    hx of in childhood   Bilateral inguinal hernia    Cubital tunnel syndrome on left    Diabetes mellitus without complication (HCC)    no tx   GERD  (gastroesophageal reflux disease)    Gout    Gout    H/O urinary frequency    as noted in H&P per Dr Everardo All 04/25/2014    Heart disease    Hypercholesterolemia    Insomnia    Olecranon bursitis    Osteoarthritis of both knees    Pulmonary nodules    Radiculopathy    Rectal bleed     Past Surgical History:  Procedure Laterality Date   COLONOSCOPY     EYE SURGERY     laser surgery bilat    JOINT REPLACEMENT     TOTAL KNEE ARTHROPLASTY Right 06/15/2014   Procedure: RIGHT TOTAL KNEE ARTHROPLASTY;  Surgeon: Kathryne Hitch, MD;  Location: [facility];  Service: Orthopedics;  Laterality: Right;   UPPER GI ENDOSCOPY      Family History  Problem Relation Age of Onset   Healthy Mother    Healthy Father    Colon cancer Neg Hx    Esophageal cancer Neg Hx    Stomach cancer Neg Hx    Rectal cancer Neg Hx     Social History   Tobacco Use   Smoking status: Never    Passive exposure: Never   Smokeless tobacco: Never  Substance Use Topics   Alcohol use: Yes    Comment: occasional; 1-2 drinks per week per pt    Subjective:   Presents for yearly CPE; no acute concerns today;   Review of Systems  Constitutional: Negative.  HENT: Negative.    Eyes: Negative.   Respiratory: Negative.    Cardiovascular: Negative.   Gastrointestinal: Negative.   Genitourinary: Negative.   Musculoskeletal: Negative.   Skin: Negative.   Neurological: Negative.   Endo/Heme/Allergies: Negative.   Psychiatric/Behavioral: Negative.       Objective:  Vitals:   04/09/23 1055  BP: 130/72  Pulse: 65  SpO2: 96%  Weight: 280 lb 3.2 oz (127.1 kg)  Height: 5\' 8"  (1.727 m)    General: Well developed, well nourished, in no acute distress  Skin : Warm and dry.  Head: Normocephalic and atraumatic  Eyes: Sclera and conjunctiva clear; pupils round and reactive to light; extraocular movements intact  Ears: External normal; canals clear; tympanic membranes normal  Oropharynx: Pink, supple. No  suspicious lesions  Neck: Supple without thyromegaly, adenopathy  Lungs: Respirations unlabored; clear to auscultation bilaterally without wheeze, rales, rhonchi  CVS exam: normal rate and regular rhythm.  Abdomen: Soft; nontender; nondistended; normoactive bowel sounds; no masses or hepatosplenomegaly  Musculoskeletal: No deformities; no active joint inflammation  Extremities: No edema, cyanosis, clubbing  Vessels: Symmetric bilaterally  Neurologic: Alert and oriented; speech intact; face symmetrical; moves all extremities well; CNII-XII intact without focal deficit   Assessment:  1. PE (physical exam), annual   2. Lipid screening   3. Type 2 diabetes mellitus without complication, without long-term current use of insulin (HCC)   4. Prostate cancer screening   5. Encounter for colonoscopy due to history of adenomatous colonic polyps   6. Gastroesophageal reflux disease, unspecified whether esophagitis present   7. Need for pneumococcal 20-valent conjugate vaccination     Plan:  Age appropriate preventive healthcare needs addressed; encouraged regular eye doctor and dental exams; encouraged regular exercise and weight loss; will update labs and refills as needed today; follow-up in 6 months, sooner prn.  Referral for colonoscopy updated; Prevnar 20 given today;    No follow-ups on file.  Orders Placed This Encounter  Procedures   Pneumococcal conjugate vaccine 20-valent (Prevnar 20)   CBC with Differential/Platelet   Comp Met (CMET)   Lipid panel   Hemoglobin A1c   PSA   Urine Microalbumin w/creat. ratio   Ambulatory referral to Gastroenterology    Referral Priority:   Routine    Referral Type:   Consultation    Referral Reason:   Specialty Services Required    Referred to Provider:   Napoleon Form, MD    Number of Visits Requested:   1    Requested Prescriptions   Signed Prescriptions Disp Refills   tamsulosin (FLOMAX) 0.4 MG CAPS capsule 90 capsule 3    Sig: Take  1 capsule (0.4 mg total) by mouth daily.   probenecid (BENEMID) 500 MG tablet 180 tablet 3    Sig: Take 1 tablet (500 mg total) by mouth 2 (two) times daily.   omeprazole (PRILOSEC) 20 MG capsule 90 capsule 3    Sig: Take 1 capsule (20 mg total) by mouth daily.   colchicine 0.6 MG tablet 30 tablet 1    Sig: Take two tablets as one dose for gout flare as needed followed one hour later by one tablet. No more than three tablets each day.

## 2023-04-12 ENCOUNTER — Other Ambulatory Visit: Payer: Self-pay | Admitting: Family

## 2023-04-12 DIAGNOSIS — E782 Mixed hyperlipidemia: Secondary | ICD-10-CM

## 2023-04-12 MED ORDER — METFORMIN HCL 500 MG PO TABS: 500.0000 mg | ORAL_TABLET | Freq: Two times a day (BID) | ORAL | 1 refills | Status: DC

## 2023-04-15 ENCOUNTER — Other Ambulatory Visit: Payer: Self-pay | Admitting: Family

## 2023-04-15 MED ORDER — FENOFIBRATE 145 MG PO TABS: 145.0000 mg | ORAL_TABLET | Freq: Every day | ORAL | 1 refills | Status: DC

## 2023-04-23 ENCOUNTER — Encounter: Payer: Self-pay | Admitting: Gastroenterology

## 2023-06-09 ENCOUNTER — Ambulatory Visit: Payer: Self-pay

## 2023-06-09 ENCOUNTER — Ambulatory Visit (INDEPENDENT_AMBULATORY_CARE_PROVIDER_SITE_OTHER): Admitting: Physician Assistant

## 2023-06-09 ENCOUNTER — Encounter: Payer: Self-pay | Admitting: Physician Assistant

## 2023-06-09 VITALS — BP 104/67 | HR 65 | Temp 97.9°F | Ht 68.0 in | Wt 276.6 lb

## 2023-06-09 DIAGNOSIS — R051 Acute cough: Secondary | ICD-10-CM

## 2023-06-09 LAB — POC COVID19 BINAXNOW: SARS Coronavirus 2 Ag: NEGATIVE

## 2023-06-09 MED ORDER — ALBUTEROL SULFATE HFA 108 (90 BASE) MCG/ACT IN AERS: 2.0000 | INHALATION_SPRAY | Freq: Four times a day (QID) | RESPIRATORY_TRACT | 0 refills | Status: DC | PRN

## 2023-06-09 MED ORDER — PROMETHAZINE-DM 6.25-15 MG/5ML PO SYRP: 5.0000 mL | ORAL_SOLUTION | Freq: Four times a day (QID) | ORAL | 0 refills | Status: AC | PRN

## 2023-06-09 MED ORDER — PREDNISONE 20 MG PO TABS: 20.0000 mg | ORAL_TABLET | Freq: Every day | ORAL | 0 refills | Status: AC

## 2023-06-09 NOTE — Progress Notes (Signed)
 Established patient visit   Patient: David Wall   DOB: 1955/04/14   68 y.o. Male  MRN: [id number] Visit Date: 06/09/2023  Today's healthcare provider: Trenton Frock, PA-C   Cc. cough  Subjective     Pt reports starting Monday, coughing, w/ spasms, dry cough,  causing belly pain, headache. Denies fevers. Not taking anything OTC but reports using a tessalon  pearls rx at home.   Medications: Outpatient Medications Prior to Visit  Medication Sig   celecoxib  (CELEBREX ) 200 MG capsule TAKE 1 CAPSULE (200 MG TOTAL) BY MOUTH 2 (TWO) TIMES DAILY BETWEEN MEALS AS NEEDED.   colchicine  0.6 MG tablet Take two tablets as one dose for gout flare as needed followed one hour later by one tablet. No more than three tablets each day.   fenofibrate  (TRICOR ) 145 MG tablet Take 1 tablet (145 mg total) by mouth daily.   metFORMIN  (GLUCOPHAGE ) 500 MG tablet Take 1 tablet (500 mg total) by mouth 2 (two) times daily with a meal.   omeprazole  (PRILOSEC) 20 MG capsule Take 1 capsule (20 mg total) by mouth daily.   probenecid  (BENEMID ) 500 MG tablet Take 1 tablet (500 mg total) by mouth 2 (two) times daily.   tamsulosin  (FLOMAX ) 0.4 MG CAPS capsule Take 1 capsule (0.4 mg total) by mouth daily.   No facility-administered medications prior to visit.    Review of Systems  Constitutional:  Negative for fatigue and fever.  Respiratory:  Positive for cough. Negative for shortness of breath.   Cardiovascular:  Negative for chest pain, palpitations and leg swelling.  Neurological:  Negative for dizziness and headaches.       Objective    BP 104/67   Pulse 65   Temp 97.9 F (36.6 C)   Ht 5\' 8"  (1.727 m)   Wt 276 lb 9.6 oz (125.5 kg)   SpO2 97%   BMI 42.06 kg/m    Physical Exam Constitutional:      General: He is awake.     Appearance: He is well-developed.  HENT:     Head: Normocephalic.  Eyes:     Conjunctiva/sclera: Conjunctivae normal.  Cardiovascular:     Rate and Rhythm: Normal  rate and regular rhythm.     Heart sounds: Normal heart sounds.  Pulmonary:     Effort: Pulmonary effort is normal.     Breath sounds: Wheezing present.  Skin:    General: Skin is warm.  Neurological:     Mental Status: He is alert and oriented to person, place, and time.  Psychiatric:        Attention and Perception: Attention normal.        Mood and Affect: Mood normal.        Speech: Speech normal.        Behavior: Behavior is cooperative.     Results for orders placed or performed in visit on 06/09/23  POC COVID-19 BinaxNow  Result Value Ref Range   SARS Coronavirus 2 Ag Negative Negative    Assessment & Plan    Acute cough -     POC COVID-19 BinaxNow -     Promethazine-DM; Take 5 mLs by mouth 4 (four) times daily as needed.  Dispense: 118 mL; Refill: 0 -     predniSONE ; Take 1 tablet (20 mg total) by mouth daily with breakfast.  Dispense: 5 tablet; Refill: 0 -     Albuterol Sulfate HFA; Inhale 2 puffs into the lungs every 6 (six)  hours as needed for wheezing or shortness of breath.  Dispense: 8 g; Refill: 0  Poc covid test negative  Mild wheezing at the end of coughing fit. No wheezing at rest.  Rx albuterol inhaler for chest tightness, wheezing, SOB. Rx prednisone  20 mg x 5 days. Rx cough syrup If symptoms do not improve over the next 5-7 days f/b with office.   Return if symptoms worsen or fail to improve.       Trenton Frock, PA-C  [facility] [phone] (phone) [phone] (fax)  [facility]

## 2023-06-09 NOTE — Telephone Encounter (Signed)
 Chief Complaint: cough Symptoms: congestion, mucus, chest discomfort w/ coughing Frequency: since yesterday Pertinent Negatives: Patient denies fever, CP without coughing, dizziness, palpitations, vomiting Disposition: [] ED /[] Urgent Care (no appt availability in office) / [x] Appointment(In office/virtual)/ []  [facility]/ [] Home Care/ [] Refused Recommended Disposition /[] [facility]/ []  Follow-up with PCP Additional Notes: RN spoke to pt's wife. Pt was in the background assisting with triager's questions. Pt has had a productive cough with yellow/green mucus since yesterday which has worsened overnight. Pt reports 8/10 chest discomfort but only with coughing. Pt reports difficulty breathing only with cough. RN scheduled pt for today at 1320. RN advised wife and pt if he develops CP or SOB at rest or without coughing to go to the ED. All verbalized understanding.    Copied from CRM [id number]. Topic: Clinical - Red Word Triage >> Jun 09, 2023  9:42 AM Clydene Darner H wrote: Red Word that prompted transfer to Nurse Triage:Patient's wife called reporting that the patient began experiencing a cough yesterday, which has worsened overnight. She stated the patient is now feeling congested and is experiencing chest discomfort. Reason for Disposition  [1] Chest pain(s) lasting a few seconds from coughing AND [2] persists > 3 days  Answer Assessment - Initial Assessment Questions 1. LOCATION: "Where does it hurt?"       Chest, only with coughing 2. RADIATION: "Does the pain go anywhere else?" (e.g., into neck, jaw, arms, back)     Neck  3. ONSET: "When did the chest pain begin?" (Minutes, hours or days)      Yesterday with coughing  4. PATTERN: "Does the pain come and go, or has it been constant since it started?"  "Does it get worse with exertion?"      Comes and goes w/ coughing  5. DURATION: "How long does it last" (e.g., seconds, minutes, hours)     When coughing is over 6.  SEVERITY: "How bad is the pain?"  (e.g., Scale 1-10; mild, moderate, or severe)    - MILD (1-3): doesn't interfere with normal activities     - MODERATE (4-7): interferes with normal activities or awakens from sleep    - SEVERE (8-10): excruciating pain, unable to do any normal activities       8/10; only with coughing  7. CARDIAC RISK FACTORS: "Do you have any history of heart problems or risk factors for heart disease?" (e.g., angina, prior heart attack; diabetes, high blood pressure, high cholesterol, smoker, or strong family history of heart disease)     Ischemic heart disease  8. PULMONARY RISK FACTORS: "Do you have any history of lung disease?"  (e.g., blood clots in lung, asthma, emphysema, birth control pills)     Pulmonary nodules 9. CAUSE: "What do you think is causing the chest pain?"     Coughing  10. OTHER SYMPTOMS: "Do you have any other symptoms?" (e.g., dizziness, nausea, vomiting, sweating, fever, difficulty breathing, cough)       Yellow/green mucus. Congestion. Headache, cough. "Some difficulty breathing just with coughing." Pt took a med for coughing yesterday.  Protocols used: Chest Pain-A-AH

## 2023-06-21 ENCOUNTER — Encounter

## 2023-07-06 ENCOUNTER — Other Ambulatory Visit: Payer: Self-pay | Admitting: Physician Assistant

## 2023-07-06 DIAGNOSIS — R051 Acute cough: Secondary | ICD-10-CM

## 2023-07-09 ENCOUNTER — Telehealth: Payer: Self-pay

## 2023-07-09 ENCOUNTER — Encounter

## 2023-07-09 NOTE — Telephone Encounter (Signed)
 Patient called wanting to reschedule pre op call since he missed it. Rescheduled the call for 6/25 at 1pm.  Please advise  Thank you

## 2023-07-09 NOTE — Telephone Encounter (Signed)
 Multiple attempts made via telephone to reach patient to complete scheduled phone appointment for pre-visit, all unsuccessful.  RN left VM informing patient he will need to call back before the end of the day today and reschedule his previsit appointment or his colonoscopy will be canceled per [facility] policy.

## 2023-07-09 NOTE — Telephone Encounter (Signed)
 Noted! Thank you

## 2023-07-14 ENCOUNTER — Encounter: Payer: Self-pay | Admitting: Gastroenterology

## 2023-07-14 ENCOUNTER — Ambulatory Visit (AMBULATORY_SURGERY_CENTER)

## 2023-07-14 VITALS — Ht 68.0 in | Wt 285.0 lb

## 2023-07-14 DIAGNOSIS — Z860100 Personal history of colon polyps, unspecified: Secondary | ICD-10-CM

## 2023-07-14 MED ORDER — NA SULFATE-K SULFATE-MG SULF 17.5-3.13-1.6 GM/177ML PO SOLN: 1.0000 | Freq: Once | ORAL | 0 refills | Status: AC

## 2023-07-14 NOTE — Progress Notes (Signed)

## 2023-07-16 ENCOUNTER — Encounter: Admitting: Gastroenterology

## 2023-07-20 ENCOUNTER — Encounter: Payer: Self-pay | Admitting: Gastroenterology

## 2023-07-20 ENCOUNTER — Ambulatory Visit: Admitting: Gastroenterology

## 2023-07-20 VITALS — BP 116/57 | HR 54 | Temp 98.1°F | Resp 13 | Ht 68.0 in | Wt 285.0 lb

## 2023-07-20 DIAGNOSIS — D120 Benign neoplasm of cecum: Secondary | ICD-10-CM

## 2023-07-20 DIAGNOSIS — D122 Benign neoplasm of ascending colon: Secondary | ICD-10-CM | POA: Diagnosis not present

## 2023-07-20 DIAGNOSIS — K644 Residual hemorrhoidal skin tags: Secondary | ICD-10-CM | POA: Diagnosis not present

## 2023-07-20 DIAGNOSIS — D123 Benign neoplasm of transverse colon: Secondary | ICD-10-CM

## 2023-07-20 DIAGNOSIS — K635 Polyp of colon: Secondary | ICD-10-CM | POA: Diagnosis not present

## 2023-07-20 DIAGNOSIS — Z860100 Personal history of colon polyps, unspecified: Secondary | ICD-10-CM

## 2023-07-20 DIAGNOSIS — K648 Other hemorrhoids: Secondary | ICD-10-CM | POA: Diagnosis not present

## 2023-07-20 DIAGNOSIS — Z1211 Encounter for screening for malignant neoplasm of colon: Secondary | ICD-10-CM | POA: Diagnosis present

## 2023-07-20 MED ORDER — SODIUM CHLORIDE 0.9 % IV SOLN: 500.0000 mL | Freq: Once | INTRAVENOUS | Status: DC

## 2023-07-20 NOTE — Op Note (Signed)
 [facility] Patient Name: David Wall Procedure Date: 07/20/2023 12:45 PM MRN: [id number] Endoscopist: Gustav ALONSO Mcgee , MD, [id number] Age: 68 Referring MD:  Date of Birth: Nov 19, 1955 Gender: Male Account #: [id number] Procedure:                Colonoscopy Indications:              High risk colon cancer surveillance: Personal                            history of multiple (3 or more) adenomas Medicines:                Monitored Anesthesia Care Procedure:                Pre-Anesthesia Assessment:                           - Prior to the procedure, a History and Physical                            was performed, and patient medications and                            allergies were reviewed. The patient's tolerance of                            previous anesthesia was also reviewed. The risks                            and benefits of the procedure and the sedation                            options and risks were discussed with the patient.                            All questions were answered, and informed consent                            was obtained. Prior Anticoagulants: The patient has                            taken no anticoagulant or antiplatelet agents. ASA                            Grade Assessment: III - A patient with severe                            systemic disease. After reviewing the risks and                            benefits, the patient was deemed in satisfactory                            condition to undergo the procedure.  After obtaining informed consent, the colonoscope                            was passed under direct vision. Throughout the                            procedure, the patient's blood pressure, pulse, and                            oxygen saturations were monitored continuously. The                            Olympus Scope [id number] was introduced through the                            anus and  advanced to the the cecum, identified by                            appendiceal orifice and ileocecal valve. The                            colonoscopy was performed without difficulty. The                            patient tolerated the procedure well. The quality                            of the bowel preparation was good. The ileocecal                            valve, appendiceal orifice, and rectum were                            photographed. Scope In: 1:49:02 PM Scope Out: 2:07:30 PM Scope Withdrawal Time: 0 hours 16 minutes 37 seconds  Total Procedure Duration: 0 hours 18 minutes 28 seconds  Findings:                 The perianal and digital rectal examinations were                            normal.                           Eight sessile polyps were found in the transverse                            colon x 5, ascending colon x2 and cecum x1. The                            polyps were 3 to 7 mm in size. These polyps were                            removed with a cold snare. Resection and retrieval  were complete.                           Non-bleeding external and internal hemorrhoids were                            found during retroflexion. The hemorrhoids were                            medium-sized. Complications:            No immediate complications. Estimated Blood Loss:     Estimated blood loss was minimal. Impression:               - Eight 3 to 7 mm polyps in the transverse colon,                            in the ascending colon and in the cecum, removed                            with a cold snare. Resected and retrieved.                           - Non-bleeding external and internal hemorrhoids. Recommendation:           - Patient has a contact number available for                            emergencies. The signs and symptoms of potential                            delayed complications were discussed with the                             patient. Return to normal activities tomorrow.                            Written discharge instructions were provided to the                            patient.                           - Resume previous diet.                           - Continue present medications.                           - Await pathology results.                           - Repeat colonoscopy in 3 years for surveillance                            based on pathology results. Zarinah Oviatt V. Won Kreuzer, MD 07/20/2023 2:17:58 PM This report has been signed electronically.

## 2023-07-20 NOTE — Progress Notes (Unsigned)
 Sedate, gd SR, tolerated procedure well, VSS, report to RN

## 2023-07-20 NOTE — Patient Instructions (Addendum)
 Resume previous diet Continue present medications Await pathology results Repeat colonoscopy in 3 years    YOU HAD AN ENDOSCOPIC PROCEDURE TODAY AT THE [facility]:   Refer to the procedure report that was given to you for any specific questions about what was found during the examination.  If the procedure report does not answer your questions, please call your gastroenterologist to clarify.  If you requested that your care partner not be given the details of your procedure findings, then the procedure report has been included in a sealed envelope for you to review at your convenience later.  YOU SHOULD EXPECT: Some feelings of bloating in the abdomen. Passage of more gas than usual.  Walking can help get rid of the air that was put into your GI tract during the procedure and reduce the bloating. If you had a lower endoscopy (such as a colonoscopy or flexible sigmoidoscopy) you may notice spotting of blood in your stool or on the toilet paper. If you underwent a bowel prep for your procedure, you may not have a normal bowel movement for a few days.  Please Note:  You might notice some irritation and congestion in your nose or some drainage.  This is from the oxygen used during your procedure.  There is no need for concern and it should clear up in a day or so.  SYMPTOMS TO REPORT IMMEDIATELY:  Following lower endoscopy (colonoscopy or flexible sigmoidoscopy):  Excessive amounts of blood in the stool  Significant tenderness or worsening of abdominal pains  Swelling of the abdomen that is new, acute  Fever of 100F or higher   For urgent or emergent issues, a gastroenterologist can be reached at any hour by calling [phone]. Do not use MyChart messaging for urgent concerns.    DIET:  We do recommend a small meal at first, but then you may proceed to your regular diet.  Drink plenty of fluids but you should avoid alcoholic beverages for 24 hours.  ACTIVITY:  You should  plan to take it easy for the rest of today and you should NOT DRIVE or use heavy machinery until tomorrow (because of the sedation medicines used during the test).    FOLLOW UP: Our staff will call the number listed on your records the next business day following your procedure.  We will call around 7:15- 8:00 am to check on you and address any questions or concerns that you may have regarding the information given to you following your procedure. If we do not reach you, we will leave a message.     If any biopsies were taken you will be contacted by phone or by letter within the next 1-3 weeks.  Please call us  at [phone] if you have not heard about the biopsies in 3 weeks.    SIGNATURES/CONFIDENTIALITY: You and/or your care partner have signed paperwork which will be entered into your electronic medical record.  These signatures attest to the fact that that the information above on your After Visit Summary has been reviewed and is understood.  Full responsibility of the confidentiality of this discharge information lies with you and/or your care-partner.

## 2023-07-20 NOTE — Progress Notes (Unsigned)
 [facility] History and Physical   Primary Care Physician:  Jason Leita Repine, FNP   Reason for Procedure:  History of adenomatous colon polyps  Plan:    Surveillance colonoscopy with possible interventions as needed     HPI: David Wall is a very pleasant 68 y.o. male here for surveillance colonoscopy. Denies any nausea, vomiting, abdominal pain, melena or bright red blood per rectum  The risks and benefits as well as alternatives of endoscopic procedure(s) have been discussed and reviewed. All questions answered. The patient agrees to proceed.    Past Medical History:  Diagnosis Date   Arthritis    Asthma    hx of in childhood   Bilateral inguinal hernia    Cubital tunnel syndrome on left    Diabetes mellitus without complication (HCC)    no tx   GERD (gastroesophageal reflux disease)    Gout    Gout    H/O urinary frequency    as noted in H&P per Dr Kassie 04/25/2014    Heart disease    Hypercholesterolemia    Insomnia    Olecranon bursitis    Osteoarthritis of both knees    Pulmonary nodules    Radiculopathy    Rectal bleed     Past Surgical History:  Procedure Laterality Date   COLONOSCOPY     EYE SURGERY     laser surgery bilat    JOINT REPLACEMENT     TOTAL KNEE ARTHROPLASTY Right 06/15/2014   Procedure: RIGHT TOTAL KNEE ARTHROPLASTY;  Surgeon: Lonni CINDERELLA Poli, MD;  Location: [facility];  Service: Orthopedics;  Laterality: Right;   UPPER GI ENDOSCOPY      Prior to Admission medications   Medication Sig Start Date End Date Taking? Authorizing Provider  albuterol  (VENTOLIN  HFA) 108 (90 Base) MCG/ACT inhaler Inhale 2 puffs into the lungs every 6 (six) hours as needed for wheezing or shortness of breath. 07/06/23   Jason Leita Repine, FNP  celecoxib  (CELEBREX ) 200 MG capsule TAKE 1 CAPSULE (200 MG TOTAL) BY MOUTH 2 (TWO) TIMES DAILY BETWEEN MEALS AS NEEDED. 03/05/23   Poli Lonni CINDERELLA, MD  colchicine  0.6 MG tablet Take two  tablets as one dose for gout flare as needed followed one hour later by one tablet. No more than three tablets each day. Patient taking differently: Take 0.6 mg by mouth daily. Take two tablets as one dose for gout flare as needed followed one hour later by one tablet. No more than three tablets each day. 04/09/23   Jason Leita Repine, FNP  fenofibrate  (TRICOR ) 145 MG tablet Take 1 tablet (145 mg total) by mouth daily. 04/15/23   Jason Leita Repine, FNP  metFORMIN  (GLUCOPHAGE ) 500 MG tablet Take 1 tablet (500 mg total) by mouth 2 (two) times daily with a meal. Patient taking differently: Take 500 mg by mouth daily. 04/12/23   Jason Leita Repine, FNP  omeprazole  (PRILOSEC) 20 MG capsule Take 1 capsule (20 mg total) by mouth daily. 04/09/23   Jason Leita Repine, FNP  predniSONE  (DELTASONE ) 20 MG tablet Take 1 tablet (20 mg total) by mouth daily with breakfast. 06/09/23   Drubel, Manuelita, PA-C  probenecid  (BENEMID ) 500 MG tablet Take 1 tablet (500 mg total) by mouth 2 (two) times daily. Patient taking differently: Take 500 mg by mouth daily. 04/09/23   Jason Leita Repine, FNP  promethazine -dextromethorphan (PROMETHAZINE -DM) 6.25-15 MG/5ML syrup Take 5 mLs by mouth 4 (four) times daily as needed. Patient not taking: No sig reported 06/09/23  Cyndi Shaver, PA-C  tamsulosin  (FLOMAX ) 0.4 MG CAPS capsule Take 1 capsule (0.4 mg total) by mouth daily. 04/09/23   Jason Leita Repine, FNP    Current Outpatient Medications  Medication Sig Dispense Refill   albuterol  (VENTOLIN  HFA) 108 (90 Base) MCG/ACT inhaler Inhale 2 puffs into the lungs every 6 (six) hours as needed for wheezing or shortness of breath. 18 g 5   celecoxib  (CELEBREX ) 200 MG capsule TAKE 1 CAPSULE (200 MG TOTAL) BY MOUTH 2 (TWO) TIMES DAILY BETWEEN MEALS AS NEEDED. 60 capsule 1   colchicine  0.6 MG tablet Take two tablets as one dose for gout flare as needed followed one hour later by one tablet. No more than three tablets  each day. (Patient taking differently: Take 0.6 mg by mouth daily. Take two tablets as one dose for gout flare as needed followed one hour later by one tablet. No more than three tablets each day.) 30 tablet 1   fenofibrate  (TRICOR ) 145 MG tablet Take 1 tablet (145 mg total) by mouth daily. 90 tablet 1   metFORMIN  (GLUCOPHAGE ) 500 MG tablet Take 1 tablet (500 mg total) by mouth 2 (two) times daily with a meal. (Patient taking differently: Take 500 mg by mouth daily.) 180 tablet 1   omeprazole  (PRILOSEC) 20 MG capsule Take 1 capsule (20 mg total) by mouth daily. 90 capsule 3   predniSONE  (DELTASONE ) 20 MG tablet Take 1 tablet (20 mg total) by mouth daily with breakfast. 5 tablet 0   probenecid  (BENEMID ) 500 MG tablet Take 1 tablet (500 mg total) by mouth 2 (two) times daily. (Patient taking differently: Take 500 mg by mouth daily.) 180 tablet 3   promethazine -dextromethorphan (PROMETHAZINE -DM) 6.25-15 MG/5ML syrup Take 5 mLs by mouth 4 (four) times daily as needed. (Patient not taking: No sig reported) 118 mL 0   tamsulosin  (FLOMAX ) 0.4 MG CAPS capsule Take 1 capsule (0.4 mg total) by mouth daily. 90 capsule 3   Current Facility-Administered Medications  Medication Dose Route Frequency Provider Last Rate Last Admin   0.9 %  sodium chloride  infusion  500 mL Intravenous Once Almando Brawley V, MD        Allergies as of 07/20/2023 - Review Complete 07/20/2023  Allergen Reaction Noted   Amoxicillin Shortness Of Breath 07/09/2006   Aspirin Shortness Of Breath 07/09/2006   Penicillins Shortness Of Breath 10/24/2012   Allopurinol  Rash 12/20/2013   Crestor  [rosuvastatin ] Other (See Comments) 04/15/2023    Family History  Problem Relation Age of Onset   Healthy Mother    Healthy Father    Colon cancer Neg Hx    Esophageal cancer Neg Hx    Stomach cancer Neg Hx    Rectal cancer Neg Hx     Social History   Socioeconomic History   Marital status: Married    Spouse name: Not on file    Number of children: 8   Years of education: 12   Highest education level: Not on file  Occupational History   Occupation: Cook  Tobacco Use   Smoking status: Never    Passive exposure: Never   Smokeless tobacco: Never  Substance and Sexual Activity   Alcohol use: Yes    Comment: occasional; 1-2 drinks per week per pt   Drug use: No   Sexual activity: Not on file  Other Topics Concern   Not on file  Social History Narrative   Fun: Go to the mountains and relax   Social Drivers of Health  Financial Resource Strain: Not on file  Food Insecurity: Not on file  Transportation Needs: Not on file  Physical Activity: Not on file  Stress: Not on file  Social Connections: Unknown (06/03/2021)   Received from [facility]   Social Network    Social Network: Not on file  Intimate Partner Violence: Unknown (04/25/2021)   Received from [facility]   HITS    Physically Hurt: Not on file    Insult or Talk Down To: Not on file    Threaten Physical Harm: Not on file    Scream or Curse: Not on file    Review of Systems:  All other review of systems negative except as mentioned in the HPI.  Physical Exam: Vital signs in last 24 hours: BP 119/65   Pulse 62   Temp 98.1 F (36.7 C)   Resp 16   Ht 5' 8 (1.727 m)   Wt 285 lb (129.3 kg)   SpO2 100%   BMI 43.33 kg/m  General:   Alert, NAD Lungs:  Clear .   Heart:  Regular rate and rhythm Abdomen:  Soft, nontender and nondistended. Neuro/Psych:  Alert and cooperative. Normal mood and affect. A and O x 3  Reviewed labs, radiology imaging, old records and pertinent past GI work up  Patient is appropriate for planned procedure(s) and anesthesia in an ambulatory setting   K. Veena Raynelle Fujikawa , MD [phone]

## 2023-07-20 NOTE — Progress Notes (Unsigned)
 Called to room to assist during endoscopic procedure.  Patient ID and intended procedure confirmed with present staff. Received instructions for my participation in the procedure from the performing physician.

## 2023-07-20 NOTE — Progress Notes (Unsigned)
 Pt's states no medical or surgical changes since previsit or office visit.

## 2023-07-21 ENCOUNTER — Telehealth: Payer: Self-pay

## 2023-07-21 ENCOUNTER — Encounter: Payer: Self-pay | Admitting: Gastroenterology

## 2023-07-21 NOTE — Telephone Encounter (Signed)
  Follow up Call-     07/20/2023    1:03 PM  Call back number  Post procedure Call Back phone  # [phone]  Permission to leave phone message Yes     Patient questions:  Do you have a fever, pain , or abdominal swelling? No. Pain Score  0 *  Have you tolerated food without any problems? Yes.    Have you been able to return to your normal activities? Yes.    Do you have any questions about your discharge instructions: Diet   No. Medications  No. Follow up visit  No.  Do you have questions or concerns about your Care? No.  Actions: * If pain score is 4 or above: No action needed, pain <4.

## 2023-07-26 LAB — SURGICAL PATHOLOGY

## 2023-08-02 ENCOUNTER — Ambulatory Visit (HOSPITAL_BASED_OUTPATIENT_CLINIC_OR_DEPARTMENT_OTHER): Admitting: Internal Medicine

## 2023-08-02 ENCOUNTER — Encounter (HOSPITAL_BASED_OUTPATIENT_CLINIC_OR_DEPARTMENT_OTHER): Payer: Self-pay | Admitting: Internal Medicine

## 2023-08-02 VITALS — BP 108/62 | HR 94 | Ht 68.0 in | Wt 266.0 lb

## 2023-08-02 DIAGNOSIS — E119 Type 2 diabetes mellitus without complications: Secondary | ICD-10-CM | POA: Diagnosis not present

## 2023-08-02 DIAGNOSIS — E785 Hyperlipidemia, unspecified: Secondary | ICD-10-CM

## 2023-08-02 NOTE — Progress Notes (Signed)
 LIPID CLINIC CONSULT NOTE  Chief Complaint:  Manage dyslipidemia  Primary Care Physician: Jason Leita Repine, FNP  Primary Cardiologist:  None  HPI:  David Wall is a 68 y.o. male who is being seen today for the evaluation of dyslipidemia at the request of Jason Leita Repine,*. This is a 68 year old male kindly referred for evaluation management of dyslipidemia.  Primarily has a history of high triglycerides.  His last lipid profile showed total cholesterol 164 with triglycerides 871 and HDL of 22.  A direct LDL was measured at 62.  He also has a history of type 2 diabetes which initially he said was borderline although he has a history of A1c's well over 6.5% with recent A1c in March at 7.8%.  He does not do regular fingerstick testing.  He is on metformin  500 mg twice daily.  He tries to eat low-fat diet.  He does report other heart disease in the family.  PMHx:  Past Medical History:  Diagnosis Date   Arthritis    Asthma    hx of in childhood   Bilateral inguinal hernia    Cubital tunnel syndrome on left    Diabetes mellitus without complication (HCC)    no tx   GERD (gastroesophageal reflux disease)    Gout    Gout    H/O urinary frequency    as noted in H&P per Dr Kassie 04/25/2014    Heart disease    Hypercholesterolemia    Insomnia    Olecranon bursitis    Osteoarthritis of both knees    Pulmonary nodules    Radiculopathy    Rectal bleed     Past Surgical History:  Procedure Laterality Date   COLONOSCOPY     EYE SURGERY     laser surgery bilat    JOINT REPLACEMENT     TOTAL KNEE ARTHROPLASTY Right 06/15/2014   Procedure: RIGHT TOTAL KNEE ARTHROPLASTY;  Surgeon: Lonni CINDERELLA Poli, MD;  Location: [facility];  Service: Orthopedics;  Laterality: Right;   UPPER GI ENDOSCOPY      FAMHx:  Family History  Problem Relation Age of Onset   Healthy Mother    Healthy Father    Colon cancer Neg Hx    Esophageal cancer Neg Hx    Stomach cancer Neg Hx     Rectal cancer Neg Hx     SOCHx:   reports that he has never smoked. He has never been exposed to tobacco smoke. He has never used smokeless tobacco. He reports current alcohol use. He reports that he does not use drugs.  ALLERGIES:  Allergies  Allergen Reactions   Amoxicillin Shortness Of Breath   Aspirin Shortness Of Breath   Penicillins Shortness Of Breath   Allopurinol  Rash   Crestor  [Rosuvastatin ] Other (See Comments)    Pt could not tolerate due to headaches     ROS: Pertinent items noted in HPI and remainder of comprehensive ROS otherwise negative.  HOME MEDS: Current Outpatient Medications on File Prior to Visit  Medication Sig Dispense Refill   albuterol  (VENTOLIN  HFA) 108 (90 Base) MCG/ACT inhaler Inhale 2 puffs into the lungs every 6 (six) hours as needed for wheezing or shortness of breath. 18 g 5   colchicine  0.6 MG tablet Take two tablets as one dose for gout flare as needed followed one hour later by one tablet. No more than three tablets each day. 30 tablet 1   fenofibrate  (TRICOR ) 145 MG tablet Take 1 tablet (145  mg total) by mouth daily. 90 tablet 1   metFORMIN  (GLUCOPHAGE ) 500 MG tablet Take 1 tablet (500 mg total) by mouth 2 (two) times daily with a meal. 180 tablet 1   omeprazole  (PRILOSEC) 20 MG capsule Take 1 capsule (20 mg total) by mouth daily. 90 capsule 3   probenecid  (BENEMID ) 500 MG tablet Take 1 tablet (500 mg total) by mouth 2 (two) times daily. 180 tablet 3   promethazine -dextromethorphan (PROMETHAZINE -DM) 6.25-15 MG/5ML syrup Take 5 mLs by mouth 4 (four) times daily as needed. 118 mL 0   tamsulosin  (FLOMAX ) 0.4 MG CAPS capsule Take 1 capsule (0.4 mg total) by mouth daily. 90 capsule 3   celecoxib  (CELEBREX ) 200 MG capsule TAKE 1 CAPSULE (200 MG TOTAL) BY MOUTH 2 (TWO) TIMES DAILY BETWEEN MEALS AS NEEDED. (Patient not taking: Reported on 08/02/2023) 60 capsule 1   predniSONE  (DELTASONE ) 20 MG tablet Take 1 tablet (20 mg total) by mouth daily with  breakfast. (Patient not taking: Reported on 08/02/2023) 5 tablet 0   No current facility-administered medications on file prior to visit.    LABS/IMAGING: No results found for this or any previous visit (from the past 48 hours). No results found.  LIPID PANEL:    Component Value Date/Time   CHOL 164 04/09/2023 1139   TRIG (H) 04/09/2023 1139    871.0 Triglyceride is over 400; calculations on Lipids are invalid.   HDL 22.90 (L) 04/09/2023 1139   CHOLHDL 7 04/09/2023 1139   VLDL 174.2 (H) 04/09/2023 1139   LDLCALC 23 04/18/2021 1149   LDLDIRECT 62.0 04/09/2023 1139    No results found for: LIPOA   WEIGHTS: Wt Readings from Last 3 Encounters:  08/02/23 266 lb (120.7 kg)  07/20/23 285 lb (129.3 kg)  07/14/23 285 lb (129.3 kg)    VITALS: BP 108/62 (BP Location: Right Arm, Patient Position: Sitting, Cuff Size: Normal)   Pulse 94   Ht 5' 8 (1.727 m)   Wt 266 lb (120.7 kg)   SpO2 98%   BMI 40.45 kg/m   EXAM: Deferred  EKG: Deferred  ASSESSMENT: Mixed dyslipidemia with high triglycerides, goal LDL less than 70. Type 2 diabetes-uncontrolled, A1c 7.8% Morbid obesity  PLAN: 1.   Mr. Cuffe has a mixed dyslipidemia with high triglycerides.  I suspect genetic triglyceride disorder.  He was reportedly on a statin at some point but does not recall taking it.  He was recently restarted on fenofibrate  145 mg daily.  I would like to recheck his lipids to see if this is improved at some.  I would then likely plan on adding back statin therapy even low-dose to see if it is tolerated and try to target LDL less than 70 given his history of diabetes.  We will also plan to check an LP(a).  I will contact him with regards to his lab work and we will further formulate a treatment plan based on that.  Thanks again for the kind referral.  Vinie KYM Maxcy, MD, [facility], FNLA, FACP  [place]  [facility]  Medical Director of the Advanced Lipid Disorders &  Cardiovascular Risk  Reduction Clinic Diplomate of the American Board of Clinical Lipidology Attending Cardiologist  Direct Dial: [phone]  Fax: [phone]  Website:  [email or web address] Vinie BROCKS Lameeka Schleifer 08/02/2023, 3:15 PM

## 2023-08-02 NOTE — Patient Instructions (Signed)
 Medication Instructions:  Your physician recommends that you continue on your current medications as directed. Please refer to the Current Medication list given to you today.  *If you need a refill on your cardiac medications before your next appointment, please call your pharmacy*  Lab Work: FASTING- NMR, LPa, and Direct LDL as soon as able If you have labs (blood work) drawn today and your tests are completely normal, you will receive your results only by: MyChart Message (if you have MyChart) OR A paper copy in the mail If you have any lab test that is abnormal or we need to change your treatment, we will call you to review the results.   Follow-Up: At [facility], you and your health needs are our priority.  As part of our continuing mission to provide you with exceptional heart care, our providers are all part of one team.  This team includes your primary Cardiologist (physician) and Advanced Practice Providers or APPs (Physician Assistants and Nurse Practitioners) who all work together to provide you with the care you need, when you need it.  Your next appointment:   TBD- based on your lab results

## 2023-08-05 LAB — NMR, LIPOPROFILE
Cholesterol, Total: 172 mg/dL (ref 100–199)
HDL Particle Number: 20.8 umol/L — ABNORMAL LOW (ref >=30.5)
HDL-C: 18 mg/dL — ABNORMAL LOW (ref >39)
LDL Particle Number: 1572 nmol/L — ABNORMAL HIGH (ref <1000)
LDL Size: 19.6 nm — ABNORMAL LOW (ref >20.5)
LDL-C (NIH Calc): 92 mg/dL (ref 0–99)
LP-IR Score: 92 — ABNORMAL HIGH (ref <=45)
Small LDL Particle Number: 1303 nmol/L — ABNORMAL HIGH (ref <=527)
Triglycerides: 368 mg/dL — ABNORMAL HIGH (ref 0–149)

## 2023-08-05 LAB — LDL CHOLESTEROL, DIRECT: LDL Direct: 97 mg/dL (ref 0–99)

## 2023-08-05 LAB — LIPOPROTEIN A (LPA): Lipoprotein (a): 57.6 nmol/L (ref <75.0)

## 2023-08-06 ENCOUNTER — Ambulatory Visit: Payer: Self-pay | Admitting: Internal Medicine

## 2023-08-06 DIAGNOSIS — E7800 Pure hypercholesterolemia, unspecified: Secondary | ICD-10-CM

## 2023-08-10 ENCOUNTER — Other Ambulatory Visit (HOSPITAL_COMMUNITY): Payer: Self-pay

## 2023-08-10 ENCOUNTER — Other Ambulatory Visit: Payer: Self-pay

## 2023-08-10 MED ORDER — ROSUVASTATIN CALCIUM 5 MG PO TABS
5.0000 mg | ORAL_TABLET | Freq: Every day | ORAL | 3 refills | Status: DC
  Filled 2023-08-10: qty 90, 90d supply, fill #0

## 2023-08-19 ENCOUNTER — Encounter: Admitting: Orthopedic Surgery

## 2023-08-20 ENCOUNTER — Other Ambulatory Visit (HOSPITAL_COMMUNITY): Payer: Self-pay

## 2023-08-25 MED ORDER — ROSUVASTATIN CALCIUM 5 MG PO TABS: 5.0000 mg | ORAL_TABLET | Freq: Every day | ORAL | 3 refills | Status: AC

## 2023-09-27 ENCOUNTER — Ambulatory Visit (INDEPENDENT_AMBULATORY_CARE_PROVIDER_SITE_OTHER): Admitting: Orthopaedic Surgery

## 2023-09-27 DIAGNOSIS — G8929 Other chronic pain: Secondary | ICD-10-CM | POA: Diagnosis not present

## 2023-09-27 DIAGNOSIS — M1712 Unilateral primary osteoarthritis, left knee: Secondary | ICD-10-CM

## 2023-09-27 DIAGNOSIS — M25562 Pain in left knee: Secondary | ICD-10-CM | POA: Diagnosis not present

## 2023-09-27 DIAGNOSIS — M25511 Pain in right shoulder: Secondary | ICD-10-CM

## 2023-09-27 MED ORDER — LIDOCAINE HCL 1 % IJ SOLN
3.0000 mL | INTRAMUSCULAR | Status: AC | PRN
  Administered 2023-09-27: 3 mL

## 2023-09-27 MED ORDER — METHYLPREDNISOLONE ACETATE 40 MG/ML IJ SUSP
40.0000 mg | INTRAMUSCULAR | Status: AC | PRN
  Administered 2023-09-27: 40 mg via INTRA_ARTICULAR

## 2023-09-27 NOTE — Progress Notes (Signed)
 The patient is well-known to us .  He is an active 68 year old gentleman who we replaced his right knee in the past.  His left knee does have known arthritis but his right shoulder is been problematic for him for some time now.  We did place a steroid injection just over a year ago and his right shoulder subacromial outlet he said that helped for a while.  We were going to obtain an MRI of the right shoulder but he is significantly claustrophobic and has just put things off.  He says the shoulder is bothering him and the knee is bothering him.  He says his right knee is just a little bit tender but is the left knee that bothers him just a little bit but is not as bad as it was before and he is not ready for knee replacement just yet.  His right shoulder does show signs of impingement with some weakness of the rotator cuff but good motion overall.  His left knee shows no effusion and good range of motion.  I did offer steroid injection in his right shoulder today and he did wish to have 1 today as well.  I do feel that he is a good candidate for outpatient physical therapy for any modalities that can improve his shoulder function on the right side as well as strengthen his left knee.  He agrees with this as well.  Will see him back in about 6 weeks after course of outpatient physical therapy for his right shoulder and left knee.  He did tolerate the steroid injection well in his right shoulder today.    Procedure Note  Patient: David Wall             Date of Birth: Jul 11, 1955           MRN: [id number]             Visit Date: 09/27/2023  Procedures: Visit Diagnoses:  1. Chronic right shoulder pain   2. Chronic pain of left knee   3. Unilateral primary osteoarthritis, left knee     Large Joint Inj: R subacromial bursa on 09/27/2023 2:26 PM Indications: pain and diagnostic evaluation Details: 22 G 1.5 in needle  Arthrogram: No  Medications: 3 mL lidocaine  1 %; 40 mg methylPREDNISolone  acetate  40 MG/ML Outcome: tolerated well, no immediate complications Procedure, treatment alternatives, risks and benefits explained, specific risks discussed. Consent was given by the patient. Immediately prior to procedure a time out was called to verify the correct patient, procedure, equipment, support staff and site/side marked as required. Patient was prepped and draped in the usual sterile fashion.

## 2023-09-28 NOTE — Addendum Note (Signed)
 Addended by: PETER FRIEZE B on: 09/28/2023 11:50 AM   Modules accepted: Orders

## 2023-10-04 ENCOUNTER — Ambulatory Visit (INDEPENDENT_AMBULATORY_CARE_PROVIDER_SITE_OTHER): Admitting: Orthopedic Surgery

## 2023-10-04 ENCOUNTER — Other Ambulatory Visit (INDEPENDENT_AMBULATORY_CARE_PROVIDER_SITE_OTHER)

## 2023-10-04 DIAGNOSIS — M654 Radial styloid tenosynovitis [de Quervain]: Secondary | ICD-10-CM | POA: Diagnosis not present

## 2023-10-04 DIAGNOSIS — M25532 Pain in left wrist: Secondary | ICD-10-CM | POA: Diagnosis not present

## 2023-10-04 DIAGNOSIS — M180 Bilateral primary osteoarthritis of first carpometacarpal joints: Secondary | ICD-10-CM | POA: Diagnosis not present

## 2023-10-04 DIAGNOSIS — M25531 Pain in right wrist: Secondary | ICD-10-CM

## 2023-10-04 MED ORDER — LIDOCAINE HCL 1 % IJ SOLN
1.0000 mL | INTRAMUSCULAR | Status: AC | PRN
  Administered 2023-10-04: 1 mL

## 2023-10-04 MED ORDER — BETAMETHASONE SOD PHOS & ACET 6 (3-3) MG/ML IJ SUSP
6.0000 mg | INTRAMUSCULAR | Status: AC | PRN
  Administered 2023-10-04: 6 mg via INTRA_ARTICULAR

## 2023-10-04 NOTE — Progress Notes (Signed)
 David Wall - 68 y.o. male MRN [id number]  Date of birth: 1955/05/22  Office Visit Note: Visit Date: 10/04/2023 PCP: Jason Leita Repine, FNP Referred by: Jason Leita Repine,*  Subjective: No chief complaint on file.  HPI: David Wall is a pleasant 68 y.o. male who presents today for evaluation of ongoing bilateral basal joint pain at the thumbs with associated radiating pain into the distal thumb and proximal forearm.  States that the right side is worse than the left.  No significant numbness or tingling.  Has not undergone any significant treatments.  He is here today for specific hand surgical evaluation.  Pertinent ROS were reviewed with the patient and found to be negative unless otherwise specified above in HPI.   Visit Reason: Bilateral thumb pain R>L Duration of symptoms: worsened over the last 6 months Hand dominance: right Occupation: Manufactoring Diabetic: No Smoking: No Heart/Lung History: Cardiovascular and Mediastinum UNSPECIFIED CHRONIC ISCHEMIC HEART DISEASE   Respiratory Pulmonary nodules   Blood Thinners: none  Prior Testing/EMG: None Injections (Date): none Treatments: none Prior Surgery: none  Assessment & Plan: Visit Diagnoses:  1. De Quervain's disease (radial styloid tenosynovitis)   2. Pain in left wrist   3. Pain in right wrist   4. Primary osteoarthritis of both first carpometacarpal joints     Plan: Extensive discussion was had with the patient today regarding his bilateral wrist radial sided pain.  Patient has signs and symptoms consistent with de Quervain's tenosynovitis.  We discussed the underlying etiology and pathophysiology of this condition as well as treatment modalities ranging from conservative to surgical.  From a conservative standpoint, we discussed activity modification, anti-inflammatory medication both topical and oral, bracing, therapy and cortisone injections.  From a surgical standpoint, I explained to patient  that we can perform right wrist first extensor compartment release should symptoms remain affected conservative care.  Understanding his options, he would like to proceed with bilateral cortisone injections to the extensor compartments for symptom relief.  Injections were performed today under ultrasound guidance, patient tolerated well.  We also did discuss the underlying basilar thumb CMC arthritis that exist bilaterally both clinically and radiographically.  His symptoms from this are fairly well-controlled at this juncture.  The Comfort Cool brace should help address this problem as well, particular on the right side.  At his follow-up appointment, we can discuss this further.  I spent 45 minutes in the care of this patient today including review of previous documentation, imaging obtained, face-to-face time discussing all options regarding treatment and documenting the encounter.  Follow-up in 6-weeks.   Follow-up: No follow-ups on file.   Meds & Orders: No orders of the defined types were placed in this encounter.   Orders Placed This Encounter  Procedures   Hand/UE Inj   XR Wrist Complete Right   XR Wrist Complete Left     Procedures: Hand/UE Inj: bilateral extensor compartment 1 for de Quervain's tenosynovitis on 10/04/2023 4:02 PM Indications: pain Details: 22 G needle, ultrasound-guided Medications (Right): 1 mL lidocaine  1 %; 6 mg betamethasone  acetate-betamethasone  sodium phosphate 6 (3-3) MG/ML Medications (Left): 1 mL lidocaine  1 %; 6 mg betamethasone  acetate-betamethasone  sodium phosphate 6 (3-3) MG/ML Outcome: tolerated well, no immediate complications  Ultrasound images saved in the [facility] ultrasound database Procedure, treatment alternatives, risks and benefits explained, specific risks discussed. Consent was given by the patient.          Clinical History: No specialty comments available.  He reports that he  has never smoked. He has never been exposed to  tobacco smoke. He has never used smokeless tobacco.  Recent Labs    04/09/23 1139  HGBA1C 7.8*    Objective:   Vital Signs: There were no vitals taken for this visit.  Physical Exam  Gen: Well-appearing, in no acute distress; non-toxic CV: Regular Rate. Well-perfused. Warm.  Resp: Breathing unlabored on room air; no wheezing. Psych: Fluid speech in conversation; appropriate affect; normal thought process  Ortho Exam General: Patient is well appearing and in no distress.    Skin and Muscle: No skin changes are apparent to upper extremities.  Muscle bulk and contour normal, no signs of atrophy.      Range of Motion and Palpation Tests: Mobility is full about the elbows with flexion and extension.  Forearm supination and pronation are 85/85 bilaterally.  Wrist flexion/extension is 75/65 bilaterally.  Digital flexion and extension are full.  Thumb opposition is full to the base of the small fingers bilaterally.     No cords or nodules are palpated.  No triggering is observed.     Significant tenderness over the bilateral thumb CMC articulation is observed, positive grind for pain, positive crepitus.  MP hyperextension negative.    Finklestein test positive bilateral   Neurologic, Vascular, Motor: Sensation is intact to light touch in the median/radial/ulnar distributions.  Tinel's testing negative at wrist level. Phalen's negative bilateral, Derkan's compression negative bilateral.  Fingers pink and well perfused.  Capillary refill is brisk.     Imaging: XR Wrist Complete Left Result Date: 10/04/2023 X-ray of the left wrist demonstrate notable degenerative changes at the thumb [facility] interval with joint space narrowing, osteophyte formation and subchondral sclerosis.  XR Wrist Complete Right Result Date: 10/04/2023 X-rays of the right wrist demonstrate significant degenerative changes at the thumb basilar joint with subluxation, ossified formation and subchondral sclerosis.   Associated degenerative changes at the STT interval are also appreciated.   Past Medical/Family/Surgical/Social History: Medications & Allergies reviewed per EMR, new medications updated. Patient Active Problem List   Diagnosis Date Noted   Exertional dyspnea 11/17/2022   Pulmonary nodules 08/01/2021   Olecranon bursitis, left elbow 07/30/2021   Cubital tunnel syndrome, left 12/26/2020   Unilateral primary osteoarthritis, left knee 02/28/2019   Osteoarthritis of right knee 06/15/2014   Status post total right knee replacement 06/15/2014   Sleep disorder 04/25/2014   Inguinal hernia 12/21/2013   Diabetes (HCC) 10/11/2013   Routine general medical examination at a health care facility 10/10/2013   Screening for prostate cancer 10/10/2013   Gout 04/26/2008   RECTAL BLEEDING 04/26/2008   CHEST PAIN UNSPECIFIED 04/25/2007   HYPERCHOLESTEROLEMIA 01/26/2007   UNSPECIFIED CHRONIC ISCHEMIC HEART DISEASE 01/26/2007   CERVICAL RADICULOPATHY, RIGHT 01/26/2007   INSOMNIA 01/26/2007   Past Medical History:  Diagnosis Date   Arthritis    Asthma    hx of in childhood   Bilateral inguinal hernia    Cubital tunnel syndrome on left    Diabetes mellitus without complication (HCC)    no tx   GERD (gastroesophageal reflux disease)    Gout    Gout    H/O urinary frequency    as noted in H&P per Dr Kassie 04/25/2014    Heart disease    Hypercholesterolemia    Insomnia    Olecranon bursitis    Osteoarthritis of both knees    Pulmonary nodules    Radiculopathy    Rectal bleed    Family History  Problem  Relation Age of Onset   Healthy Mother    Healthy Father    Colon cancer Neg Hx    Esophageal cancer Neg Hx    Stomach cancer Neg Hx    Rectal cancer Neg Hx    Past Surgical History:  Procedure Laterality Date   COLONOSCOPY     EYE SURGERY     laser surgery bilat    JOINT REPLACEMENT     TOTAL KNEE ARTHROPLASTY Right 06/15/2014   Procedure: RIGHT TOTAL KNEE ARTHROPLASTY;  Surgeon:  Lonni CINDERELLA Poli, MD;  Location: [facility];  Service: Orthopedics;  Laterality: Right;   UPPER GI ENDOSCOPY     Social History   Occupational History   Occupation: Cook  Tobacco Use   Smoking status: Never    Passive exposure: Never   Smokeless tobacco: Never  Substance and Sexual Activity   Alcohol use: Yes    Comment: occasional; 1-2 drinks per week per pt   Drug use: No   Sexual activity: Not on file    Swathi Dauphin Estela) Arlinda, M.D. [facility], Hand Surgery

## 2023-10-08 ENCOUNTER — Ambulatory Visit: Admitting: Family

## 2023-10-10 ENCOUNTER — Other Ambulatory Visit: Payer: Self-pay | Admitting: Family

## 2023-10-20 ENCOUNTER — Ambulatory Visit (INDEPENDENT_AMBULATORY_CARE_PROVIDER_SITE_OTHER): Admitting: Rehabilitative and Restorative Service Providers"

## 2023-10-20 ENCOUNTER — Encounter: Payer: Self-pay | Admitting: Rehabilitative and Restorative Service Providers"

## 2023-10-20 DIAGNOSIS — G8929 Other chronic pain: Secondary | ICD-10-CM

## 2023-10-20 DIAGNOSIS — R262 Difficulty in walking, not elsewhere classified: Secondary | ICD-10-CM | POA: Diagnosis not present

## 2023-10-20 DIAGNOSIS — M25562 Pain in left knee: Secondary | ICD-10-CM | POA: Diagnosis not present

## 2023-10-20 DIAGNOSIS — M25511 Pain in right shoulder: Secondary | ICD-10-CM | POA: Diagnosis not present

## 2023-10-20 DIAGNOSIS — M6281 Muscle weakness (generalized): Secondary | ICD-10-CM

## 2023-10-20 NOTE — Therapy (Signed)
 OUTPATIENT PHYSICAL THERAPY EVALUATION   Patient Name: David Wall MRN: [id number] DOB:[birth year], 68 y.o., male Today's Date: 10/20/2023  END OF SESSION:  PT End of Session - 10/20/23 1512     Visit Number 1    Number of Visits 20    Date for Recertification  12/29/23    Authorization Type [employer] UMR 60 VL 10% coinsurance.    PT Start Time 1516    PT Stop Time 1555    PT Time Calculation (min) 39 min    Activity Tolerance Patient tolerated treatment well    Behavior During Therapy WFL for tasks assessed/performed          Past Medical History:  Diagnosis Date   Arthritis    Asthma    hx of in childhood   Bilateral inguinal hernia    Cubital tunnel syndrome on left    Diabetes mellitus without complication (HCC)    no tx   GERD (gastroesophageal reflux disease)    Gout    Gout    H/O urinary frequency    as noted in H&P per Dr Kassie 04/25/2014    Heart disease    Hypercholesterolemia    Insomnia    Olecranon bursitis    Osteoarthritis of both knees    Pulmonary nodules    Radiculopathy    Rectal bleed    Past Surgical History:  Procedure Laterality Date   COLONOSCOPY     EYE SURGERY     laser surgery bilat    JOINT REPLACEMENT     TOTAL KNEE ARTHROPLASTY Right 06/15/2014   Procedure: RIGHT TOTAL KNEE ARTHROPLASTY;  Surgeon: Lonni CINDERELLA Poli, MD;  Location: [facility];  Service: Orthopedics;  Laterality: Right;   UPPER GI ENDOSCOPY     Patient Active Problem List   Diagnosis Date Noted   Exertional dyspnea 11/17/2022   Pulmonary nodules 08/01/2021   Olecranon bursitis, left elbow 07/30/2021   Cubital tunnel syndrome, left 12/26/2020   Unilateral primary osteoarthritis, left knee 02/28/2019   Osteoarthritis of right knee 06/15/2014   Status post total right knee replacement 06/15/2014   Sleep disorder 04/25/2014   Inguinal hernia 12/21/2013   Diabetes (HCC) 10/11/2013   Routine general medical examination at a health care facility 10/10/2013    Screening for prostate cancer 10/10/2013   Gout 04/26/2008   RECTAL BLEEDING 04/26/2008   CHEST PAIN UNSPECIFIED 04/25/2007   HYPERCHOLESTEROLEMIA 01/26/2007   UNSPECIFIED CHRONIC ISCHEMIC HEART DISEASE 01/26/2007   CERVICAL RADICULOPATHY, RIGHT 01/26/2007   INSOMNIA 01/26/2007    PCP: Jason Leita Repine FNP  REFERRING PROVIDER: Poli Lonni CINDERELLA, MD  REFERRING DIAG: [phone] (ICD-10-CM) - Chronic right shoulder pain M25.562,G89.29 (ICD-10-CM) - Chronic pain of left knee M17.12 (ICD-10-CM) - Unilateral primary osteoarthritis, left knee  THERAPY DIAG:  Chronic right shoulder pain  Chronic pain of left knee  Muscle weakness (generalized)  Difficulty in walking, not elsewhere classified  Rationale for Evaluation and Treatment: Rehabilitation  ONSET DATE: About 6 months or so.   SUBJECTIVE:   SUBJECTIVE STATEMENT: Pt indicated having complaints in Rt shoulder develop about 6 months ago, maybe related to moving pallet.  Reported injection initially that helped about 6 months ago and then had one recently.  Also had complaints of Lt knee worsening over time.  Pt indicated Rt shoulder worsening with sleeping on that side, reaching up with arm.  Reported symptoms in superior shoulder, upper arm .  Can be sharp.  Pt indicated Lt knee hurting 5/10 or so,  noted with bending, squatting, getting up.  Reported steps are troublesome.   Rt hand dominant.   PERTINENT HISTORY: Arthritis, asthma, DM, Hypercholesterolemia, OA both knees.   - History of Rt TKA.  Steroid injection 09/27/2023 Rt shoulder  PAIN:  NPRS scale: Rt shoulder at worst 5-7/10, Lt knee at worst 5/10 Pain location: Rt shoulder, Lt knee  Pain description: Rt shoulder (sharp at times) Aggravating factors: lying on Rt shoulder, reaching up with Rt shoulder.  Lt knee: stairs, bending, squatting.  Relieving factors: injection   PRECAUTIONS: None  WEIGHT BEARING RESTRICTIONS: No  FALLS:  Has patient  fallen in last 6 months? 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  Lifting < 50 lbs.   PLOF: Independent.  Fishing, watch sports, Jeep  PATIENT GOALS: Reduce pain   OBJECTIVE:   PATIENT SURVEYS:  Patient-Specific Activity Scoring Scheme  0 represents "unable to perform." 10 represents "able to perform at prior level. 0 1 2 3 4 5 6 7 8 9  10 (Date and Score)   Activity Eval  10/20/2023    1. Stairs   5    2. Reaching arm up  6    3. Bending to lift off floor 5   4.     5.    Score 5.667    Total score = sum of the activity scores/number of activities Minimum detectable change (90%CI) for average score = 2 points Minimum detectable change (90%CI) for single activity score = 3 points  COGNITION: 10/20/2023 Overall cognitive status: WFL    SENSATION: 10/20/2023 Unremarkable   EDEMA:  10/20/2023 Not observed or noted  MUSCLE LENGTH: 10/20/2023 No specific testing  POSTURE:  10/20/2023 Rounded shoulders  PALPATION: 10/20/2023 Trigger points noted in Rt infraspinatus, possible connection to concordant symptoms.   UPPER EXTREMITY ROM:   ROM Right Eval 10/20/2023 Left Eval 10/20/2023  Shoulder flexion 155 AROM in supine.  Mild end range pain   [facility]  Shoulder extension    Shoulder abduction 155 AROM in supine [facility]  Shoulder adduction    Shoulder extension    Shoulder internal rotation 70 AROM in supine 60 deg abduction Tightness noted   Shoulder external rotation 75 AROM in supine 60 deg abduction    Elbow flexion    Elbow extension    Wrist flexion    Wrist extension    Wrist ulnar deviation    Wrist radial deviation    Wrist pronation    Wrist supination     (Blank rows = not tested)  UPPER EXTREMITY MMT:  MMT Right Eval 10/20/2023 Left Eval 10/20/2023  Shoulder flexion 3+/5 c pain 5/5  Shoulder extension    Shoulder abduction 3+/5 c  pain 5/5  Shoulder adduction    Shoulder extension    Shoulder internal rotation 5/5 5/5  Shoulder external rotation 4/5 4/5  Middle trapezius    Lower trapezius    Elbow flexion 5/5 5/5  Elbow extension 5/5 5/5  Wrist flexion    Wrist extension    Wrist ulnar deviation    Wrist radial deviation    Wrist pronation    Wrist supination    Grip strength     (Blank rows = not tested)   LOWER EXTREMITY ROM:   ROM Right Eval 10/20/2023 Left Eval 10/20/2023  Hip flexion    Hip extension    Hip abduction    Hip adduction  Hip internal rotation    Hip external rotation    Knee flexion [facility] WFL compared to Rt knee   Knee extension 0 0  Ankle dorsiflexion    Ankle plantarflexion    Ankle inversion    Ankle eversion     (Blank rows = not tested)  LOWER EXTREMITY MMT:  MMT Right Eval 10/20/2023 Left Eval 10/20/2023  Hip flexion 5/5 5/5  Hip extension    Hip abduction    Hip adduction    Hip internal rotation    Hip external rotation    Knee flexion 5/5 5/5  Knee extension 5/5 72, 67.4 lbs 5/5 52, 53 lbs  Ankle dorsiflexion 5/5 5/5  Ankle plantarflexion    Ankle inversion    Ankle eversion     (Blank rows = not tested)  LOWER EXTREMITY SPECIAL TESTS:  10/20/2023 (-) drop arm Rt, (+) painful arc Rt.   FUNCTIONAL TESTS:  10/20/2023 18 inch chair transfer: s UE assist with deviation to Rt leg.  Lt SLS: < 5 seconds Rt SLS: < 5 seconds  GAIT: 10/20/2023 Independent ambulation                                                                                                                                                                        TODAY'S TREATMENT                                                                          DATE: 10/20/2023 Therex:    HEP instruction/performance c cues for techniques, handout provided.  Trial set performed of each for comprehension and symptom assessment.  See below for exercise list  PATIENT EDUCATION:  Education  details: HEP, POC Person educated: Patient Education method: Explanation, Demonstration, Verbal cues, and Handouts Education comprehension: verbalized understanding, returned demonstration, and verbal cues required  HOME EXERCISE PROGRAM: Access Code: [email or web address] URL: [email or web address] Date: 10/20/2023 Prepared by: Ozell Silvan  Exercises - Sleeper Stretch (Mirrored)  - 2 x daily - 7 x weekly - 1 sets - 5 reps - 30 hold - Standing Shoulder Posterior Capsule Stretch (Mirrored)  - 2 x daily - 7 x weekly - 1 sets - 5 reps - 15-30 hold - Sit to Stand  - 3 x daily - 7 x weekly - 1 sets - 10 reps - Seated Quad Set (Mirrored)  - 2 x daily - 7 x weekly - 1 sets - 10 reps - 5 hold - Seated  SLR (Mirrored)  - 1-2 x daily - 7 x weekly - 1-2 sets - 10-15 reps - 2 hold - Standing Bilateral Low Shoulder Row with Anchored Resistance  - 1-2 x daily - 7 x weekly - 2-3 sets - 10-15 reps - Shoulder Extension with Resistance  - 1-2 x daily - 7 x weekly - 1-2 sets - 10-15 reps - Shoulder External Rotation Reactive Isometrics (Mirrored)  - 1 x daily - 7 x weekly - 1 sets - 10 reps - 5-15 hold  ASSESSMENT:  CLINICAL IMPRESSION: Patient is a 68 y.o. who comes to clinic with complaints of Rt shoulder pain, Lt knee pain with mobility, strength and movement coordination deficits that impair their ability to perform usual daily and recreational functional activities without increase difficulty/symptoms at this time.  Patient to benefit from skilled PT services to address impairments and limitations to improve to previous level of function without restriction secondary to condition.   OBJECTIVE IMPAIRMENTS: decreased activity tolerance, decreased balance, decreased coordination, decreased endurance, decreased mobility, difficulty walking, decreased ROM, decreased strength, increased fascial restrictions, impaired perceived functional ability, impaired flexibility, impaired UE functional use, improper  body mechanics, postural dysfunction, and pain.   ACTIVITY LIMITATIONS: carrying, lifting, bending, sitting, squatting, sleeping, stairs, transfers, bed mobility, reach over head, and locomotion level  PARTICIPATION LIMITATIONS: interpersonal relationship and community activity  PERSONAL FACTORS: Arthritis, asthma, DM, Hypercholesterolemia, OA both knees, multiple treatment areas  are also affecting patient's functional outcome.   REHAB POTENTIAL: Good  CLINICAL DECISION MAKING: Stable/uncomplicated  EVALUATION COMPLEXITY: Low   GOALS: Goals reviewed with patient? Yes  SHORT TERM GOALS: (target date for Short term goals are 3 weeks 11/10/2023)   1.  Patient will demonstrate independent use of home exercise program to maintain progress from in clinic treatments.  Goal status: New  LONG TERM GOALS: (target dates for all long term goals are 10 weeks  12/29/2023 )   1. Patient will demonstrate/report pain at worst less than or equal to 2/10 to facilitate minimal limitation in daily activity secondary to pain symptoms.  Goal status: New   2. Patient will demonstrate independent use of home exercise program to facilitate ability to maintain/progress functional gains from skilled physical therapy services.  Goal status: New   3. Patient will demonstrate Patient specific functional scale avg > or = 8/10 to indicate reduced disability due to condition.   Goal status: New   4.  Patient will demonstrate Lt LE MMT 5/5, knee extension dynamometer Lt knee extension within 10 % of Rt throughout to faciltiate usual transfers, stairs, squatting at PLOF for daily life.   Goal status: New   5.  Patient will demonstrate Rt shoulder MMT 5/5 throughout to facilitate usual reaching, lifting, carrying at PLOF in daily life.  Goal status: New   6.  Patient will demonstrate Rt AROM WFL s symptoms to facilitate usual daily activity and work at [employer].  Goal status: New      PLAN:  PT  FREQUENCY: 1-2x/week  PT DURATION: 10 weeks  PLANNED INTERVENTIONS: Can include 02853- PT Re-evaluation, 97110-Therapeutic exercises, 97530- Therapeutic activity, [id number]- Neuromuscular re-education, 97535- Self Care, 97140- Manual therapy, [phone]- Gait training, [id number]- Orthotic Fit/training, [phone]- Canalith repositioning, [id number]- Aquatic Therapy, [phone]- Electrical stimulation (unattended), [id number] Physical performance testing, 97016- Vasopneumatic device, [id number]- Ultrasound, [id number]- Traction (mechanical), [id number]- Ionotophoresis 4mg /ml Dexamethasone,  20560 - Needle insertion w/o injection 1 or 2 muscles, 20561 - Needle insertion w/o injection 3 or more muscles.   Patient/Family education,  Balance training, Stair training, Taping, Dry Needling, Joint mobilization, Joint manipulation, Spinal manipulation, Spinal mobilization, Scar mobilization, Vestibular training, Visual/preceptual remediation/compensation, DME instructions, Cryotherapy, and Moist heat.  All performed as medically necessary.  All included unless contraindicated  PLAN FOR NEXT SESSION: Review HEP knowledge/results.  Machine leg press, knee extension strengthening.    Ozell Silvan, PT, DPT, OCS, ATC 10/20/23  4:08 PM

## 2023-11-01 NOTE — Therapy (Incomplete)
 OUTPATIENT PHYSICAL THERAPY EVALUATION   Patient Name: David Wall MRN: [id number] DOB:January 06, 1956, 68 y.o., male Today's Date: 11/01/2023  END OF SESSION:    Past Medical History:  Diagnosis Date   Arthritis    Asthma    hx of in childhood   Bilateral inguinal hernia    Cubital tunnel syndrome on left    Diabetes mellitus without complication (HCC)    no tx   GERD (gastroesophageal reflux disease)    Gout    Gout    H/O urinary frequency    as noted in H&P per Dr Kassie 04/25/2014    Heart disease    Hypercholesterolemia    Insomnia    Olecranon bursitis    Osteoarthritis of both knees    Pulmonary nodules    Radiculopathy    Rectal bleed    Past Surgical History:  Procedure Laterality Date   COLONOSCOPY     EYE SURGERY     laser surgery bilat    JOINT REPLACEMENT     TOTAL KNEE ARTHROPLASTY Right 06/15/2014   Procedure: RIGHT TOTAL KNEE ARTHROPLASTY;  Surgeon: Lonni CINDERELLA Poli, MD;  Location: [facility];  Service: Orthopedics;  Laterality: Right;   UPPER GI ENDOSCOPY     Patient Active Problem List   Diagnosis Date Noted   Exertional dyspnea 11/17/2022   Pulmonary nodules 08/01/2021   Olecranon bursitis, left elbow 07/30/2021   Cubital tunnel syndrome, left 12/26/2020   Unilateral primary osteoarthritis, left knee 02/28/2019   Osteoarthritis of right knee 06/15/2014   Status post total right knee replacement 06/15/2014   Sleep disorder 04/25/2014   Inguinal hernia 12/21/2013   Diabetes (HCC) 10/11/2013   Routine general medical examination at a health care facility 10/10/2013   Screening for prostate cancer 10/10/2013   Gout 04/26/2008   RECTAL BLEEDING 04/26/2008   CHEST PAIN UNSPECIFIED 04/25/2007   HYPERCHOLESTEROLEMIA 01/26/2007   UNSPECIFIED CHRONIC ISCHEMIC HEART DISEASE 01/26/2007   CERVICAL RADICULOPATHY, RIGHT 01/26/2007   INSOMNIA 01/26/2007    PCP: Jason Leita Repine FNP  REFERRING PROVIDER: Poli Lonni CINDERELLA,  MD  REFERRING DIAG: [phone] (ICD-10-CM) - Chronic right shoulder pain M25.562,G89.29 (ICD-10-CM) - Chronic pain of left knee M17.12 (ICD-10-CM) - Unilateral primary osteoarthritis, left knee  THERAPY DIAG:  No diagnosis found.  Rationale for Evaluation and Treatment: Rehabilitation  ONSET DATE: About 6 months or so.   SUBJECTIVE:   SUBJECTIVE STATEMENT: ***Pt indicated having complaints in Rt shoulder develop about 6 months ago, maybe related to moving pallet.  Reported injection initially that helped about 6 months ago and then had one recently.  Also had complaints of Lt knee worsening over time.  Pt indicated Rt shoulder worsening with sleeping on that side, reaching up with arm.  Reported symptoms in superior shoulder, upper arm .  Can be sharp.  Pt indicated Lt knee hurting 5/10 or so, noted with bending, squatting, getting up.  Reported steps are troublesome.   Rt hand dominant.   PERTINENT HISTORY: Arthritis, asthma, DM, Hypercholesterolemia, OA both knees.   - History of Rt TKA.  Steroid injection 09/27/2023 Rt shoulder  PAIN:  ***NPRS scale: Rt shoulder at worst 5-7/10, Lt knee at worst 5/10 Pain location: Rt shoulder, Lt knee  Pain description: Rt shoulder (sharp at times) Aggravating factors: lying on Rt shoulder, reaching up with Rt shoulder.  Lt knee: stairs, bending, squatting.  Relieving factors: injection   PRECAUTIONS: None  WEIGHT BEARING RESTRICTIONS: No  FALLS:  Has patient fallen in last  6 months? 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  Lifting < 50 lbs.   PLOF: Independent.  Fishing, watch sports, Jeep  PATIENT GOALS: Reduce pain   OBJECTIVE:   PATIENT SURVEYS:  Patient-Specific Activity Scoring Scheme  0 represents "unable to perform." 10 represents "able to perform at prior level. 0 1 2 3 4 5 6 7 8 9  10 (Date and  Score)   Activity Eval  10/20/2023    1. Stairs   5    2. Reaching arm up  6    3. Bending to lift off floor 5   4.     5.    Score 5.667    Total score = sum of the activity scores/number of activities Minimum detectable change (90%CI) for average score = 2 points Minimum detectable change (90%CI) for single activity score = 3 points  COGNITION: 10/20/2023 Overall cognitive status: WFL    SENSATION: 10/20/2023 Unremarkable   EDEMA:  10/20/2023 Not observed or noted  MUSCLE LENGTH: 10/20/2023 No specific testing  POSTURE:  10/20/2023 Rounded shoulders  PALPATION: 10/20/2023 Trigger points noted in Rt infraspinatus, possible connection to concordant symptoms.   UPPER EXTREMITY ROM:   ROM Right Eval 10/20/2023 Left Eval 10/20/2023  Shoulder flexion 155 AROM in supine.  Mild end range pain   [facility]  Shoulder extension    Shoulder abduction 155 AROM in supine [facility]  Shoulder adduction    Shoulder extension    Shoulder internal rotation 70 AROM in supine 60 deg abduction Tightness noted   Shoulder external rotation 75 AROM in supine 60 deg abduction    Elbow flexion    Elbow extension    Wrist flexion    Wrist extension    Wrist ulnar deviation    Wrist radial deviation    Wrist pronation    Wrist supination     (Blank rows = not tested)  UPPER EXTREMITY MMT:  MMT Right Eval 10/20/2023 Left Eval 10/20/2023  Shoulder flexion 3+/5 c pain 5/5  Shoulder extension    Shoulder abduction 3+/5 c pain 5/5  Shoulder adduction    Shoulder extension    Shoulder internal rotation 5/5 5/5  Shoulder external rotation 4/5 4/5  Middle trapezius    Lower trapezius    Elbow flexion 5/5 5/5  Elbow extension 5/5 5/5  Wrist flexion    Wrist extension    Wrist ulnar deviation    Wrist radial deviation    Wrist pronation    Wrist supination    Grip strength     (Blank rows = not tested)   LOWER EXTREMITY ROM:   ROM Right Eval 10/20/2023 Left Eval 10/20/2023  Hip  flexion    Hip extension    Hip abduction    Hip adduction    Hip internal rotation    Hip external rotation    Knee flexion [facility] WFL compared to Rt knee   Knee extension 0 0  Ankle dorsiflexion    Ankle plantarflexion    Ankle inversion    Ankle eversion     (Blank rows = not tested)  LOWER EXTREMITY MMT:  MMT Right Eval 10/20/2023 Left Eval 10/20/2023  Hip flexion 5/5 5/5  Hip extension    Hip abduction    Hip adduction    Hip internal rotation    Hip external rotation    Knee flexion 5/5 5/5  Knee extension 5/5  72, 67.4 lbs 5/5 52, 53 lbs  Ankle dorsiflexion 5/5 5/5  Ankle plantarflexion    Ankle inversion    Ankle eversion     (Blank rows = not tested)  LOWER EXTREMITY SPECIAL TESTS:  10/20/2023 (-) drop arm Rt, (+) painful arc Rt.   FUNCTIONAL TESTS:  10/20/2023 18 inch chair transfer: s UE assist with deviation to Rt leg.  Lt SLS: < 5 seconds Rt SLS: < 5 seconds  GAIT: 10/20/2023 Independent ambulation                                                                                                                                                                        TODAY'S TREATMENT                                                                          DATE:  11/02/23***R shoulder    10/20/2023 Therex:    HEP instruction/performance c cues for techniques, handout provided.  Trial set performed of each for comprehension and symptom assessment.  See below for exercise list  PATIENT EDUCATION:  Education details: HEP, POC Person educated: Patient Education method: Explanation, Demonstration, Verbal cues, and Handouts Education comprehension: verbalized understanding, returned demonstration, and verbal cues required  HOME EXERCISE PROGRAM: Access Code: [email or web address] URL: [email or web address] Date: 10/20/2023 Prepared by: Ozell Silvan  Exercises - Sleeper Stretch (Mirrored)  - 2 x daily - 7 x weekly - 1 sets - 5 reps - 30 hold -  Standing Shoulder Posterior Capsule Stretch (Mirrored)  - 2 x daily - 7 x weekly - 1 sets - 5 reps - 15-30 hold - Sit to Stand  - 3 x daily - 7 x weekly - 1 sets - 10 reps - Seated Quad Set (Mirrored)  - 2 x daily - 7 x weekly - 1 sets - 10 reps - 5 hold - Seated SLR (Mirrored)  - 1-2 x daily - 7 x weekly - 1-2 sets - 10-15 reps - 2 hold - Standing Bilateral Low Shoulder Row with Anchored Resistance  - 1-2 x daily - 7 x weekly - 2-3 sets - 10-15 reps - Shoulder Extension with Resistance  - 1-2 x daily - 7 x weekly - 1-2 sets - 10-15 reps - Shoulder External Rotation Reactive Isometrics (Mirrored)  - 1 x daily - 7 x weekly - 1 sets - 10 reps - 5-15 hold  ASSESSMENT:  CLINICAL IMPRESSION: ***Patient is a 68 y.o. who  comes to clinic with complaints of Rt shoulder pain, Lt knee pain with mobility, strength and movement coordination deficits that impair their ability to perform usual daily and recreational functional activities without increase difficulty/symptoms at this time.  Patient to benefit from skilled PT services to address impairments and limitations to improve to previous level of function without restriction secondary to condition.   OBJECTIVE IMPAIRMENTS: decreased activity tolerance, decreased balance, decreased coordination, decreased endurance, decreased mobility, difficulty walking, decreased ROM, decreased strength, increased fascial restrictions, impaired perceived functional ability, impaired flexibility, impaired UE functional use, improper body mechanics, postural dysfunction, and pain.   ACTIVITY LIMITATIONS: carrying, lifting, bending, sitting, squatting, sleeping, stairs, transfers, bed mobility, reach over head, and locomotion level  PARTICIPATION LIMITATIONS: interpersonal relationship and community activity  PERSONAL FACTORS: Arthritis, asthma, DM, Hypercholesterolemia, OA both knees, multiple treatment areas  are also affecting patient's functional outcome.   REHAB  POTENTIAL: Good  CLINICAL DECISION MAKING: Stable/uncomplicated  EVALUATION COMPLEXITY: Low   GOALS: Goals reviewed with patient? Yes  SHORT TERM GOALS: (target date for Short term goals are 3 weeks 11/10/2023)***   1.  Patient will demonstrate independent use of home exercise program to maintain progress from in clinic treatments.  Goal status: New  LONG TERM GOALS: (target dates for all long term goals are 10 weeks  12/29/2023 )   1. Patient will demonstrate/report pain at worst less than or equal to 2/10 to facilitate minimal limitation in daily activity secondary to pain symptoms.  Goal status: New   2. Patient will demonstrate independent use of home exercise program to facilitate ability to maintain/progress functional gains from skilled physical therapy services.  Goal status: New   3. Patient will demonstrate Patient specific functional scale avg > or = 8/10 to indicate reduced disability due to condition.   Goal status: New   4.  Patient will demonstrate Lt LE MMT 5/5, knee extension dynamometer Lt knee extension within 10 % of Rt throughout to faciltiate usual transfers, stairs, squatting at PLOF for daily life.   Goal status: New   5.  Patient will demonstrate Rt shoulder MMT 5/5 throughout to facilitate usual reaching, lifting, carrying at PLOF in daily life.  Goal status: New   6.  Patient will demonstrate Rt AROM WFL s symptoms to facilitate usual daily activity and work at [employer].  Goal status: New      PLAN:  PT FREQUENCY: 1-2x/week  PT DURATION: 10 weeks  PLANNED INTERVENTIONS: Can include 02853- PT Re-evaluation, 97110-Therapeutic exercises, 97530- Therapeutic activity, [id number]- Neuromuscular re-education, 97535- Self Care, 97140- Manual therapy, [phone]- Gait training, [phone]- Orthotic Fit/training, [phone]- Canalith repositioning, [id number]- Aquatic Therapy, [phone]- Electrical stimulation (unattended), [id number] Physical performance testing, 97016- Vasopneumatic  device, [id number]- Ultrasound, [id number]- Traction (mechanical), [id number]- Ionotophoresis 4mg /ml Dexamethasone,  20560 - Needle insertion w/o injection 1 or 2 muscles, 20561 - Needle insertion w/o injection 3 or more muscles.   Patient/Family education, Balance training, Stair training, Taping, Dry Needling, Joint mobilization, Joint manipulation, Spinal manipulation, Spinal mobilization, Scar mobilization, Vestibular training, Visual/preceptual remediation/compensation, DME instructions, Cryotherapy, and Moist heat.  All performed as medically necessary.  All included unless contraindicated  PLAN FOR NEXT SESSION: ***Review HEP knowledge/results.  Machine leg press, knee extension strengthening.   Burnard Meth, PT 11/01/23  8:22 AM

## 2023-11-02 ENCOUNTER — Encounter

## 2023-11-08 ENCOUNTER — Encounter: Payer: Self-pay | Admitting: Orthopaedic Surgery

## 2023-11-08 ENCOUNTER — Ambulatory Visit (INDEPENDENT_AMBULATORY_CARE_PROVIDER_SITE_OTHER): Admitting: Orthopaedic Surgery

## 2023-11-08 DIAGNOSIS — M25511 Pain in right shoulder: Secondary | ICD-10-CM

## 2023-11-08 DIAGNOSIS — G8929 Other chronic pain: Secondary | ICD-10-CM

## 2023-11-08 DIAGNOSIS — M25562 Pain in left knee: Secondary | ICD-10-CM

## 2023-11-08 MED ORDER — METHYLPREDNISOLONE ACETATE 40 MG/ML IJ SUSP
40.0000 mg | INTRAMUSCULAR | Status: AC | PRN
  Administered 2023-11-08: 40 mg via INTRA_ARTICULAR

## 2023-11-08 MED ORDER — LIDOCAINE HCL 1 % IJ SOLN
3.0000 mL | INTRAMUSCULAR | Status: AC | PRN
  Administered 2023-11-08: 3 mL

## 2023-11-08 NOTE — Progress Notes (Signed)
 The patient is an active 68 year old gentleman well-known to us .  He comes in today for follow-up after having a subacromial steroid injection in his right shoulder and at least 1 physical therapy visit that was able to show him a lot of things to do for his shoulder.  He said the shoulder injection was very helpful and he is doing great.  We have replaced his right knee in the past.  He does have significant arthritis in his left knee and we talked about steroid injection at left knee today.  He does not wish to have a steroid injection in his left knee today to help him through the holidays.  He is not diabetic.  Again he said the shoulder is doing great.  He has excellent range of motion of his right shoulder with no significant signs of impingement or pain today.  His left knee does have some slight varus malalignment and pain throughout the arc of motion of the knee especially at the medial compartment and patellofemoral joint.  Per his request we did place a steroid injection in his left knee which he tolerated well.  For now follow-up can be as needed unless things worsen.    Procedure Note  Patient: David Wall             Date of Birth: [birth year]           MRN: [id number]             Visit Date: 11/08/2023  Procedures: Visit Diagnoses:  1. Chronic right shoulder pain   2. Chronic pain of left knee     Large Joint Inj: L knee on 11/08/2023 3:28 PM Indications: diagnostic evaluation and pain Details: 22 G 1.5 in needle, superolateral approach  Arthrogram: No  Medications: 3 mL lidocaine  1 %; 40 mg methylPREDNISolone  acetate 40 MG/ML Outcome: tolerated well, no immediate complications Procedure, treatment alternatives, risks and benefits explained, specific risks discussed. Consent was given by the patient. Immediately prior to procedure a time out was called to verify the correct patient, procedure, equipment, support staff and site/side marked as required. Patient was prepped  and draped in the usual sterile fashion.

## 2023-11-09 ENCOUNTER — Encounter: Admitting: Rehabilitative and Restorative Service Providers"

## 2023-11-15 ENCOUNTER — Ambulatory Visit (INDEPENDENT_AMBULATORY_CARE_PROVIDER_SITE_OTHER): Admitting: Orthopedic Surgery

## 2023-11-15 DIAGNOSIS — M654 Radial styloid tenosynovitis [de Quervain]: Secondary | ICD-10-CM

## 2023-11-15 NOTE — Progress Notes (Unsigned)
 [name] - 68 y.o. male MRN [id number]  Date of birth: May 08, 1955  Office Visit Note: Visit Date: 11/15/2023 PCP: Jason Leita Repine, FNP (Inactive) Referred by: Jason Leita Repine,*  Subjective: No chief complaint on file.  HPI: SHAMUS DESANTIS is a pleasant 68 y.o. male who returns today for follow-up of bilateral de Quervain's tenosynovitis, underwent injections to the bilateral wrist at his previous visit approximately 6 weeks prior.  States that the injections have helped significantly.  Is also been utilizing Comfort Cool braces as needed.  Is very pleased with his progress  He is here today for specific hand surgical evaluation.  Pertinent ROS were reviewed with the patient and found to be negative unless otherwise specified above in HPI.   Assessment & Plan: Visit Diagnoses:  1. De Quervain's disease (radial styloid tenosynovitis)      Plan: I am pleased to see that he has done so well with the bilateral de Quervain's injections.  He does also have underlying thumb CMC arthritis which is being managed conservatively currently.  He can continue with conservative treatment options for the time being given his results, he is welcome return as needed moving forward.  Expressed full understanding    Follow-up: No follow-ups on file.   Meds & Orders: No orders of the defined types were placed in this encounter.   No orders of the defined types were placed in this encounter.    Procedures: No procedures performed      Clinical History: No specialty comments available.  He reports that he has never smoked. He has never been exposed to tobacco smoke. He has never used smokeless tobacco.  Recent Labs    04/09/23 1139  HGBA1C 7.8*    Objective:   Vital Signs: There were no vitals taken for this visit.  Physical Exam  Gen: Well-appearing, in no acute distress; non-toxic CV: Regular Rate. Well-perfused. Warm.  Resp: Breathing unlabored on room air; no  wheezing. Psych: Fluid speech in conversation; appropriate affect; normal thought process  Ortho Exam General: Patient is well appearing and in no distress.    Skin and Muscle: No skin changes are apparent to upper extremities.  Muscle bulk and contour normal, no signs of atrophy.      Range of Motion and Palpation Tests: Mobility is full about the elbows with flexion and extension.  Forearm supination and pronation are 85/85 bilaterally.  Wrist flexion/extension is 75/65 bilaterally.  Digital flexion and extension are full.  Thumb opposition is full to the base of the small fingers bilaterally.     No cords or nodules are palpated.  No triggering is observed.     Minimal tenderness over the bilateral thumb CMC articulation is observed, positive grind for crepitus.  MP hyperextension negative.    Finklestein test negative bilateral   Neurologic, Vascular, Motor: Sensation is intact to light touch in the median/radial/ulnar distributions.  Tinel's testing negative at wrist level. Phalen's negative bilateral, Derkan's compression negative bilateral.  Fingers pink and well perfused.  Capillary refill is brisk.     Imaging: No results found.   Past Medical/Family/Surgical/Social History: Medications & Allergies reviewed per EMR, new medications updated. Patient Active Problem List   Diagnosis Date Noted   Exertional dyspnea 11/17/2022   Pulmonary nodules 08/01/2021   Olecranon bursitis, left elbow 07/30/2021   Cubital tunnel syndrome, left 12/26/2020   Unilateral primary osteoarthritis, left knee 02/28/2019   Osteoarthritis of right knee 06/15/2014   Status post total  right knee replacement 06/15/2014   Sleep disorder 04/25/2014   Inguinal hernia 12/21/2013   Diabetes (HCC) 10/11/2013   Routine general medical examination at a health care facility 10/10/2013   Screening for prostate cancer 10/10/2013   Gout 04/26/2008   RECTAL BLEEDING 04/26/2008   CHEST PAIN UNSPECIFIED  04/25/2007   HYPERCHOLESTEROLEMIA 01/26/2007   UNSPECIFIED CHRONIC ISCHEMIC HEART DISEASE 01/26/2007   CERVICAL RADICULOPATHY, RIGHT 01/26/2007   INSOMNIA 01/26/2007   Past Medical History:  Diagnosis Date   Arthritis    Asthma    hx of in childhood   Bilateral inguinal hernia    Cubital tunnel syndrome on left    Diabetes mellitus without complication (HCC)    no tx   GERD (gastroesophageal reflux disease)    Gout    Gout    H/O urinary frequency    as noted in H&P per Dr Kassie 04/25/2014    Heart disease    Hypercholesterolemia    Insomnia    Olecranon bursitis    Osteoarthritis of both knees    Pulmonary nodules    Radiculopathy    Rectal bleed    Family History  Problem Relation Age of Onset   Healthy Mother    Healthy Father    Colon cancer Neg Hx    Esophageal cancer Neg Hx    Stomach cancer Neg Hx    Rectal cancer Neg Hx    Past Surgical History:  Procedure Laterality Date   COLONOSCOPY     EYE SURGERY     laser surgery bilat    JOINT REPLACEMENT     TOTAL KNEE ARTHROPLASTY Right 06/15/2014   Procedure: RIGHT TOTAL KNEE ARTHROPLASTY;  Surgeon: Lonni CINDERELLA Poli, MD;  Location: [facility];  Service: Orthopedics;  Laterality: Right;   UPPER GI ENDOSCOPY     Social History   Occupational History   Occupation: Cook  Tobacco Use   Smoking status: Never    Passive exposure: Never   Smokeless tobacco: Never  Substance and Sexual Activity   Alcohol use: Yes    Comment: occasional; 1-2 drinks per week per pt   Drug use: No   Sexual activity: Not on file    Chandra Feger Estela) Arlinda, M.D. [facility], Hand Surgery

## 2023-11-16 ENCOUNTER — Encounter: Payer: Self-pay | Admitting: Rehabilitative and Restorative Service Providers"

## 2023-11-16 ENCOUNTER — Ambulatory Visit: Admitting: Rehabilitative and Restorative Service Providers"

## 2023-11-16 DIAGNOSIS — M25511 Pain in right shoulder: Secondary | ICD-10-CM | POA: Diagnosis not present

## 2023-11-16 DIAGNOSIS — M25562 Pain in left knee: Secondary | ICD-10-CM

## 2023-11-16 DIAGNOSIS — M6281 Muscle weakness (generalized): Secondary | ICD-10-CM

## 2023-11-16 DIAGNOSIS — G8929 Other chronic pain: Secondary | ICD-10-CM

## 2023-11-16 DIAGNOSIS — R262 Difficulty in walking, not elsewhere classified: Secondary | ICD-10-CM

## 2023-11-16 NOTE — Therapy (Signed)
 OUTPATIENT PHYSICAL THERAPY TREATMENT   Patient Name: David Wall MRN: [id number] DOB:[birth year], 68 y.o., male Today's Date: 11/16/2023  END OF SESSION:  PT End of Session - 11/16/23 1514     Visit Number 2    Number of Visits 20    Date for Recertification  12/29/23    Authorization Type [employer] UMR 60 VL 10% coinsurance.    PT Start Time 1509    PT Stop Time 1547    PT Time Calculation (min) 38 min    Activity Tolerance Patient tolerated treatment well    Behavior During Therapy WFL for tasks assessed/performed           Past Medical History:  Diagnosis Date   Arthritis    Asthma    hx of in childhood   Bilateral inguinal hernia    Cubital tunnel syndrome on left    Diabetes mellitus without complication (HCC)    no tx   GERD (gastroesophageal reflux disease)    Gout    Gout    H/O urinary frequency    as noted in H&P per Dr Kassie 04/25/2014    Heart disease    Hypercholesterolemia    Insomnia    Olecranon bursitis    Osteoarthritis of both knees    Pulmonary nodules    Radiculopathy    Rectal bleed    Past Surgical History:  Procedure Laterality Date   COLONOSCOPY     EYE SURGERY     laser surgery bilat    JOINT REPLACEMENT     TOTAL KNEE ARTHROPLASTY Right 06/15/2014   Procedure: RIGHT TOTAL KNEE ARTHROPLASTY;  Surgeon: Lonni CINDERELLA Poli, MD;  Location: [facility];  Service: Orthopedics;  Laterality: Right;   UPPER GI ENDOSCOPY     Patient Active Problem List   Diagnosis Date Noted   Exertional dyspnea 11/17/2022   Pulmonary nodules 08/01/2021   Olecranon bursitis, left elbow 07/30/2021   Cubital tunnel syndrome, left 12/26/2020   Unilateral primary osteoarthritis, left knee 02/28/2019   Osteoarthritis of right knee 06/15/2014   Status post total right knee replacement 06/15/2014   Sleep disorder 04/25/2014   Inguinal hernia 12/21/2013   Diabetes (HCC) 10/11/2013   Routine general medical examination at a health care facility 10/10/2013    Screening for prostate cancer 10/10/2013   Gout 04/26/2008   RECTAL BLEEDING 04/26/2008   CHEST PAIN UNSPECIFIED 04/25/2007   HYPERCHOLESTEROLEMIA 01/26/2007   UNSPECIFIED CHRONIC ISCHEMIC HEART DISEASE 01/26/2007   CERVICAL RADICULOPATHY, RIGHT 01/26/2007   INSOMNIA 01/26/2007    PCP: Jason Leita Repine FNP  REFERRING PROVIDER: Poli Lonni CINDERELLA, MD  REFERRING DIAG: [phone] (ICD-10-CM) - Chronic right shoulder pain M25.562,G89.29 (ICD-10-CM) - Chronic pain of left knee M17.12 (ICD-10-CM) - Unilateral primary osteoarthritis, left knee  THERAPY DIAG:  Chronic right shoulder pain  Chronic pain of left knee  Muscle weakness (generalized)  Difficulty in walking, not elsewhere classified  Rationale for Evaluation and Treatment: Rehabilitation  ONSET DATE: About 6 months or so.   SUBJECTIVE:   SUBJECTIVE STATEMENT: Pt indicated collected improved over.  Pt indicated having injection in Lt knee last week or so.  Pt indicated no pain upon arrival.  Reported sleeping better.    Rt hand dominant.   PERTINENT HISTORY: Arthritis, asthma, DM, Hypercholesterolemia, OA both knees.   - History of Rt TKA.  Steroid injection 09/27/2023 Rt shoulder  PAIN:  NPRS scale: no specific pain upon arrival.  At worst for shoulder in last week 3/10, at  worst for knee in last week 2/10 Pain location: Rt shoulder, Lt knee  Pain description: Rt shoulder (sharp at times) Aggravating factors: lying on Rt shoulder, reaching up with Rt shoulder.  Lt knee: stairs, bending, squatting.  Relieving factors: injection   PRECAUTIONS: None  WEIGHT BEARING RESTRICTIONS: No  FALLS:  Has patient fallen in last 6 months? 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  Lifting < 50 lbs.   PLOF: Independent.  Fishing, watch sports, Jeep  PATIENT GOALS: Reduce pain   OBJECTIVE:    PATIENT SURVEYS:  Patient-Specific Activity Scoring Scheme  0 represents "unable to perform." 10 represents "able to perform at prior level. 0 1 2 3 4 5 6 7 8 9  10 (Date and Score)   Activity Eval  10/20/2023  11/16/2023  1. Stairs   5  8  2. Reaching arm up  6  8  3. Bending to lift off floor 5 5  4.     5.    Score 5.667 7 avg    Total score = sum of the activity scores/number of activities Minimum detectable change (90%CI) for average score = 2 points Minimum detectable change (90%CI) for single activity score = 3 points  COGNITION: 10/20/2023 Overall cognitive status: WFL    SENSATION: 10/20/2023 Unremarkable   EDEMA:  10/20/2023 Not observed or noted  MUSCLE LENGTH: 10/20/2023 No specific testing  POSTURE:  10/20/2023 Rounded shoulders  PALPATION: 10/20/2023 Trigger points noted in Rt infraspinatus, possible connection to concordant symptoms.   UPPER EXTREMITY ROM:   ROM Right Eval 10/20/2023 Left Eval 10/20/2023  Shoulder flexion 155 AROM in supine.  Mild end range pain   [facility]  Shoulder extension    Shoulder abduction 155 AROM in supine [facility]  Shoulder adduction    Shoulder extension    Shoulder internal rotation 70 AROM in supine 60 deg abduction Tightness noted   Shoulder external rotation 75 AROM in supine 60 deg abduction    Elbow flexion    Elbow extension    Wrist flexion    Wrist extension    Wrist ulnar deviation    Wrist radial deviation    Wrist pronation    Wrist supination     (Blank rows = not tested)  UPPER EXTREMITY MMT:  MMT Right Eval 10/20/2023 Left Eval 10/20/2023 Right 11/16/2023  Shoulder flexion 3+/5 c pain 5/5 5/5  Shoulder extension     Shoulder abduction 3+/5 c pain 5/5 5/5  Shoulder adduction     Shoulder extension     Shoulder internal rotation 5/5 5/5 5/5  Shoulder external rotation 4/5 4/5 5/5  Middle trapezius     Lower trapezius     Elbow flexion 5/5 5/5   Elbow extension 5/5 5/5   Wrist flexion      Wrist extension     Wrist ulnar deviation     Wrist radial deviation     Wrist pronation     Wrist supination     Grip strength      (Blank rows = not tested)   LOWER EXTREMITY ROM:   ROM Right Eval 10/20/2023 Left Eval 10/20/2023  Hip flexion    Hip extension    Hip abduction    Hip adduction    Hip internal rotation    Hip external rotation    Knee flexion [facility] WFL compared to Rt knee   Knee extension 0  0  Ankle dorsiflexion    Ankle plantarflexion    Ankle inversion    Ankle eversion     (Blank rows = not tested)  LOWER EXTREMITY MMT:  MMT Right Eval 10/20/2023 Left Eval 10/20/2023  Hip flexion 5/5 5/5  Hip extension    Hip abduction    Hip adduction    Hip internal rotation    Hip external rotation    Knee flexion 5/5 5/5  Knee extension 5/5 72, 67.4 lbs 5/5 52, 53 lbs  Ankle dorsiflexion 5/5 5/5  Ankle plantarflexion    Ankle inversion    Ankle eversion     (Blank rows = not tested)  LOWER EXTREMITY SPECIAL TESTS:  10/20/2023 (-) drop arm Rt, (+) painful arc Rt.   FUNCTIONAL TESTS:  10/20/2023 18 inch chair transfer: s UE assist with deviation to Rt leg.  Lt SLS: < 5 seconds Rt SLS: < 5 seconds  GAIT: 10/20/2023 Independent ambulation                                                                                                                                                                        TODAY'S TREATMENT                                                                          DATE: 11/16/2023 Therex: Nustep Lvl 6 UE/LE 10 mins for ROM, endurance.  Seated bilateral shoulder ER c scapular retraction blue band 2 x 10 Supine horizontal abduction blue band 2 x 10 bilaterally  Time spent in review of HEP and updates.  Cues given verbally. Emphasis on control against resistance.   TherActivity Lateral step updown with slow lowering focus 4 inch step 2 x 10 bilateral with hand assist on bar Step up forward 6 inch step x 10 WB  bilaterally  Sit to stand to sit 19 inch table s UE assist with fast up, slow lowering x 20    TODAY'S TREATMENT                                                                          DATE: 10/20/2023 Therex:    HEP instruction/performance c cues for techniques,  handout provided.  Trial set performed of each for comprehension and symptom assessment.  See below for exercise list  PATIENT EDUCATION:  Education details: HEP, POC Person educated: Patient Education method: Explanation, Demonstration, Verbal cues, and Handouts Education comprehension: verbalized understanding, returned demonstration, and verbal cues required  HOME EXERCISE PROGRAM: Access Code: [email or web address] URL: [email or web address] Date: 11/16/2023 Prepared by: Ozell Silvan  Exercises - Sleeper Stretch (Mirrored)  - 2 x daily - 7 x weekly - 1 sets - 5 reps - 30 hold - Standing Shoulder Posterior Capsule Stretch (Mirrored)  - 2 x daily - 7 x weekly - 1 sets - 5 reps - 15-30 hold - Sit to Stand  - 3 x daily - 7 x weekly - 1 sets - 10 reps - Seated Quad Set (Mirrored)  - 2 x daily - 7 x weekly - 1 sets - 10 reps - 5 hold - Seated SLR (Mirrored)  - 1-2 x daily - 7 x weekly - 1-2 sets - 10-15 reps - 2 hold - Standing Bilateral Low Shoulder Row with Anchored Resistance  - 1-2 x daily - 7 x weekly - 2-3 sets - 10-15 reps - Shoulder Extension with Resistance  - 1-2 x daily - 7 x weekly - 1-2 sets - 10-15 reps - Shoulder External Rotation Reactive Isometrics (Mirrored)  - 1 x daily - 7 x weekly - 1 sets - 10 reps - 5-15 hold - Step Up  - 1 x daily - 7 x weekly - 1-2 sets - 10 reps - Lateral Step Down  - 1 x daily - 7 x weekly - 1-2 sets - 10 reps  ASSESSMENT:  CLINICAL IMPRESSION: PSFS showed improvement as well as check of Rt shoulder strength.  Overall making improvements in function and symptom reduction.  Recommend continued skilled PT services to progress towards goals.   OBJECTIVE IMPAIRMENTS: decreased  activity tolerance, decreased balance, decreased coordination, decreased endurance, decreased mobility, difficulty walking, decreased ROM, decreased strength, increased fascial restrictions, impaired perceived functional ability, impaired flexibility, impaired UE functional use, improper body mechanics, postural dysfunction, and pain.   ACTIVITY LIMITATIONS: carrying, lifting, bending, sitting, squatting, sleeping, stairs, transfers, bed mobility, reach over head, and locomotion level  PARTICIPATION LIMITATIONS: interpersonal relationship and community activity  PERSONAL FACTORS: Arthritis, asthma, DM, Hypercholesterolemia, OA both knees, multiple treatment areas  are also affecting patient's functional outcome.   REHAB POTENTIAL: Good  CLINICAL DECISION MAKING: Stable/uncomplicated  EVALUATION COMPLEXITY: Low   GOALS: Goals reviewed with patient? Yes  SHORT TERM GOALS: (target date for Short term goals are 3 weeks 11/10/2023)   1.  Patient will demonstrate independent use of home exercise program to maintain progress from in clinic treatments.  Goal status: Met   LONG TERM GOALS: (target dates for all long term goals are 10 weeks  12/29/2023 )   1. Patient will demonstrate/report pain at worst less than or equal to 2/10 to facilitate minimal limitation in daily activity secondary to pain symptoms.  Goal status: on going 11/16/2023   2. Patient will demonstrate independent use of home exercise program to facilitate ability to maintain/progress functional gains from skilled physical therapy services.  Goal status: on going 11/16/2023   3. Patient will demonstrate Patient specific functional scale avg > or = 8/10 to indicate reduced disability due to condition.   Goal status: on going 11/16/2023   4.  Patient will demonstrate Lt LE MMT 5/5, knee extension dynamometer Lt knee extension within 10 % of Rt throughout to  faciltiate usual transfers, stairs, squatting at PLOF for daily  life.   Goal status: on going 11/16/2023   5.  Patient will demonstrate Rt shoulder MMT 5/5 throughout to facilitate usual reaching, lifting, carrying at PLOF in daily life.  Goal status: Met 11/16/2023   6.  Patient will demonstrate Rt AROM WFL s symptoms to facilitate usual daily activity and work at [employer].  Goal status: on going 11/16/2023      PLAN:  PT FREQUENCY: 1-2x/week  PT DURATION: 10 weeks  PLANNED INTERVENTIONS: Can include 02853- PT Re-evaluation, 97110-Therapeutic exercises, 97530- Therapeutic activity, 97112- Neuromuscular re-education, 97535- Self Care, 97140- Manual therapy, [phone]- Gait training, [phone]- Orthotic Fit/training, [phone]- Canalith repositioning, [id number]- Aquatic Therapy, [phone]- Electrical stimulation (unattended), [id number] Physical performance testing, 97016- Vasopneumatic device, [id number]- Ultrasound, [id number]- Traction (mechanical), [id number]- Ionotophoresis 4mg /ml Dexamethasone,  20560 - Needle insertion w/o injection 1 or 2 muscles, 20561 - Needle insertion w/o injection 3 or more muscles.   Patient/Family education, Balance training, Stair training, Taping, Dry Needling, Joint mobilization, Joint manipulation, Spinal manipulation, Spinal mobilization, Scar mobilization, Vestibular training, Visual/preceptual remediation/compensation, DME instructions, Cryotherapy, and Moist heat.  All performed as medically necessary.  All included unless contraindicated  PLAN FOR NEXT SESSION: Review HEP knowledge/results.  Machine leg press, knee extension strengthening.    Ozell Silvan, PT, DPT, OCS, ATC 11/16/23  3:47 PM

## 2023-11-22 ENCOUNTER — Encounter: Payer: Self-pay | Admitting: Radiology

## 2023-11-23 ENCOUNTER — Encounter: Admitting: Rehabilitative and Restorative Service Providers"

## 2023-11-23 ENCOUNTER — Telehealth: Payer: Self-pay | Admitting: Rehabilitative and Restorative Service Providers"

## 2023-11-23 NOTE — Telephone Encounter (Signed)
 Pt indicated he replied no to reminder for today's appointment.  Reminded him of next week's appointment time.  Ozell Silvan, PT, DPT, OCS, ATC 11/23/23  3:35 PM

## 2023-11-23 NOTE — Therapy (Incomplete)
 OUTPATIENT PHYSICAL THERAPY TREATMENT   Patient Name: David Wall MRN: [id number] DOB:[birth year], 68 y.o., male Today's Date: 11/23/2023  END OF SESSION:     Past Medical History:  Diagnosis Date   Arthritis    Asthma    hx of in childhood   Bilateral inguinal hernia    Cubital tunnel syndrome on left    Diabetes mellitus without complication (HCC)    no tx   GERD (gastroesophageal reflux disease)    Gout    Gout    H/O urinary frequency    as noted in H&P per Dr Kassie 04/25/2014    Heart disease    Hypercholesterolemia    Insomnia    Olecranon bursitis    Osteoarthritis of both knees    Pulmonary nodules    Radiculopathy    Rectal bleed    Past Surgical History:  Procedure Laterality Date   COLONOSCOPY     EYE SURGERY     laser surgery bilat    JOINT REPLACEMENT     TOTAL KNEE ARTHROPLASTY Right 06/15/2014   Procedure: RIGHT TOTAL KNEE ARTHROPLASTY;  Surgeon: Lonni CINDERELLA Poli, MD;  Location: [facility];  Service: Orthopedics;  Laterality: Right;   UPPER GI ENDOSCOPY     Patient Active Problem List   Diagnosis Date Noted   Exertional dyspnea 11/17/2022   Pulmonary nodules 08/01/2021   Olecranon bursitis, left elbow 07/30/2021   Cubital tunnel syndrome, left 12/26/2020   Unilateral primary osteoarthritis, left knee 02/28/2019   Osteoarthritis of right knee 06/15/2014   Status post total right knee replacement 06/15/2014   Sleep disorder 04/25/2014   Inguinal hernia 12/21/2013   Diabetes (HCC) 10/11/2013   Routine general medical examination at a health care facility 10/10/2013   Screening for prostate cancer 10/10/2013   Gout 04/26/2008   RECTAL BLEEDING 04/26/2008   CHEST PAIN UNSPECIFIED 04/25/2007   HYPERCHOLESTEROLEMIA 01/26/2007   UNSPECIFIED CHRONIC ISCHEMIC HEART DISEASE 01/26/2007   CERVICAL RADICULOPATHY, RIGHT 01/26/2007   INSOMNIA 01/26/2007    PCP: Jason Leita Repine FNP  REFERRING PROVIDER: Poli Lonni CINDERELLA,  MD  REFERRING DIAG: [phone] (ICD-10-CM) - Chronic right shoulder pain M25.562,G89.29 (ICD-10-CM) - Chronic pain of left knee M17.12 (ICD-10-CM) - Unilateral primary osteoarthritis, left knee  THERAPY DIAG:  No diagnosis found.  Rationale for Evaluation and Treatment: Rehabilitation  ONSET DATE: About 6 months or so.   SUBJECTIVE:   SUBJECTIVE STATEMENT: Pt indicated collected improved over.  Pt indicated having injection in Lt knee last week or so.  Pt indicated no pain upon arrival.  Reported sleeping better.    Rt hand dominant.   PERTINENT HISTORY: Arthritis, asthma, DM, Hypercholesterolemia, OA both knees.   - History of Rt TKA.  Steroid injection 09/27/2023 Rt shoulder  PAIN:  NPRS scale: no specific pain upon arrival.  At worst for shoulder in last week 3/10, at worst for knee in last week 2/10 Pain location: Rt shoulder, Lt knee  Pain description: Rt shoulder (sharp at times) Aggravating factors: lying on Rt shoulder, reaching up with Rt shoulder.  Lt knee: stairs, bending, squatting.  Relieving factors: injection   PRECAUTIONS: None  WEIGHT BEARING RESTRICTIONS: No  FALLS:  Has patient fallen in last 6 months? 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  Lifting < 50 lbs.   PLOF: Independent.  Fishing, watch sports, Jeep  PATIENT GOALS: Reduce pain   OBJECTIVE:  PATIENT SURVEYS:  Patient-Specific Activity Scoring Scheme  0 represents "unable to perform." 10 represents "able to perform at prior level. 0 1 2 3 4 5 6 7 8 9  10 (Date and Score)   Activity Eval  10/20/2023  11/16/2023  1. Stairs   5  8  2. Reaching arm up  6  8  3. Bending to lift off floor 5 5  4.     5.    Score 5.667 7 avg    Total score = sum of the activity scores/number of activities Minimum detectable change (90%CI) for average score = 2 points Minimum  detectable change (90%CI) for single activity score = 3 points  COGNITION: 10/20/2023 Overall cognitive status: WFL    SENSATION: 10/20/2023 Unremarkable   EDEMA:  10/20/2023 Not observed or noted  MUSCLE LENGTH: 10/20/2023 No specific testing  POSTURE:  10/20/2023 Rounded shoulders  PALPATION: 10/20/2023 Trigger points noted in Rt infraspinatus, possible connection to concordant symptoms.   UPPER EXTREMITY ROM:   ROM Right Eval 10/20/2023 Left Eval 10/20/2023  Shoulder flexion 155 AROM in supine.  Mild end range pain   [facility]  Shoulder extension    Shoulder abduction 155 AROM in supine [facility]  Shoulder adduction    Shoulder extension    Shoulder internal rotation 70 AROM in supine 60 deg abduction Tightness noted   Shoulder external rotation 75 AROM in supine 60 deg abduction    Elbow flexion    Elbow extension    Wrist flexion    Wrist extension    Wrist ulnar deviation    Wrist radial deviation    Wrist pronation    Wrist supination     (Blank rows = not tested)  UPPER EXTREMITY MMT:  MMT Right Eval 10/20/2023 Left Eval 10/20/2023 Right 11/16/2023  Shoulder flexion 3+/5 c pain 5/5 5/5  Shoulder extension     Shoulder abduction 3+/5 c pain 5/5 5/5  Shoulder adduction     Shoulder extension     Shoulder internal rotation 5/5 5/5 5/5  Shoulder external rotation 4/5 4/5 5/5  Middle trapezius     Lower trapezius     Elbow flexion 5/5 5/5   Elbow extension 5/5 5/5   Wrist flexion     Wrist extension     Wrist ulnar deviation     Wrist radial deviation     Wrist pronation     Wrist supination     Grip strength      (Blank rows = not tested)   LOWER EXTREMITY ROM:   ROM Right Eval 10/20/2023 Left Eval 10/20/2023  Hip flexion    Hip extension    Hip abduction    Hip adduction    Hip internal rotation    Hip external rotation    Knee flexion [facility] WFL compared to Rt knee   Knee extension 0 0  Ankle dorsiflexion    Ankle plantarflexion    Ankle  inversion    Ankle eversion     (Blank rows = not tested)  LOWER EXTREMITY MMT:  MMT Right Eval 10/20/2023 Left Eval 10/20/2023  Hip flexion 5/5 5/5  Hip extension    Hip abduction    Hip adduction    Hip internal rotation    Hip external rotation    Knee flexion 5/5 5/5  Knee extension 5/5 72, 67.4 lbs 5/5 52, 53 lbs  Ankle dorsiflexion 5/5 5/5  Ankle plantarflexion    Ankle inversion    Ankle eversion     (  Blank rows = not tested)  LOWER EXTREMITY SPECIAL TESTS:  10/20/2023 (-) drop arm Rt, (+) painful arc Rt.   FUNCTIONAL TESTS:  10/20/2023 18 inch chair transfer: s UE assist with deviation to Rt leg.  Lt SLS: < 5 seconds Rt SLS: < 5 seconds  GAIT: 10/20/2023 Independent ambulation                                                                                                                                                                        TODAY'S TREATMENT                                                                          DATE: 11/23/2023 Therex:      TODAY'S TREATMENT                                                                          DATE: 11/16/2023 Therex: Nustep Lvl 6 UE/LE 10 mins for ROM, endurance.  Seated bilateral shoulder ER c scapular retraction blue band 2 x 10 Supine horizontal abduction blue band 2 x 10 bilaterally  Time spent in review of HEP and updates.  Cues given verbally. Emphasis on control against resistance.   TherActivity Lateral step updown with slow lowering focus 4 inch step 2 x 10 bilateral with hand assist on bar Step up forward 6 inch step x 10 WB bilaterally  Sit to stand to sit 19 inch table s UE assist with fast up, slow lowering x 20    TODAY'S TREATMENT                                                                          DATE: 10/20/2023 Therex:    HEP instruction/performance c cues for techniques, handout provided.  Trial set performed of each for comprehension and symptom assessment.  See below  for exercise list  PATIENT EDUCATION:  Education details: HEP, POC  Person educated: Patient Education method: Explanation, Demonstration, Verbal cues, and Handouts Education comprehension: verbalized understanding, returned demonstration, and verbal cues required  HOME EXERCISE PROGRAM: Access Code: [email or web address] URL: [email or web address] Date: 11/16/2023 Prepared by: Ozell Silvan  Exercises - Sleeper Stretch (Mirrored)  - 2 x daily - 7 x weekly - 1 sets - 5 reps - 30 hold - Standing Shoulder Posterior Capsule Stretch (Mirrored)  - 2 x daily - 7 x weekly - 1 sets - 5 reps - 15-30 hold - Sit to Stand  - 3 x daily - 7 x weekly - 1 sets - 10 reps - Seated Quad Set (Mirrored)  - 2 x daily - 7 x weekly - 1 sets - 10 reps - 5 hold - Seated SLR (Mirrored)  - 1-2 x daily - 7 x weekly - 1-2 sets - 10-15 reps - 2 hold - Standing Bilateral Low Shoulder Row with Anchored Resistance  - 1-2 x daily - 7 x weekly - 2-3 sets - 10-15 reps - Shoulder Extension with Resistance  - 1-2 x daily - 7 x weekly - 1-2 sets - 10-15 reps - Shoulder External Rotation Reactive Isometrics (Mirrored)  - 1 x daily - 7 x weekly - 1 sets - 10 reps - 5-15 hold - Step Up  - 1 x daily - 7 x weekly - 1-2 sets - 10 reps - Lateral Step Down  - 1 x daily - 7 x weekly - 1-2 sets - 10 reps  ASSESSMENT:  CLINICAL IMPRESSION: PSFS showed improvement as well as check of Rt shoulder strength.  Overall making improvements in function and symptom reduction.  Recommend continued skilled PT services to progress towards goals.   OBJECTIVE IMPAIRMENTS: decreased activity tolerance, decreased balance, decreased coordination, decreased endurance, decreased mobility, difficulty walking, decreased ROM, decreased strength, increased fascial restrictions, impaired perceived functional ability, impaired flexibility, impaired UE functional use, improper body mechanics, postural dysfunction, and pain.   ACTIVITY LIMITATIONS: carrying,  lifting, bending, sitting, squatting, sleeping, stairs, transfers, bed mobility, reach over head, and locomotion level  PARTICIPATION LIMITATIONS: interpersonal relationship and community activity  PERSONAL FACTORS: Arthritis, asthma, DM, Hypercholesterolemia, OA both knees, multiple treatment areas  are also affecting patient's functional outcome.   REHAB POTENTIAL: Good  CLINICAL DECISION MAKING: Stable/uncomplicated  EVALUATION COMPLEXITY: Low   GOALS: Goals reviewed with patient? Yes  SHORT TERM GOALS: (target date for Short term goals are 3 weeks 11/10/2023)   1.  Patient will demonstrate independent use of home exercise program to maintain progress from in clinic treatments.  Goal status: Met   LONG TERM GOALS: (target dates for all long term goals are 10 weeks  12/29/2023 )   1. Patient will demonstrate/report pain at worst less than or equal to 2/10 to facilitate minimal limitation in daily activity secondary to pain symptoms.  Goal status: on going 11/16/2023   2. Patient will demonstrate independent use of home exercise program to facilitate ability to maintain/progress functional gains from skilled physical therapy services.  Goal status: on going 11/16/2023   3. Patient will demonstrate Patient specific functional scale avg > or = 8/10 to indicate reduced disability due to condition.   Goal status: on going 11/16/2023   4.  Patient will demonstrate Lt LE MMT 5/5, knee extension dynamometer Lt knee extension within 10 % of Rt throughout to faciltiate usual transfers, stairs, squatting at PLOF for daily life.   Goal status: on going 11/16/2023   5.  Patient will demonstrate Rt shoulder MMT  5/5 throughout to facilitate usual reaching, lifting, carrying at [facility] in daily life.  Goal status: Met 11/16/2023   6.  Patient will demonstrate Rt AROM WFL s symptoms to facilitate usual daily activity and work at [employer].  Goal status: on going 11/16/2023      PLAN:  PT  FREQUENCY: 1-2x/week  PT DURATION: 10 weeks  PLANNED INTERVENTIONS: Can include 02853- PT Re-evaluation, 97110-Therapeutic exercises, 97530- Therapeutic activity, 97112- Neuromuscular re-education, 97535- Self Care, 97140- Manual therapy, [phone]- Gait training, [phone]- Orthotic Fit/training, [phone]- Canalith repositioning, [id number]- Aquatic Therapy, [phone]- Electrical stimulation (unattended), [id number] Physical performance testing, 97016- Vasopneumatic device, [id number]- Ultrasound, [id number]- Traction (mechanical), [id number]- Ionotophoresis 4mg /ml Dexamethasone,  20560 - Needle insertion w/o injection 1 or 2 muscles, 20561 - Needle insertion w/o injection 3 or more muscles.   Patient/Family education, Balance training, Stair training, Taping, Dry Needling, Joint mobilization, Joint manipulation, Spinal manipulation, Spinal mobilization, Scar mobilization, Vestibular training, Visual/preceptual remediation/compensation, DME instructions, Cryotherapy, and Moist heat.  All performed as medically necessary.  All included unless contraindicated  PLAN FOR NEXT SESSION: Review HEP knowledge/results.  Machine leg press, knee extension strengthening.    Ozell Silvan, PT, DPT, OCS, ATC 11/23/23  8:28 AM

## 2023-11-30 ENCOUNTER — Encounter: Payer: Self-pay | Admitting: Rehabilitative and Restorative Service Providers"

## 2023-11-30 ENCOUNTER — Ambulatory Visit: Admitting: Rehabilitative and Restorative Service Providers"

## 2023-11-30 DIAGNOSIS — M25562 Pain in left knee: Secondary | ICD-10-CM | POA: Diagnosis not present

## 2023-11-30 DIAGNOSIS — M6281 Muscle weakness (generalized): Secondary | ICD-10-CM

## 2023-11-30 DIAGNOSIS — G8929 Other chronic pain: Secondary | ICD-10-CM

## 2023-11-30 DIAGNOSIS — M25511 Pain in right shoulder: Secondary | ICD-10-CM

## 2023-11-30 DIAGNOSIS — R262 Difficulty in walking, not elsewhere classified: Secondary | ICD-10-CM

## 2023-11-30 NOTE — Therapy (Signed)
 OUTPATIENT PHYSICAL THERAPY TREATMENT / DISCHARGE   Patient Name: David Wall MRN: [id number] DOB:09/28/1955, 68 y.o., male Today's Date: 11/30/2023  PHYSICAL THERAPY DISCHARGE SUMMARY  Visits from Start of Care: 3  Current functional level related to goals / functional outcomes: See note   Remaining deficits: See note   Education / Equipment: HEP  Patient goals were met. Patient is being discharged due to being pleased with the current functional level.   END OF SESSION:  PT End of Session - 11/30/23 1539     Visit Number 3    Number of Visits 20    Date for Recertification  12/29/23    Authorization Type [employer] UMR 60 VL 10% coinsurance.    PT Start Time 1519    PT Stop Time 1557    PT Time Calculation (min) 38 min    Activity Tolerance Patient tolerated treatment well    Behavior During Therapy WFL for tasks assessed/performed            Past Medical History:  Diagnosis Date   Arthritis    Asthma    hx of in childhood   Bilateral inguinal hernia    Cubital tunnel syndrome on left    Diabetes mellitus without complication (HCC)    no tx   GERD (gastroesophageal reflux disease)    Gout    Gout    H/O urinary frequency    as noted in H&P per Dr Kassie 04/25/2014    Heart disease    Hypercholesterolemia    Insomnia    Olecranon bursitis    Osteoarthritis of both knees    Pulmonary nodules    Radiculopathy    Rectal bleed    Past Surgical History:  Procedure Laterality Date   COLONOSCOPY     EYE SURGERY     laser surgery bilat    JOINT REPLACEMENT     TOTAL KNEE ARTHROPLASTY Right 06/15/2014   Procedure: RIGHT TOTAL KNEE ARTHROPLASTY;  Surgeon: Lonni CINDERELLA Poli, MD;  Location: [facility];  Service: Orthopedics;  Laterality: Right;   UPPER GI ENDOSCOPY     Patient Active Problem List   Diagnosis Date Noted   Exertional dyspnea 11/17/2022   Pulmonary nodules 08/01/2021   Olecranon bursitis, left elbow 07/30/2021   Cubital tunnel syndrome,  left 12/26/2020   Unilateral primary osteoarthritis, left knee 02/28/2019   Osteoarthritis of right knee 06/15/2014   Status post total right knee replacement 06/15/2014   Sleep disorder 04/25/2014   Inguinal hernia 12/21/2013   Diabetes (HCC) 10/11/2013   Routine general medical examination at a health care facility 10/10/2013   Screening for prostate cancer 10/10/2013   Gout 04/26/2008   RECTAL BLEEDING 04/26/2008   CHEST PAIN UNSPECIFIED 04/25/2007   HYPERCHOLESTEROLEMIA 01/26/2007   UNSPECIFIED CHRONIC ISCHEMIC HEART DISEASE 01/26/2007   CERVICAL RADICULOPATHY, RIGHT 01/26/2007   INSOMNIA 01/26/2007    PCP: Jason Leita Repine FNP  REFERRING PROVIDER: Poli Lonni CINDERELLA, MD  REFERRING DIAG: [phone] (ICD-10-CM) - Chronic right shoulder pain M25.562,G89.29 (ICD-10-CM) - Chronic pain of left knee M17.12 (ICD-10-CM) - Unilateral primary osteoarthritis, left knee  THERAPY DIAG:  Chronic right shoulder pain  Chronic pain of left knee  Muscle weakness (generalized)  Difficulty in walking, not elsewhere classified  Rationale for Evaluation and Treatment: Rehabilitation  ONSET DATE: About 6 months or so.   SUBJECTIVE:   SUBJECTIVE STATEMENT: Pt indicated no having pain complaints. Pt had questions about what type of things to continue to improve strength.  Rt hand dominant.   PERTINENT HISTORY: Arthritis, asthma, DM, Hypercholesterolemia, OA both knees.   - History of Rt TKA.  Steroid injection 09/27/2023 Rt shoulder  PAIN:  NPRS scale: no specific pain upon arrival.  At worst for shoulder in last week 3/10, at worst for knee in last week 2/10 Pain location: Rt shoulder, Lt knee  Pain description: Rt shoulder (sharp at times) Aggravating factors: lying on Rt shoulder, reaching up with Rt shoulder.  Lt knee: stairs, bending, squatting.  Relieving factors: injection   PRECAUTIONS: None  WEIGHT BEARING RESTRICTIONS: No  FALLS:  Has patient fallen in  last 6 months? 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  Lifting < 50 lbs.   PLOF: Independent.  Fishing, watch sports, Jeep  PATIENT GOALS: Reduce pain   OBJECTIVE:   PATIENT SURVEYS:  Patient-Specific Activity Scoring Scheme  0 represents "unable to perform." 10 represents "able to perform at prior level. 0 1 2 3 4 5 6 7 8 9  10 (Date and Score)   Activity Eval  10/20/2023  11/16/2023 11/30/2023  1. Stairs   5  8 9   2. Reaching arm up  6  8 9   3. Bending to lift off floor 5 5 8   4.      5.     Score 5.667 7 avg  8.667   Total score = sum of the activity scores/number of activities Minimum detectable change (90%CI) for average score = 2 points Minimum detectable change (90%CI) for single activity score = 3 points  COGNITION: 10/20/2023 Overall cognitive status: WFL    SENSATION: 10/20/2023 Unremarkable   EDEMA:  10/20/2023 Not observed or noted  MUSCLE LENGTH: 10/20/2023 No specific testing  POSTURE:  10/20/2023 Rounded shoulders  PALPATION: 10/20/2023 Trigger points noted in Rt infraspinatus, possible connection to concordant symptoms.   UPPER EXTREMITY ROM:   ROM Right Eval 10/20/2023 Left Eval 10/20/2023 Right 11/30/2023  WFL s limitations.   Shoulder flexion 155 AROM in supine.  Mild end range pain   [facility]   Shoulder extension     Shoulder abduction 155 AROM in supine [facility]   Shoulder adduction     Shoulder extension     Shoulder internal rotation 70 AROM in supine 60 deg abduction Tightness noted    Shoulder external rotation 75 AROM in supine 60 deg abduction     Elbow flexion     Elbow extension     Wrist flexion     Wrist extension     Wrist ulnar deviation     Wrist radial deviation     Wrist pronation     Wrist supination      (Blank rows = not tested)  UPPER EXTREMITY MMT:  MMT Right Eval 10/20/2023  Left Eval 10/20/2023 Right 11/16/2023  Shoulder flexion 3+/5 c pain 5/5 5/5  Shoulder extension     Shoulder abduction 3+/5 c pain 5/5 5/5  Shoulder adduction     Shoulder extension     Shoulder internal rotation 5/5 5/5 5/5  Shoulder external rotation 4/5 4/5 5/5  Middle trapezius     Lower trapezius     Elbow flexion 5/5 5/5   Elbow extension 5/5 5/5   Wrist flexion     Wrist extension     Wrist ulnar deviation     Wrist radial deviation     Wrist pronation     Wrist supination  Grip strength      (Blank rows = not tested)   LOWER EXTREMITY ROM:   ROM Right Eval 10/20/2023 Left Eval 10/20/2023  Hip flexion    Hip extension    Hip abduction    Hip adduction    Hip internal rotation    Hip external rotation    Knee flexion [facility] WFL compared to Rt knee   Knee extension 0 0  Ankle dorsiflexion    Ankle plantarflexion    Ankle inversion    Ankle eversion     (Blank rows = not tested)  LOWER EXTREMITY MMT:  MMT Right Eval 10/20/2023 Left Eval 10/20/2023 Right 11/30/2023 Left 11/30/2023  Hip flexion 5/5 5/5    Hip extension      Hip abduction      Hip adduction      Hip internal rotation      Hip external rotation      Knee flexion 5/5 5/5    Knee extension 5/5 72, 67.4 lbs 5/5 52, 53 lbs 5/5 77, 75 lbs 5/5 87, 85 lbs  Ankle dorsiflexion 5/5 5/5    Ankle plantarflexion      Ankle inversion      Ankle eversion       (Blank rows = not tested)  LOWER EXTREMITY SPECIAL TESTS:  10/20/2023 (-) drop arm Rt, (+) painful arc Rt.   FUNCTIONAL TESTS:  10/20/2023 18 inch chair transfer: s UE assist with deviation to Rt leg.  Lt SLS: < 5 seconds Rt SLS: < 5 seconds  GAIT: 10/20/2023 Independent ambulation                                                                                                                                                                        TODAY'S TREATMENT                                                                           DATE: 11/30/2023 Therex: Nustep Lvl 6 UE/LE 10 mins for ROM, endurance. Knee extension Double leg up, single leg lowering slowly 15 lbs 2 x 10 bilaterally (cues for gym use) Leg press double leg 100 lbs quick back, slow lowering 2 x15, single leg 2 x 15   Verbal review of HEP for HEP prep. Time spent in recommendations of frequency, etc.     TODAY'S TREATMENT  DATE: 11/16/2023 Therex: Nustep Lvl 6 UE/LE 10 mins for ROM, endurance.  Seated bilateral shoulder ER c scapular retraction blue band 2 x 10 Supine horizontal abduction blue band 2 x 10 bilaterally  Time spent in review of HEP and updates.  Cues given verbally. Emphasis on control against resistance.   TherActivity Lateral step updown with slow lowering focus 4 inch step 2 x 10 bilateral with hand assist on bar Step up forward 6 inch step x 10 WB bilaterally  Sit to stand to sit 19 inch table s UE assist with fast up, slow lowering x 20    TODAY'S TREATMENT                                                                          DATE: 10/20/2023 Therex:    HEP instruction/performance c cues for techniques, handout provided.  Trial set performed of each for comprehension and symptom assessment.  See below for exercise list  PATIENT EDUCATION:  Education details: HEP, POC Person educated: Patient Education method: Explanation, Demonstration, Verbal cues, and Handouts Education comprehension: verbalized understanding, returned demonstration, and verbal cues required  HOME EXERCISE PROGRAM: Access Code: [email or web address] URL: [email or web address] Date: 11/16/2023 Prepared by: Ozell Silvan  Exercises - Sleeper Stretch (Mirrored)  - 2 x daily - 7 x weekly - 1 sets - 5 reps - 30 hold - Standing Shoulder Posterior Capsule Stretch (Mirrored)  - 2 x daily - 7 x weekly - 1 sets - 5 reps - 15-30 hold - Sit to Stand  - 3 x daily - 7 x weekly - 1  sets - 10 reps - Seated Quad Set (Mirrored)  - 2 x daily - 7 x weekly - 1 sets - 10 reps - 5 hold - Seated SLR (Mirrored)  - 1-2 x daily - 7 x weekly - 1-2 sets - 10-15 reps - 2 hold - Standing Bilateral Low Shoulder Row with Anchored Resistance  - 1-2 x daily - 7 x weekly - 2-3 sets - 10-15 reps - Shoulder Extension with Resistance  - 1-2 x daily - 7 x weekly - 1-2 sets - 10-15 reps - Shoulder External Rotation Reactive Isometrics (Mirrored)  - 1 x daily - 7 x weekly - 1 sets - 10 reps - 5-15 hold - Step Up  - 1 x daily - 7 x weekly - 1-2 sets - 10 reps - Lateral Step Down  - 1 x daily - 7 x weekly - 1-2 sets - 10 reps  ASSESSMENT:  CLINICAL IMPRESSION: Strength testing improved as noted.  Pt at this time presented with reaching LTGs and recommend for HEP transitioning and discharge.  Pt in agreement with plan.   OBJECTIVE IMPAIRMENTS: decreased activity tolerance, decreased balance, decreased coordination, decreased endurance, decreased mobility, difficulty walking, decreased ROM, decreased strength, increased fascial restrictions, impaired perceived functional ability, impaired flexibility, impaired UE functional use, improper body mechanics, postural dysfunction, and pain.   ACTIVITY LIMITATIONS: carrying, lifting, bending, sitting, squatting, sleeping, stairs, transfers, bed mobility, reach over head, and locomotion level  PARTICIPATION LIMITATIONS: interpersonal relationship and community activity  PERSONAL FACTORS: Arthritis, asthma, DM, Hypercholesterolemia, OA both knees, multiple treatment areas  are also affecting patient's functional outcome.   REHAB  POTENTIAL: Good  CLINICAL DECISION MAKING: Stable/uncomplicated  EVALUATION COMPLEXITY: Low   GOALS: Goals reviewed with patient? Yes  SHORT TERM GOALS: (target date for Short term goals are 3 weeks 11/10/2023)   1.  Patient will demonstrate independent use of home exercise program to maintain progress from in clinic  treatments.  Goal status: Met by 11/10/2023  LONG TERM GOALS: (target dates for all long term goals are 10 weeks  12/29/2023 )   1. Patient will demonstrate/report pain at worst less than or equal to 2/10 to facilitate minimal limitation in daily activity secondary to pain symptoms.  Goal status: Met 11/30/2023   2. Patient will demonstrate independent use of home exercise program to facilitate ability to maintain/progress functional gains from skilled physical therapy services.  Goal status:Met 11/30/2023   3. Patient will demonstrate Patient specific functional scale avg > or = 8/10 to indicate reduced disability due to condition.   Goal status: Met 11/30/2023   4.  Patient will demonstrate Lt LE MMT 5/5, knee extension dynamometer Lt knee extension within 10 % of Rt throughout to faciltiate usual transfers, stairs, squatting at PLOF for daily life.   Goal status: Met 11/30/2023   5.  Patient will demonstrate Rt shoulder MMT 5/5 throughout to facilitate usual reaching, lifting, carrying at PLOF in daily life.  Goal status: Met 11/16/2023   6.  Patient will demonstrate Rt AROM WFL s symptoms to facilitate usual daily activity and work at [employer].  Goal status: Met 11/30/2023      PLAN:  PT FREQUENCY: 1-2x/week  PT DURATION: 10 weeks  PLANNED INTERVENTIONS: Can include 02853- PT Re-evaluation, 97110-Therapeutic exercises, 97530- Therapeutic activity, 97112- Neuromuscular re-education, 97535- Self Care, 97140- Manual therapy, [phone]- Gait training, [phone]- Orthotic Fit/training, [phone]- Canalith repositioning, [id number]- Aquatic Therapy, [phone]- Electrical stimulation (unattended), [id number] Physical performance testing, 97016- Vasopneumatic device, [id number]- Ultrasound, [id number]- Traction (mechanical), [id number]- Ionotophoresis 4mg /ml Dexamethasone,  20560 - Needle insertion w/o injection 1 or 2 muscles, 20561 - Needle insertion w/o injection 3 or more muscles.   Patient/Family education, Balance  training, Stair training, Taping, Dry Needling, Joint mobilization, Joint manipulation, Spinal manipulation, Spinal mobilization, Scar mobilization, Vestibular training, Visual/preceptual remediation/compensation, DME instructions, Cryotherapy, and Moist heat.  All performed as medically necessary.  All included unless contraindicated  PLAN FOR NEXT SESSION: Discharge to HEP.    Ozell Silvan, PT, DPT, OCS, ATC 11/30/23  3:55 PM

## 2023-12-01 ENCOUNTER — Ambulatory Visit: Payer: Self-pay | Admitting: Gastroenterology

## 2024-02-07 ENCOUNTER — Inpatient Hospital Stay: Admission: RE | Admit: 2024-02-07 | Discharge: 2024-02-07

## 2024-02-07 ENCOUNTER — Other Ambulatory Visit: Payer: Self-pay

## 2024-02-07 VITALS — BP 133/79 | HR 71 | Temp 98.3°F | Resp 18

## 2024-02-07 DIAGNOSIS — M7031 Other bursitis of elbow, right elbow: Secondary | ICD-10-CM | POA: Diagnosis not present

## 2024-02-07 NOTE — Discharge Instructions (Addendum)
 You may use cold compresses to affected area, you may also use Tylenol  or ibuprofen  for symptoms. I do not feel comfortable prescribing NSAIDS due to your aspirin allergy but if you have take OTC ibuprofen  in the past with no issue you are welcome to take that for pain as well. If your symptoms do not improve please keep appt with orthopedist.

## 2024-02-07 NOTE — ED Triage Notes (Signed)
 Having problems out of my elbow with pain. Has a fever in my elbow. When I move it it's in a lot more pain. Been going on about three days now. But it feels like it has a fever in it. - Entered by patient  Pt reports: 3 days of pain in right arm/elbow. History of gout but has never had it in his arm. Arm feels hot to touch and swollen. Pain worse with movement and at night. He has taken tylenol  with minimal relief. Denies known injury.

## 2024-02-07 NOTE — ED Provider Notes (Signed)
 " [facility]    CSN: [id number] Arrival date & time: 02/07/24  0909      History   Chief Complaint Chief Complaint  Patient presents with   Arm Pain    HPI David Wall is a 69 y.o. male.   Pt presents today due to inflammation and pain of right elbow for the past 3 days. Pt reports a hx of gout but denies ever having gout in his elbow. Pt does admit to leaning on his right elbow quite a bit. Pt has been using Tylenol  for pain with no significant relief. Pt maintains full ROM of right elbow. Pt has an appt with ortho next week.   The history is provided by the patient.  Arm Pain    Past Medical History:  Diagnosis Date   Arthritis    Asthma    hx of in childhood   Bilateral inguinal hernia    Cubital tunnel syndrome on left    Diabetes mellitus without complication (HCC)    no tx   GERD (gastroesophageal reflux disease)    Gout    Gout    H/O urinary frequency    as noted in H&P per Dr Kassie 04/25/2014    Heart disease    Hypercholesterolemia    Insomnia    Olecranon bursitis    Osteoarthritis of both knees    Pulmonary nodules    Radiculopathy    Rectal bleed     Patient Active Problem List   Diagnosis Date Noted   Exertional dyspnea 11/17/2022   Pulmonary nodules 08/01/2021   Olecranon bursitis, left elbow 07/30/2021   Cubital tunnel syndrome, left 12/26/2020   Unilateral primary osteoarthritis, left knee 02/28/2019   Osteoarthritis of right knee 06/15/2014   Status post total right knee replacement 06/15/2014   Sleep disorder 04/25/2014   Inguinal hernia 12/21/2013   Diabetes (HCC) 10/11/2013   Routine general medical examination at a health care facility 10/10/2013   Screening for prostate cancer 10/10/2013   Gout 04/26/2008   RECTAL BLEEDING 04/26/2008   CHEST PAIN UNSPECIFIED 04/25/2007   HYPERCHOLESTEROLEMIA 01/26/2007   UNSPECIFIED CHRONIC ISCHEMIC HEART DISEASE 01/26/2007   CERVICAL RADICULOPATHY, RIGHT 01/26/2007   INSOMNIA  01/26/2007    Past Surgical History:  Procedure Laterality Date   COLONOSCOPY     EYE SURGERY     laser surgery bilat    JOINT REPLACEMENT     TOTAL KNEE ARTHROPLASTY Right 06/15/2014   Procedure: RIGHT TOTAL KNEE ARTHROPLASTY;  Surgeon: Lonni CINDERELLA Poli, MD;  Location: [facility];  Service: Orthopedics;  Laterality: Right;   UPPER GI ENDOSCOPY         Home Medications    Prior to Admission medications  Medication Sig Start Date End Date Taking? Authorizing Provider  albuterol  (VENTOLIN  HFA) 108 (90 Base) MCG/ACT inhaler Inhale 2 puffs into the lungs every 6 (six) hours as needed for wheezing or shortness of breath. 07/06/23  Yes Jason Leita Repine, FNP  colchicine  0.6 MG tablet Take two tablets as one dose for gout flare as needed followed one hour later by one tablet. No more than three tablets each day. 04/09/23  Yes Jason Leita Repine, FNP  fenofibrate  (TRICOR ) 145 MG tablet TAKE 1 TABLET BY MOUTH EVERY DAY 10/12/23  Yes Webb, Padonda B, FNP  metFORMIN  (GLUCOPHAGE ) 500 MG tablet TAKE 1 TABLET BY MOUTH 2 TIMES DAILY WITH A MEAL. 10/12/23  Yes Webb, Padonda B, FNP  omeprazole  (PRILOSEC) 20 MG capsule Take 1  capsule (20 mg total) by mouth daily. 04/09/23  Yes Jason Leita Repine, FNP  probenecid  (BENEMID ) 500 MG tablet Take 1 tablet (500 mg total) by mouth 2 (two) times daily. 04/09/23  Yes Jason Leita Repine, FNP  tamsulosin  (FLOMAX ) 0.4 MG CAPS capsule Take 1 capsule (0.4 mg total) by mouth daily. 04/09/23  Yes Jason Leita Repine, FNP  predniSONE  (DELTASONE ) 20 MG tablet Take 1 tablet (20 mg total) by mouth daily with breakfast. Patient not taking: Reported on 08/02/2023 06/09/23   Cyndi Shaver, PA-C  promethazine -dextromethorphan (PROMETHAZINE -DM) 6.25-15 MG/5ML syrup Take 5 mLs by mouth 4 (four) times daily as needed. Patient not taking: Reported on 02/07/2024 06/09/23   Cyndi Shaver, PA-C  rosuvastatin  (CRESTOR ) 5 MG tablet Take 1 tablet (5 mg total) by mouth  daily. Patient not taking: Reported on 02/07/2024 08/25/23   Mona Vinie BROCKS, MD    Family History Family History  Problem Relation Age of Onset   Healthy Mother    Healthy Father    Colon cancer Neg Hx    Esophageal cancer Neg Hx    Stomach cancer Neg Hx    Rectal cancer Neg Hx     Social History Social History[1]   Allergies   Amoxicillin, Aspirin, Penicillins, Allopurinol , and Crestor  [rosuvastatin ]   Review of Systems Review of Systems   Physical Exam Triage Vital Signs ED Triage Vitals  Encounter Vitals Group     BP 02/07/24 0946 133/79     Girls Systolic BP Percentile --      Girls Diastolic BP Percentile --      Boys Systolic BP Percentile --      Boys Diastolic BP Percentile --      Pulse Rate 02/07/24 0946 71     Resp 02/07/24 0946 18     Temp 02/07/24 0946 98.3 F (36.8 C)     Temp Source 02/07/24 0946 Oral     SpO2 02/07/24 0946 95 %     Weight --      Height --      Head Circumference --      Peak Flow --      Pain Score 02/07/24 0923 7     Pain Loc --      Pain Education --      Exclude from Growth Chart --    No data found.  Updated Vital Signs BP 133/79 (BP Location: Left Arm)   Pulse 71   Temp 98.3 F (36.8 C) (Oral)   Resp 18   SpO2 95%   Visual Acuity Right Eye Distance:   Left Eye Distance:   Bilateral Distance:    Right Eye Near:   Left Eye Near:    Bilateral Near:     Physical Exam Vitals and nursing note reviewed.  Constitutional:      General: He is not in acute distress.    Appearance: Normal appearance. He is not ill-appearing, toxic-appearing or diaphoretic.  Eyes:     General: No scleral icterus. Cardiovascular:     Rate and Rhythm: Normal rate and regular rhythm.     Heart sounds: Normal heart sounds.  Pulmonary:     Effort: Pulmonary effort is normal. No respiratory distress.     Breath sounds: Normal breath sounds. No wheezing or rhonchi.  Musculoskeletal:     Right elbow: Swelling present. No deformity,  effusion or lacerations. Normal range of motion. Tenderness present in olecranon process.  Skin:    General: Skin is warm.  Neurological:  Mental Status: He is alert and oriented to person, place, and time.  Psychiatric:        Mood and Affect: Mood normal.        Behavior: Behavior normal.      UC Treatments / Results  Labs (all labs ordered are listed, but only abnormal results are displayed) Labs Reviewed - No data to display  EKG   Radiology No results found.  Procedures Procedures (including critical care time)  Medications Ordered in UC Medications - No data to display  Initial Impression / Assessment and Plan / UC Course  I have reviewed the triage vital signs and the nursing notes.  Pertinent labs & imaging results that were available during my care of the patient were reviewed by me and considered in my medical decision making (see chart for details).     Final Clinical Impressions(s) / UC Diagnoses   Final diagnoses:  Bursitis of right elbow, unspecified bursa     Discharge Instructions      You may use cold compresses to affected area, you may also use Tylenol  or ibuprofen  for symptoms. I do not feel comfortable prescribing NSAIDS due to your aspirin allergy but if you have take OTC ibuprofen  in the past with no issue you are welcome to take that for pain as well. If your symptoms do not improve please keep appt with orthopedist.    ED Prescriptions   None    PDMP not reviewed this encounter.    [1]  Social History Tobacco Use   Smoking status: Never    Passive exposure: Never   Smokeless tobacco: Never  Vaping Use   Vaping status: Never Used  Substance Use Topics   Alcohol use: Yes    Comment: occasional; 1-2 drinks per week per pt   Drug use: No     Andra Corean BROCKS, PA-C 02/07/24 9047  "

## 2024-02-14 ENCOUNTER — Ambulatory Visit: Admitting: Physician Assistant

## 2024-03-06 ENCOUNTER — Ambulatory Visit: Admitting: Physician Assistant

## 2024-03-10 ENCOUNTER — Encounter: Admitting: Physician Assistant
